# Patient Record
Sex: Male | Born: 1945 | Race: White | Hispanic: No | Smoking: Former smoker
Health system: Southern US, Community
[De-identification: ages and names within clinical notes are randomized; demographics above are authoritative.]

## PROBLEM LIST (undated history)

## (undated) DIAGNOSIS — K5790 Diverticulosis of intestine, part unspecified, without perforation or abscess without bleeding: Secondary | ICD-10-CM

## (undated) DIAGNOSIS — N200 Calculus of kidney: Secondary | ICD-10-CM

## (undated) DIAGNOSIS — K449 Diaphragmatic hernia without obstruction or gangrene: Secondary | ICD-10-CM

## (undated) DIAGNOSIS — I10 Essential (primary) hypertension: Secondary | ICD-10-CM

## (undated) DIAGNOSIS — N529 Male erectile dysfunction, unspecified: Secondary | ICD-10-CM

## (undated) DIAGNOSIS — K222 Esophageal obstruction: Secondary | ICD-10-CM

## (undated) DIAGNOSIS — D131 Benign neoplasm of stomach: Secondary | ICD-10-CM

## (undated) DIAGNOSIS — Z9862 Peripheral vascular angioplasty status: Secondary | ICD-10-CM

## (undated) DIAGNOSIS — K219 Gastro-esophageal reflux disease without esophagitis: Secondary | ICD-10-CM

## (undated) DIAGNOSIS — E559 Vitamin D deficiency, unspecified: Secondary | ICD-10-CM

## (undated) DIAGNOSIS — C439 Malignant melanoma of skin, unspecified: Secondary | ICD-10-CM

## (undated) DIAGNOSIS — I2510 Atherosclerotic heart disease of native coronary artery without angina pectoris: Secondary | ICD-10-CM

## (undated) DIAGNOSIS — H9190 Unspecified hearing loss, unspecified ear: Secondary | ICD-10-CM

## (undated) DIAGNOSIS — E785 Hyperlipidemia, unspecified: Secondary | ICD-10-CM

## (undated) DIAGNOSIS — K5732 Diverticulitis of large intestine without perforation or abscess without bleeding: Secondary | ICD-10-CM

## (undated) DIAGNOSIS — I4891 Unspecified atrial fibrillation: Secondary | ICD-10-CM

## (undated) DIAGNOSIS — Z22322 Carrier or suspected carrier of Methicillin resistant Staphylococcus aureus: Secondary | ICD-10-CM

## (undated) DIAGNOSIS — H7190 Unspecified cholesteatoma, unspecified ear: Secondary | ICD-10-CM

## (undated) HISTORY — DX: Peripheral vascular angioplasty status: Z98.62

## (undated) HISTORY — DX: Unspecified hearing loss, unspecified ear: H91.90

## (undated) HISTORY — PX: COLONOSCOPY: SHX174

## (undated) HISTORY — DX: Diverticulosis of intestine, part unspecified, without perforation or abscess without bleeding: K57.90

## (undated) HISTORY — DX: Diverticulitis of large intestine without perforation or abscess without bleeding: K57.32

## (undated) HISTORY — DX: Hyperlipidemia, unspecified: E78.5

## (undated) HISTORY — PX: ESOPHAGOGASTRODUODENOSCOPY: SHX1529

## (undated) HISTORY — DX: Unspecified cholesteatoma, unspecified ear: H71.90

## (undated) HISTORY — DX: Benign neoplasm of stomach: D13.1

## (undated) HISTORY — DX: Diaphragmatic hernia without obstruction or gangrene: K44.9

## (undated) HISTORY — DX: Esophageal obstruction: K22.2

## (undated) HISTORY — DX: Vitamin D deficiency, unspecified: E55.9

## (undated) HISTORY — DX: Malignant melanoma of skin, unspecified: C43.9

## (undated) HISTORY — DX: Carrier or suspected carrier of methicillin resistant Staphylococcus aureus: Z22.322

## (undated) HISTORY — DX: Male erectile dysfunction, unspecified: N52.9

## (undated) HISTORY — DX: Gastro-esophageal reflux disease without esophagitis: K21.9

## (undated) HISTORY — DX: Essential (primary) hypertension: I10

## (undated) HISTORY — DX: Unspecified atrial fibrillation: I48.91

## (undated) HISTORY — PX: TONSILLECTOMY AND ADENOIDECTOMY: SUR1326

## (undated) HISTORY — DX: Atherosclerotic heart disease of native coronary artery without angina pectoris: I25.10

## (undated) HISTORY — DX: Calculus of kidney: N20.0

---

## 1998-02-28 HISTORY — PX: CORONARY ANGIOPLASTY WITH STENT PLACEMENT: SHX49

## 2003-07-15 ENCOUNTER — Encounter: Admission: RE | Admit: 2003-07-15 | Discharge: 2003-07-15 | Payer: Self-pay | Admitting: Family Medicine

## 2003-08-08 ENCOUNTER — Encounter: Admission: RE | Admit: 2003-08-08 | Discharge: 2003-08-08 | Payer: Self-pay | Admitting: Internal Medicine

## 2005-12-30 ENCOUNTER — Encounter: Admission: RE | Admit: 2005-12-30 | Discharge: 2005-12-30 | Payer: Self-pay | Admitting: Otolaryngology

## 2007-02-15 ENCOUNTER — Encounter: Admission: RE | Admit: 2007-02-15 | Discharge: 2007-02-15 | Payer: Self-pay | Admitting: Otolaryngology

## 2008-09-17 ENCOUNTER — Encounter (INDEPENDENT_AMBULATORY_CARE_PROVIDER_SITE_OTHER): Payer: Self-pay | Admitting: *Deleted

## 2008-12-24 ENCOUNTER — Ambulatory Visit: Payer: Self-pay | Admitting: Cardiology

## 2008-12-24 DIAGNOSIS — E785 Hyperlipidemia, unspecified: Secondary | ICD-10-CM

## 2008-12-24 DIAGNOSIS — I10 Essential (primary) hypertension: Secondary | ICD-10-CM

## 2008-12-24 DIAGNOSIS — I2510 Atherosclerotic heart disease of native coronary artery without angina pectoris: Secondary | ICD-10-CM

## 2008-12-31 ENCOUNTER — Telehealth (INDEPENDENT_AMBULATORY_CARE_PROVIDER_SITE_OTHER): Payer: Self-pay

## 2009-01-01 ENCOUNTER — Encounter (HOSPITAL_COMMUNITY): Admission: RE | Admit: 2009-01-01 | Discharge: 2009-02-25 | Payer: Self-pay | Admitting: Cardiology

## 2009-01-01 ENCOUNTER — Ambulatory Visit: Payer: Self-pay

## 2009-01-01 ENCOUNTER — Ambulatory Visit: Payer: Self-pay | Admitting: Internal Medicine

## 2009-02-28 HISTORY — PX: COLON SURGERY: SHX602

## 2009-05-29 ENCOUNTER — Encounter: Admission: RE | Admit: 2009-05-29 | Discharge: 2009-05-29 | Payer: Self-pay | Admitting: Family Medicine

## 2009-05-29 ENCOUNTER — Inpatient Hospital Stay (HOSPITAL_COMMUNITY): Admission: EM | Admit: 2009-05-29 | Discharge: 2009-05-31 | Payer: Self-pay | Admitting: Emergency Medicine

## 2009-06-05 ENCOUNTER — Encounter: Admission: RE | Admit: 2009-06-05 | Discharge: 2009-06-05 | Payer: Self-pay | Admitting: General Surgery

## 2009-06-23 ENCOUNTER — Encounter: Admission: RE | Admit: 2009-06-23 | Discharge: 2009-06-23 | Payer: Self-pay | Admitting: General Surgery

## 2009-07-29 DIAGNOSIS — Z22322 Carrier or suspected carrier of Methicillin resistant Staphylococcus aureus: Secondary | ICD-10-CM

## 2009-07-29 HISTORY — DX: Carrier or suspected carrier of methicillin resistant Staphylococcus aureus: Z22.322

## 2009-08-07 ENCOUNTER — Inpatient Hospital Stay (HOSPITAL_COMMUNITY): Admission: RE | Admit: 2009-08-07 | Discharge: 2009-08-11 | Payer: Self-pay | Admitting: General Surgery

## 2009-08-07 ENCOUNTER — Encounter (INDEPENDENT_AMBULATORY_CARE_PROVIDER_SITE_OTHER): Payer: Self-pay | Admitting: General Surgery

## 2009-11-24 ENCOUNTER — Encounter: Admission: RE | Admit: 2009-11-24 | Discharge: 2009-11-24 | Payer: Self-pay | Admitting: Otolaryngology

## 2009-12-29 LAB — HM MAMMOGRAPHY

## 2010-01-20 ENCOUNTER — Telehealth (INDEPENDENT_AMBULATORY_CARE_PROVIDER_SITE_OTHER): Payer: Self-pay | Admitting: *Deleted

## 2010-02-28 HISTORY — PX: MELANOMA EXCISION: SHX5266

## 2010-04-01 NOTE — Progress Notes (Signed)
Summary: Records Request  Faxed Stress to [facility] at [facility] ([phone]). Debby Freiberg  January 20, 2010 9:30 AM

## 2010-05-17 LAB — CBC
HCT: 45.5 % (ref 39.0–52.0)
MCHC: 33.7 g/dL (ref 30.0–36.0)
MCV: 93.1 fL (ref 78.0–100.0)
Platelets: 181 10*3/uL (ref 150–400)
RBC: 4.01 MIL/uL — ABNORMAL LOW (ref 4.22–5.81)
RDW: 14.1 % (ref 11.5–15.5)
RDW: 14.8 % (ref 11.5–15.5)
WBC: 9.3 10*3/uL (ref 4.0–10.5)

## 2010-05-17 LAB — DIFFERENTIAL
Basophils Relative: 0 % (ref 0–1)
Eosinophils Relative: 0 % (ref 0–5)
Eosinophils Relative: 1 % (ref 0–5)
Lymphocytes Relative: 29 % (ref 12–46)
Lymphocytes Relative: 7 % — ABNORMAL LOW (ref 12–46)
Lymphs Abs: 0.9 10*3/uL (ref 0.7–4.0)
Lymphs Abs: 2.7 10*3/uL (ref 0.7–4.0)
Monocytes Absolute: 0.7 10*3/uL (ref 0.1–1.0)
Monocytes Relative: 7 % (ref 3–12)
Neutro Abs: 5.9 10*3/uL (ref 1.7–7.7)
Neutrophils Relative %: 63 % (ref 43–77)
Neutrophils Relative %: 86 % — ABNORMAL HIGH (ref 43–77)

## 2010-05-17 LAB — COMPREHENSIVE METABOLIC PANEL
ALT: 40 U/L (ref 0–53)
Alkaline Phosphatase: 54 U/L (ref 39–117)
Calcium: 10 mg/dL (ref 8.4–10.5)
Chloride: 100 mEq/L (ref 96–112)
GFR calc Af Amer: >60 mL/min (ref >60)
Potassium: 3.9 mEq/L (ref 3.5–5.1)
Sodium: 138 mEq/L (ref 135–145)
Total Bilirubin: 1 mg/dL (ref 0.3–1.2)

## 2010-05-17 LAB — BASIC METABOLIC PANEL
BUN: 7 mg/dL (ref 6–23)
CO2: 30 mEq/L (ref 19–32)
Calcium: 9.1 mg/dL (ref 8.4–10.5)
Creatinine, Ser: 0.79 mg/dL (ref 0.4–1.5)
Potassium: 4.1 mEq/L (ref 3.5–5.1)
Sodium: 141 mEq/L (ref 135–145)

## 2010-05-17 LAB — SURGICAL PCR SCREEN: Staphylococcus aureus: POSITIVE — AB

## 2010-05-19 LAB — URINALYSIS, ROUTINE W REFLEX MICROSCOPIC
Glucose, UA: NEGATIVE mg/dL
Protein, ur: NEGATIVE mg/dL
Specific Gravity, Urine: >1.046 — ABNORMAL HIGH (ref 1.005–1.030)
Urobilinogen, UA: 0.2 mg/dL (ref 0.0–1.0)
pH: 5 (ref 5.0–8.0)

## 2010-05-19 LAB — CBC
HCT: 38.5 % — ABNORMAL LOW (ref 39.0–52.0)
HCT: 42.8 % (ref 39.0–52.0)
Hemoglobin: 14.6 g/dL (ref 13.0–17.0)
MCHC: 34.2 g/dL (ref 30.0–36.0)
MCV: 92.6 fL (ref 78.0–100.0)
Platelets: 263 10*3/uL (ref 150–400)
Platelets: 292 10*3/uL (ref 150–400)
RBC: 4.67 MIL/uL (ref 4.22–5.81)
RDW: 13 % (ref 11.5–15.5)

## 2010-05-19 LAB — BASIC METABOLIC PANEL
BUN: 8 mg/dL (ref 6–23)
CO2: 27 mEq/L (ref 19–32)
Chloride: 101 mEq/L (ref 96–112)
GFR calc Af Amer: >60 mL/min (ref >60)
Glucose, Bld: 146 mg/dL — ABNORMAL HIGH (ref 70–99)
Potassium: 4.2 mEq/L (ref 3.5–5.1)
Sodium: 134 mEq/L — ABNORMAL LOW (ref 135–145)

## 2010-05-19 LAB — COMPREHENSIVE METABOLIC PANEL
BUN: 9 mg/dL (ref 6–23)
CO2: 23 mEq/L (ref 19–32)
Calcium: 9.8 mg/dL (ref 8.4–10.5)
Chloride: 102 mEq/L (ref 96–112)
Creatinine, Ser: 0.86 mg/dL (ref 0.4–1.5)

## 2010-05-19 LAB — DIFFERENTIAL
Basophils Absolute: 0 10*3/uL (ref 0.0–0.1)
Basophils Relative: 0 % (ref 0–1)
Eosinophils Absolute: 0 10*3/uL (ref 0.0–0.7)
Eosinophils Absolute: 0.1 10*3/uL (ref 0.0–0.7)
Eosinophils Relative: 0 % (ref 0–5)
Eosinophils Relative: 1 % (ref 0–5)
Lymphocytes Relative: 15 % (ref 12–46)
Lymphs Abs: 1.3 10*3/uL (ref 0.7–4.0)
Lymphs Abs: 2 10*3/uL (ref 0.7–4.0)
Monocytes Absolute: 1 10*3/uL (ref 0.1–1.0)
Monocytes Relative: 7 % (ref 3–12)
Neutro Abs: 10.6 10*3/uL — ABNORMAL HIGH (ref 1.7–7.7)

## 2010-05-19 LAB — CULTURE, BLOOD (ROUTINE X 2): Culture: NO GROWTH

## 2010-05-20 ENCOUNTER — Ambulatory Visit: Payer: Self-pay | Admitting: Physical Therapy

## 2010-06-04 ENCOUNTER — Other Ambulatory Visit: Payer: Self-pay

## 2010-07-05 ENCOUNTER — Encounter: Payer: Self-pay | Admitting: Family Medicine

## 2011-03-01 DIAGNOSIS — C439 Malignant melanoma of skin, unspecified: Secondary | ICD-10-CM

## 2011-03-01 HISTORY — DX: Malignant melanoma of skin, unspecified: C43.9

## 2011-10-07 ENCOUNTER — Encounter: Payer: Self-pay | Admitting: Internal Medicine

## 2011-11-09 ENCOUNTER — Ambulatory Visit (INDEPENDENT_AMBULATORY_CARE_PROVIDER_SITE_OTHER): Payer: BC Managed Care – PPO | Admitting: Internal Medicine

## 2011-11-09 ENCOUNTER — Encounter: Payer: Self-pay | Admitting: Internal Medicine

## 2011-11-09 VITALS — BP 132/78 | HR 76 | Ht 71.0 in | Wt 200.8 lb

## 2011-11-09 DIAGNOSIS — R195 Other fecal abnormalities: Secondary | ICD-10-CM

## 2011-11-09 DIAGNOSIS — D509 Iron deficiency anemia, unspecified: Secondary | ICD-10-CM

## 2011-11-09 MED ORDER — NA SULFATE-K SULFATE-MG SULF 17.5-3.13-1.6 GM/177ML PO SOLN: ORAL | Status: DC

## 2011-11-09 MED ORDER — FERROUS SULFATE 325 (65 FE) MG PO TABS: 325.0000 mg | ORAL_TABLET | Freq: Every day | ORAL | Status: DC

## 2011-11-09 NOTE — Progress Notes (Signed)
Subjective:    Patient ID: Maurice Weaver, male    DOB: Sep 05, 1945, 66 y.o.   MRN: [id number]  HPI This pleasant elderly white man is here today because of anemia and heme positive stool. She was found to have a hemoglobin of 11.9 this summer, I ferritin was 5, MCV 78. He had an immune fecal occult blood test that was positive. He denies any rectal bleeding or melena. He has been doing well other than his chronic hearing loss. He continues to work. He has not yet started on iron. He does not eat much red meat. He is not a blood donor. He is on a daily aspirin as well as omeprazole. He does not report nonsteroidal.   He does have urgent postprandial defecation and loose stools chronically ever since a left sided colon resection for diverticulitis and abscess. He says this does not impair his quality of life.  GI ROS otherwise negative. Allergies  Allergen Reactions  . Ampicillin Nausea Only   Outpatient Prescriptions Prior to Visit  Medication Sig Dispense Refill  . aspirin (LONGS ADULT LOW STRENGTH ASA) 81 MG EC tablet Take 81 mg by mouth daily.        . cholecalciferol (VITAMIN D) 1000 UNITS tablet Take 1,000 Units by mouth daily.        Marland Kitchen ezetimibe (ZETIA) 10 MG tablet Take 10 mg by mouth at bedtime.        . hydrochlorothiazide 25 MG tablet Take 25 mg by mouth daily.        . metoprolol (TOPROL XL) 100 MG 24 hr tablet Take 100 mg by mouth daily.        Marland Kitchen omeprazole (PRILOSEC) 10 MG capsule Take 10 mg by mouth at bedtime.        . potassium chloride (KLOR-CON) 10 MEQ CR tablet Take 10 mEq by mouth daily.        . ramipril (ALTACE) 10 MG capsule Take 10 mg by mouth daily.        . rosuvastatin (CRESTOR) 20 MG tablet Take 20 mg by mouth at bedtime.        Marland Kitchen zolpidem (AMBIEN) 10 MG tablet Take 10 mg by mouth at bedtime as needed.         Past Medical History  Diagnosis Date  . Hiatal hernia   . Diverticulosis   . Hyperlipidemia   . AF (atrial fibrillation)   . Asthma     stable    . Decreased hearing   . Nephrolithiasis   . CAD (coronary artery disease)   . ED (erectile dysfunction)   . Elevated BP   . History of ETT 12/1993  . Decreased radial pulse   . Cholesteatoma   . S/P angioplasty   . GERD (gastroesophageal reflux disease)   . Fundic gland polyps of stomach, benign   . Schatzki's ring   . MRSA (methicillin resistant staph aureus) culture positive 07/2009  . Diverticulitis of colon     recurrent w/abscess - resected 2011  . Melanoma    Past Surgical History  Procedure Date  . Colonoscopy 2005  . Esophagogastroduodenoscopy 2005  . Tonsillectomy and adenoidectomy   . Coronary angioplasty with stent placement 2000  . Inner ear surgery     x5 cholesteatomas  . Colon surgery 2011    diverticulitis left resection-Toth  . Melanoma excision 2012   History   Social History  . Marital Status: Divorced    Spouse Name: N/A  Number of Children: N/A  . Years of Education: N/A   Occupational History  . Electronics    Social History Main Topics  . Smoking status: Former Games developer  . Smokeless tobacco: Never Used  . Alcohol Use: No  . Drug Use: No  . Sexually Active: None   Other Topics Concern  . None   Social History Narrative   Divorced and lives aloneDoes have a daughter in the areaEmployed at T/E electronicsNo caffeine as of 11/09/2011   Family History  Problem Relation Age of Onset  . Colon cancer Neg Hx   . Pancreatic cancer Mother   . Heart disease Father      Review of Systems This is positive for chronically reduced hearing, he says he is about 50% hearing ability in the last year and nontender right. All other review of systems are reviewed and are negative except as mentioned in the history of present illness.    Objective:   Physical Exam General:  Well-developed, well-nourished and in no acute distress Eyes:  anicteric. ENT:   Mouth and posterior pharynx free of lesions.  Neck:   supple w/o thyromegaly or mass.   Lungs: Clear to auscultation bilaterally. Heart:  S1S2, no rubs, murmurs, gallops. Abdomen:  soft, non-tender, no hepatosplenomegaly, hernia, or mass and BS+.  Rectal: Deferred until colonoscopy Lymph:  no cervical or supraclavicular adenopathy. Extremities:   no edema Skin   no rash. Neuro:  A&O x 3. He does have reduced hearing, death on the right Psych:  appropriate mood and  Affect.   Data Reviewed: Studies as reported above these are from July 2013. A sick metabolic panel was normal. 2005 EGD showed hiatal hernia and fundic gland polyps Colonoscopy then showed diverticulosis and internal hemorrhoids    Assessment & Plan:   1. Heme positive stool-iFOBT   2. Iron deficiency anemia    1. Etiology not clear. With the heme positive stool certainly possible that he has a chronic occult blood loss anemia. 2. Plan for colonoscopy, would do that exam first and if does not explain his problem upper endoscopy will be done. Will schedule for both.The risks and benefits as well as alternatives of endoscopic procedure(s) have been discussed and reviewed. All questions answered. The patient agrees to proceed. 3. Start ferrous sulfate 325 mg daily 4. He is very busy at work and needs to wait a few weeks before scheduling, we'll schedule this for October 5. Anticipate followup hemoglobin then 6. If EGD and colonoscopy are unrevealing, only to decide versus supplementation and followup or proceeding with a capsule endoscopy. We briefly discussed this today but no decisions are made.  I appreciate the opportunity to care for this patient.   CC: Rudi Heap, MD

## 2011-11-09 NOTE — Patient Instructions (Addendum)
You have been scheduled for an endoscopy and colonoscopy with propofol. Please follow the written instructions given to you at your visit today. Please pick up your prep at the pharmacy within the next 1-3 days. If you use inhalers (even only as needed), please bring them with you on the day of your procedure.  Please start Ferrous Sulfate 325 mg , take one every day.  This is over the counter.  Thank you for choosing me and [facility].  Iva Boop, M.D., [facility]

## 2011-12-29 ENCOUNTER — Encounter: Payer: Self-pay | Admitting: Internal Medicine

## 2011-12-29 ENCOUNTER — Ambulatory Visit (AMBULATORY_SURGERY_CENTER): Payer: BC Managed Care – PPO | Admitting: Internal Medicine

## 2011-12-29 VITALS — BP 135/76 | HR 67 | Temp 98.3°F | Resp 27 | Ht 71.0 in | Wt 200.0 lb

## 2011-12-29 DIAGNOSIS — D509 Iron deficiency anemia, unspecified: Secondary | ICD-10-CM

## 2011-12-29 DIAGNOSIS — D126 Benign neoplasm of colon, unspecified: Secondary | ICD-10-CM

## 2011-12-29 DIAGNOSIS — K2990 Gastroduodenitis, unspecified, without bleeding: Secondary | ICD-10-CM

## 2011-12-29 DIAGNOSIS — K2970 Gastritis, unspecified, without bleeding: Secondary | ICD-10-CM

## 2011-12-29 DIAGNOSIS — R195 Other fecal abnormalities: Secondary | ICD-10-CM

## 2011-12-29 MED ORDER — SODIUM CHLORIDE 0.9 % IV SOLN: 500.0000 mL | INTRAVENOUS | Status: DC

## 2011-12-29 MED ORDER — OMEPRAZOLE 20 MG PO CPDR: 20.0000 mg | DELAYED_RELEASE_CAPSULE | Freq: Every day | ORAL | Status: DC

## 2011-12-29 NOTE — Progress Notes (Signed)
1102 a/ox3, pleased, report to [employer]

## 2011-12-29 NOTE — Op Note (Signed)
[facility] [address] [place], [address]   ENDOSCOPY PROCEDURE REPORT  PATIENT: Teal, Bontrager  MR#: [id number] BIRTHDATE: 15-Feb-1946 , 65  yrs. old GENDER: Male ENDOSCOPIST: Iva Boop, MD, [facility] REFERRED BY:  Rudi Heap, M.D. PROCEDURE DATE:  12/29/2011 PROCEDURE:  EGD w/ biopsy ASA CLASS:     Class II INDICATIONS:  iron deficiency anemia. MEDICATIONS: There was residual sedation effect present from prior procedure, propofol (Diprivan) 50mg  IV, MAC sedation, administered by CRNA, and These medications were titrated to patient response per physician's verbal order TOPICAL ANESTHETIC: Cetacaine Spray  DESCRIPTION OF PROCEDURE: After the risks benefits and alternatives of the procedure were thoroughly explained, informed consent was obtained.  The [facility] [id number] endoscope was introduced through the mouth and advanced to the second portion of the duodenum. Without limitations.  The instrument was slowly withdrawn as the mucosa was fully examined.      STOMACH: Mild erosive gastritis (inflammation) was found in the gastric antrum. Linear erythema with subtle mucosal disruption. Multiple biopsies were performed using cold forceps.  Sample sent for histology.  The remainder of the upper endoscopy exam was otherwise normal. Retroflexed views revealed no abnormalities.     The scope was then withdrawn from the patient and the procedure completed.  COMPLICATIONS: There were no complications. ENDOSCOPIC IMPRESSION: 1.   Erosive gastritis (inflammation) was found in the gastric antrum; multiple biopsies 2.   The remainder of the upper endoscopy exam was otherwise normal  RECOMMENDATIONS: 1.  continue PPI but increase to 20 mg omeprazole daily 2.  Await pathology results 3.  Follow-up with Dr. Christell Constant re: anemia    eSigned:  Iva Boop, MD, [facility] 12/29/2011 11:07 AM PP:IRJJOA Christell Constant, MD and The Patient

## 2011-12-29 NOTE — Patient Instructions (Addendum)
There was a very tiny polyp removed from the colon and some small internal hemorrhoids. Otherwise ok.  The upper endoscopy showed an inflamed stomach - called gastritis. Biopsies were taken to understand it better. i am going to increase your dose of omeprazole to 20 mg daily.   I will let you know the results and plans via phone call or letter.  Please follow-up with Dr. Kathi Der office regarding your anemia.  Thank you for choosing me and [facility].  Iva Boop, MD, FACG YOU HAD AN ENDOSCOPIC PROCEDURE TODAY AT THE [facility]: Refer to the procedure report that was given to you for any specific questions about what was found during the examination.  If the procedure report does not answer your questions, please call your gastroenterologist to clarify.  If you requested that your care partner not be given the details of your procedure findings, then the procedure report has been included in a sealed envelope for you to review at your convenience later.  YOU SHOULD EXPECT: Some feelings of bloating in the abdomen. Passage of more gas than usual.  Walking can help get rid of the air that was put into your GI tract during the procedure and reduce the bloating. If you had a lower endoscopy (such as a colonoscopy or flexible sigmoidoscopy) you may notice spotting of blood in your stool or on the toilet paper. If you underwent a bowel prep for your procedure, then you may not have a normal bowel movement for a few days.  DIET: Your first meal following the procedure should be a light meal and then it is ok to progress to your normal diet.  A half-sandwich or bowl of soup is an example of a good first meal.  Heavy or fried foods are harder to digest and may make you feel nauseous or bloated.  Likewise meals heavy in dairy and vegetables can cause extra gas to form and this can also increase the bloating.  Drink plenty of fluids but you should avoid alcoholic beverages for 24  hours.  ACTIVITY: Your care partner should take you home directly after the procedure.  You should plan to take it easy, moving slowly for the rest of the day.  You can resume normal activity the day after the procedure however you should NOT DRIVE or use heavy machinery for 24 hours (because of the sedation medicines used during the test).    SYMPTOMS TO REPORT IMMEDIATELY: A gastroenterologist can be reached at any hour.  During normal business hours, 8:30 AM to 5:00 PM Monday through Friday, call [phone].  After hours and on weekends, please call the GI answering service at [phone] who will take a message and have the physician on call contact you.   Following lower endoscopy (colonoscopy or flexible sigmoidoscopy):  Excessive amounts of blood in the stool  Significant tenderness or worsening of abdominal pains  Swelling of the abdomen that is new, acute  Fever of 100F or higher  Following upper endoscopy (EGD)  Vomiting of blood or coffee ground material  New chest pain or pain under the shoulder blades  Painful or persistently difficult swallowing  New shortness of breath  Fever of 100F or higher  Black, tarry-looking stools  FOLLOW UP: If any biopsies were taken you will be contacted by phone or by letter within the next 1-3 weeks.  Call your gastroenterologist if you have not heard about the biopsies in 3 weeks.  Our staff will call  the home number listed on your records the next business day following your procedure to check on you and address any questions or concerns that you may have at that time regarding the information given to you following your procedure. This is a courtesy call and so if there is no answer at the home number and we have not heard from you through the emergency physician on call, we will assume that you have returned to your regular daily activities without incident.  SIGNATURES/CONFIDENTIALITY: You and/or your care partner have signed  paperwork which will be entered into your electronic medical record.  These signatures attest to the fact that that the information above on your After Visit Summary has been reviewed and is understood.  Full responsibility of the confidentiality of this discharge information lies with you and/or your care-partner.

## 2011-12-29 NOTE — Progress Notes (Addendum)
Patient did not have preoperative order for IV antibiotic SSI prophylaxis. (G8918)  Patient did not experience any of the following events: a burn prior to discharge; a fall within the facility; wrong site/side/patient/procedure/implant event; or a hospital transfer or hospital admission upon discharge from the facility. (G8907)  

## 2011-12-29 NOTE — Op Note (Addendum)
[facility] [address] [place], [address]   COLONOSCOPY PROCEDURE REPORT  PATIENT: Maurice, Weaver  MR#: [id number] BIRTHDATE: Feb 21, 1946 , 65  yrs. old GENDER: Male ENDOSCOPIST: Iva Boop, MD, [facility] REFERRED XB:JYNWGN Christell Constant, M.D. PROCEDURE DATE:  12/29/2011 PROCEDURE:   Colonoscopy with biopsy ASA CLASS:   Class II INDICATIONS:iron deficiency anemia and heme-positive stool. MEDICATIONS: propofol (Diprivan) 150mg  IV, MAC sedation, administered by CRNA, and These medications were titrated to patient response per physician's verbal order  DESCRIPTION OF PROCEDURE:   After the risks benefits and alternatives of the procedure were thoroughly explained, informed consent was obtained.  A digital rectal exam revealed no abnormalities of the rectum and A digital rectal exam revealed the prostate was not enlarged.   The LB [id number]  endoscope was introduced through the anus and advanced to the terminal ileum which was intubated for a short distance. No adverse events experienced.   The quality of the prep was Suprep excellent  The instrument was then slowly withdrawn as the colon was fully examined.      COLON FINDINGS: A diminutive polypoid shaped sessile polyp with a friable surface was found in the sigmoid colon.  A polypectomy was performed with cold forceps.  The resection was complete and the polyp tissue was completely retrieved.   There was evidence of a prior end-to-end colo-colonic surgical anastomosis in the sigmoid colon.   Small internal hemorrhoids were found.   The colon mucosa was otherwise normal and so was terminal ileum.  Retroflexed views revealed internal hemorrhoids. The time to cecum=1 minutes 05 seconds.  Withdrawal time=7 minutes 11 seconds.  The scope was withdrawn and the procedure completed. COMPLICATIONS: There were no complications.  ENDOSCOPIC IMPRESSION: 1.   Diminutive sessile polyp was found in the sigmoid  colon; polypectomy was performed with cold forceps 2.   There was evidence of a prior colo-colonic surgical anastomosis in the sigmoid colon 3.   Small internal hemorrhoids 4.   The colon mucosa and terminal ileum were otherwise normal - excellent prep  RECOMMENDATIONS: 1.  Upper endoscopy will be scheduled 2.  Timing of repeat colonoscopy will be determined by pathology findings.   eSigned:  Iva Boop, MD, [facility] 12/29/2011 11:39 AMRevised: 12/29/2011 11:39 AM cc: Rudi Heap, MD and The Patient

## 2011-12-30 ENCOUNTER — Telehealth: Payer: Self-pay | Admitting: *Deleted

## 2011-12-30 ENCOUNTER — Telehealth: Payer: Self-pay

## 2011-12-30 MED ORDER — OMEPRAZOLE 40 MG PO CPDR: 40.0000 mg | DELAYED_RELEASE_CAPSULE | Freq: Every day | ORAL | Status: DC

## 2011-12-30 NOTE — Telephone Encounter (Signed)
We need to change his omeprazole to 40 mg daily - I thought he was on 10 and went to 20 so please cancel the 20 Rx and do 40 mg daily x 1 year

## 2011-12-30 NOTE — Telephone Encounter (Signed)
  Follow up Call-  Call back number 12/29/2011  Post procedure Call Back phone  # [phone]  Permission to leave phone message Yes     Patient questions:  Do you have a fever, pain , or abdominal swelling? no Pain Score  0 *  Have you tolerated food without any problems? yes  Have you been able to return to your normal activities? yes  Do you have any questions about your discharge instructions: Diet   no Medications  no Follow up visit  no  Do you have questions or concerns about your Care? no  Actions: * If pain score is 4 or above: No action needed, pain <4.  Dr.Gessner, Patient states his current dose of omeprazole that he is taking is 20 mg. Patient wants you to know. Thanks .

## 2011-12-30 NOTE — Telephone Encounter (Signed)
Informed patient of omeprazole change in dosage.  He wants it sent to [facility] so will cancel the [facility] order and resend.  Cancelled the [facility] rx with Kerri at 12:20pm.

## 2012-01-03 NOTE — Progress Notes (Signed)
Quick Note:  Mild gastritis Granuloma polyp  ______

## 2012-06-13 ENCOUNTER — Encounter: Payer: Self-pay | Admitting: Family Medicine

## 2012-06-13 ENCOUNTER — Ambulatory Visit (INDEPENDENT_AMBULATORY_CARE_PROVIDER_SITE_OTHER): Payer: Medicare Other | Admitting: Family Medicine

## 2012-06-13 VITALS — BP 146/91 | HR 71 | Temp 97.3°F | Ht 71.0 in | Wt 196.0 lb

## 2012-06-13 DIAGNOSIS — E291 Testicular hypofunction: Secondary | ICD-10-CM

## 2012-06-13 DIAGNOSIS — E559 Vitamin D deficiency, unspecified: Secondary | ICD-10-CM

## 2012-06-13 DIAGNOSIS — I10 Essential (primary) hypertension: Secondary | ICD-10-CM

## 2012-06-13 DIAGNOSIS — E349 Endocrine disorder, unspecified: Secondary | ICD-10-CM

## 2012-06-13 DIAGNOSIS — E785 Hyperlipidemia, unspecified: Secondary | ICD-10-CM

## 2012-06-13 DIAGNOSIS — H9190 Unspecified hearing loss, unspecified ear: Secondary | ICD-10-CM | POA: Insufficient documentation

## 2012-06-13 DIAGNOSIS — D0330 Melanoma in situ of unspecified part of face: Secondary | ICD-10-CM | POA: Insufficient documentation

## 2012-06-13 DIAGNOSIS — I2510 Atherosclerotic heart disease of native coronary artery without angina pectoris: Secondary | ICD-10-CM

## 2012-06-13 DIAGNOSIS — D509 Iron deficiency anemia, unspecified: Secondary | ICD-10-CM

## 2012-06-13 DIAGNOSIS — C4330 Malignant melanoma of unspecified part of face: Secondary | ICD-10-CM

## 2012-06-13 DIAGNOSIS — H9193 Unspecified hearing loss, bilateral: Secondary | ICD-10-CM

## 2012-06-13 DIAGNOSIS — D0339 Melanoma in situ of other parts of face: Secondary | ICD-10-CM

## 2012-06-13 LAB — HEPATIC FUNCTION PANEL
ALT: 18 U/L (ref 0–53)
AST: 19 U/L (ref 0–37)
Albumin: 4.6 g/dL (ref 3.5–5.2)
Alkaline Phosphatase: 60 U/L (ref 39–117)
Total Protein: 7 g/dL (ref 6.0–8.3)

## 2012-06-13 LAB — BASIC METABOLIC PANEL WITH GFR
BUN: 16 mg/dL (ref 6–23)
CO2: 26 mEq/L (ref 19–32)
Chloride: 100 mEq/L (ref 96–112)
Creat: 0.94 mg/dL (ref 0.50–1.35)
GFR, Est Non African American: 84 mL/min
Glucose, Bld: 90 mg/dL (ref 70–99)

## 2012-06-13 LAB — POCT CBC
HCT, POC: 44.4 % (ref 43.5–53.7)
Lymph, poc: 2.4 (ref 0.6–3.4)
MCH, POC: 31.8 pg — AB (ref 27–31.2)
MCHC: 34.4 g/dL (ref 31.8–35.4)
MPV: 7.3 fL (ref 0–99.8)
POC Granulocyte: 4.4 (ref 2–6.9)
POC LYMPH PERCENT: 32.8 %L (ref 10–50)
RDW, POC: 12.6 %
WBC: 7.3 10*3/uL (ref 4.6–10.2)

## 2012-06-13 NOTE — Progress Notes (Signed)
  Subjective:    Patient ID: Maurice Weaver, male    DOB: 1945-07-13, 67 y.o.   MRN: [id number]  HPI Patient had an endoscopy in October and was found to have erosive gastritis. He also had a colonoscopy. Based on the path report and the lack of family history of colon cancer he probably will need his next colonoscopy in 10 years.   Review of Systems  Constitutional: Negative.   HENT: Positive for hearing loss (Chronic-treated by Dr. Jenne Pane). Negative for ear pain, congestion and rhinorrhea.   Eyes: Negative.   Respiratory: Negative.   Cardiovascular: Negative.   Gastrointestinal: Negative.  Negative for diarrhea (Resolved from previous visit).  Endocrine: Negative.   Genitourinary: Negative.   Musculoskeletal: Negative.   Skin: Negative.   Allergic/Immunologic: Negative.   Neurological: Negative.   Hematological: Negative.   Psychiatric/Behavioral: Negative.        Objective:   Physical Exam BP 146/91  Pulse 71  Temp(Src) 97.3 F (36.3 C) (Oral)  Ht 5\' 11"  (1.803 m)  Wt 196 lb (88.905 kg)  BMI 27.35 kg/m2  The patient appeared well nourished and normally developed, alert and oriented to time and place. Speech, behavior and judgement appear normal. Diminished hearing bilaterally Vital signs as documented. Patient sees Dr. Jenne Pane regularly for his hearing. Head exam is unremarkable. No scleral icterus or pallor noted. There is some head congestion bilateral.  Neck is without jugular venous distension, thyromegally, or carotid bruits. Carotid upstrokes are brisk bilaterally. No cervical adenopathy. Lungs are clear anteriorly and posteriorly to auscultation. Normal respiratory effort. Cardiac exam reveals regular rate and rhythm @ 72/min. First and second heart sounds normal. No murmurs, rubs or gallops.  Abdominal exam reveals normal bowl sounds, no masses, no organomegaly and no aortic enlargement. No inguinal adenopathy. Extremities are nonedematous and both femoral and  pedal pulses are normal. There are some varicose veins bilaterally in the lower extremities Skin without pallor or jaundice.  Warm and dry, without rash. Scar noted on right cheek secondary to melanoma removal. Neurologic exam reveals normal deep tendon reflexes and normal sensation.   Repeat blood pressure was 130/84          Assessment & Plan:  1. Melanoma in situ of cheek Dr. Terri Piedra follows up on the [facility]   2. Hearing deficit, bilateral Dr. Jenne Pane follows him for his hearing issue  3. Coronary atherosclerosis of native coronary artery He is having no chest pain or chest tightness or shortness of breath with exertion  4. Hyperlipidemia Labs will be drawn today  5. Vitamin D deficient  6.hypertension     Labs are being drawn to monitor these above condition;  CBC, ferritin, BMP, NMR, liver function test, and vitamin D. Also testosterone level

## 2012-06-13 NOTE — Patient Instructions (Addendum)
Exercise regularly Fall prevention Continue meds as doing We will see back in the office in about 4 month and we will call you with lab results from this visit Check blood pressures regularly at home and bring these to the next visit

## 2012-06-13 NOTE — Progress Notes (Signed)
Spoke with Delice Bison and gave normal results to her and also left a message for [employer]

## 2012-06-14 LAB — NMR LIPOPROFILE WITH LIPIDS
Cholesterol, Total: 120 mg/dL (ref <200)
HDL Particle Number: 27.7 umol/L — ABNORMAL LOW (ref >=30.5)
LDL Size: 19.9 nm — ABNORMAL LOW (ref >20.5)
Large HDL-P: 2.9 umol/L — ABNORMAL LOW (ref >=4.8)
Large VLDL-P: 2.8 nmol/L — ABNORMAL HIGH (ref <=2.7)
Small LDL Particle Number: 892 nmol/L — ABNORMAL HIGH (ref <=527)

## 2012-06-14 LAB — VITAMIN D 25 HYDROXY (VIT D DEFICIENCY, FRACTURES): Vit D, 25-Hydroxy: 29 ng/mL — ABNORMAL LOW (ref 30–89)

## 2012-06-21 ENCOUNTER — Telehealth: Payer: Self-pay | Admitting: *Deleted

## 2012-06-21 NOTE — Telephone Encounter (Signed)
Message copied by Bearl Mulberry on Thu Jun 21, 2012  6:45 PM ------      Message from: Ernestina Penna      Created: Thu Jun 21, 2012 12:15 PM       Do AndroGel 1.62% one pump each arm daily ------

## 2012-06-21 NOTE — Telephone Encounter (Signed)
LMOM

## 2012-06-22 ENCOUNTER — Telehealth: Payer: Self-pay | Admitting: *Deleted

## 2012-06-22 MED ORDER — TESTOSTERONE 20.25 MG/1.25GM (1.62%) TD GEL: 1.0000 "application " | Freq: Every day | TRANSDERMAL | Status: DC

## 2012-06-22 NOTE — Telephone Encounter (Signed)
Message copied by Bearl Mulberry on Fri Jun 22, 2012 10:27 AM ------      Message from: Ernestina Penna      Created: Thu Jun 21, 2012 12:15 PM       Do AndroGel 1.62% one pump each arm daily ------

## 2012-06-25 ENCOUNTER — Telehealth: Payer: Self-pay | Admitting: Family Medicine

## 2012-06-25 NOTE — Telephone Encounter (Signed)
LMOM for pt and called Androgel to [facility] vm.

## 2012-10-15 ENCOUNTER — Ambulatory Visit (INDEPENDENT_AMBULATORY_CARE_PROVIDER_SITE_OTHER): Payer: Medicare Other | Admitting: Family Medicine

## 2012-10-15 ENCOUNTER — Encounter: Payer: Self-pay | Admitting: Family Medicine

## 2012-10-15 VITALS — BP 134/85 | HR 77 | Temp 97.6°F | Ht 71.0 in | Wt 191.2 lb

## 2012-10-15 DIAGNOSIS — E785 Hyperlipidemia, unspecified: Secondary | ICD-10-CM

## 2012-10-15 DIAGNOSIS — K219 Gastro-esophageal reflux disease without esophagitis: Secondary | ICD-10-CM

## 2012-10-15 DIAGNOSIS — E291 Testicular hypofunction: Secondary | ICD-10-CM

## 2012-10-15 DIAGNOSIS — H9193 Unspecified hearing loss, bilateral: Secondary | ICD-10-CM

## 2012-10-15 DIAGNOSIS — D509 Iron deficiency anemia, unspecified: Secondary | ICD-10-CM

## 2012-10-15 DIAGNOSIS — E349 Endocrine disorder, unspecified: Secondary | ICD-10-CM

## 2012-10-15 DIAGNOSIS — H9190 Unspecified hearing loss, unspecified ear: Secondary | ICD-10-CM

## 2012-10-15 DIAGNOSIS — E559 Vitamin D deficiency, unspecified: Secondary | ICD-10-CM

## 2012-10-15 DIAGNOSIS — I10 Essential (primary) hypertension: Secondary | ICD-10-CM

## 2012-10-15 LAB — POCT CBC
Granulocyte percent: 70.9 %G (ref 37–80)
HCT, POC: 51.9 % (ref 43.5–53.7)
Hemoglobin: 17.3 g/dL (ref 14.1–18.1)
MCV: 91.3 fL (ref 80–97)
POC Granulocyte: 5.5 (ref 2–6.9)
RDW, POC: 12.8 %
WBC: 7.7 10*3/uL (ref 4.6–10.2)

## 2012-10-15 NOTE — Addendum Note (Signed)
Addended by: Tommas Olp on: 10/15/2012 09:57 AM   Modules accepted: Orders

## 2012-10-15 NOTE — Progress Notes (Signed)
Subjective:    Patient ID: Maurice Weaver, male    DOB: 1945/09/22, 67 y.o.   MRN: [id number]  HPI Patient returns to clinic today for followup of chronic medical problems and their management. These include hypertension, hyperlipidemia, GERD, anemia, vitamin D deficiency, and testosterone deficiency. Patient indicates that he is not taking any testosterone for a couple of months to to the expense of taking this medication. See also the review of systems. Labs are being drawn today for monitoring these medical conditions. Patient retired the end of May. He has been walking about 3 miles a day 5-6 days a week over the past one month.   Review of Systems  Constitutional: Positive for fatigue (slight). Negative for activity change and appetite change.  HENT: Positive for hearing loss. Negative for ear pain, congestion, sore throat, rhinorrhea, postnasal drip and sinus pressure.   Eyes: Negative.  Negative for photophobia, pain, discharge, redness, itching and visual disturbance.  Respiratory: Negative.  Negative for cough, choking, chest tightness, shortness of breath and wheezing.   Cardiovascular: Negative.  Negative for chest pain, palpitations and leg swelling.  Gastrointestinal: Negative.  Negative for nausea, vomiting, abdominal pain, diarrhea, constipation and anal bleeding.  Endocrine: Negative.  Negative for cold intolerance, heat intolerance, polydipsia, polyphagia and polyuria.  Genitourinary: Negative.  Negative for dysuria, urgency, frequency and hematuria.  Musculoskeletal: Positive for back pain (LBP).  Skin: Negative.  Negative for color change, pallor, rash and wound.  Allergic/Immunologic: Negative for environmental allergies.  Neurological: Negative.  Negative for dizziness, syncope, weakness, light-headedness, numbness and headaches.  Hematological: Negative.  Does not bruise/bleed easily.  Psychiatric/Behavioral: Positive for decreased concentration (slight memory  deficit). Negative for confusion, sleep disturbance and agitation. The patient is not nervous/anxious.        Objective:   Physical Exam BP 134/85  Pulse 77  Temp(Src) 97.6 F (36.4 C) (Oral)  Ht 5\' 11"  (1.803 m)  Wt 191 lb 3.2 oz (86.728 kg)  BMI 26.68 kg/m2  The patient appeared well nourished and normally developed, alert and oriented to time and place. Speech, behavior and judgement appear normal. Vital signs as documented.  Head exam is unremarkable. No scleral icterus or pallor noted. Ears nose and throat were all within normal limits. Patient is waiting to get hearing aids for both ears. The hearing aid for the right ear we'll transfer sounds to the left ear to amplify his hearing. 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  Neck is without jugular venous distension, thyromegally, or carotid bruits. Carotid upstrokes are brisk bilaterally. No cervical adenopathy. Lungs are clear anteriorly and posteriorly to auscultation. Normal respiratory effort. Cardiac exam reveals regular rate and rhythm at 72 per minute. First and second heart sounds normal.  No murmurs, rubs or gallops.  Abdominal exam reveals normal bowl sounds, no masses, no organomegaly and no aortic enlargement. No inguinal adenopathy. Rectal exam revealed no masses. The prostate was slightly enlarged but smooth and without masses. The external genitalia were normal Extremities are nonedematous and both femoral and pedal pulses are normal. Skin without pallor or jaundice.  Warm and dry, without rash. Neurologic exam reveals normal deep tendon reflexes and normal sensation.  Labs will be drawn today.        Assessment & Plan:  1. Hypertension - BMP8+EGFR  2. Hyperlipemia - Hepatic function panel; Standing - NMR, lipoprofile; Standing  3. Hearing deficit, bilateral  4. Testosterone deficiency - Testosterone,Free and Total - PSA, total and free -Before  restarting any testosterone were will recheck his  levels again in 3 more months  5. Anemia, iron deficiency - POCT CBC  6. Vitamin D deficiency disease - Vitamin D 25 hydroxy; Standing  7. GERD (gastroesophageal reflux disease)  Patient Instructions  Fall precautions discussed Continue current meds and therapeutic lifestyle changes Return to clinic in September or October for a flu shot Return FOBT    Continue to use warm wet compresses and take Aleve after breakfast and supper. It back pain does not get better return to clinic in 2-3 weeks and we will do an LS spine   Nyra Capes MD

## 2012-10-15 NOTE — Patient Instructions (Signed)
Fall precautions discussed Continue current meds and therapeutic lifestyle changes Return to clinic in September or October for a flu shot Return FOBT 

## 2012-10-17 LAB — HEPATIC FUNCTION PANEL
ALT: 34 IU/L (ref 0–44)
AST: 25 IU/L (ref 0–40)
Alkaline Phosphatase: 61 IU/L (ref 39–117)
Bilirubin, Direct: 0.26 mg/dL (ref 0.00–0.40)
Total Protein: 6.9 g/dL (ref 6.0–8.5)

## 2012-10-17 LAB — NMR, LIPOPROFILE
Cholesterol: 131 mg/dL (ref <200)
LDL Particle Number: 1324 nmol/L — ABNORMAL HIGH (ref <1000)
LDL Size: 20 nm — ABNORMAL LOW (ref >20.5)
LP-IR Score: 71 — ABNORMAL HIGH (ref <=45)

## 2012-10-17 LAB — BMP8+EGFR
BUN/Creatinine Ratio: 16 (ref 10–22)
BUN: 15 mg/dL (ref 8–27)
CO2: 25 mmol/L (ref 18–29)
Calcium: 10.6 mg/dL — ABNORMAL HIGH (ref 8.6–10.2)
Chloride: 99 mmol/L (ref 97–108)

## 2012-10-17 LAB — TESTOSTERONE,FREE AND TOTAL

## 2012-10-17 LAB — VITAMIN D 25 HYDROXY (VIT D DEFICIENCY, FRACTURES): Vit D, 25-Hydroxy: 23 ng/mL — ABNORMAL LOW (ref 30.0–100.0)

## 2012-10-17 LAB — PSA, TOTAL AND FREE: PSA, Free Pct: 31 %

## 2012-10-18 ENCOUNTER — Other Ambulatory Visit (INDEPENDENT_AMBULATORY_CARE_PROVIDER_SITE_OTHER): Payer: Medicare Other

## 2012-10-18 DIAGNOSIS — Z1212 Encounter for screening for malignant neoplasm of rectum: Secondary | ICD-10-CM

## 2012-10-23 ENCOUNTER — Encounter: Payer: Self-pay | Admitting: *Deleted

## 2012-11-12 ENCOUNTER — Telehealth: Payer: Self-pay | Admitting: *Deleted

## 2012-11-12 NOTE — Telephone Encounter (Signed)
Message copied by Bearl Mulberry on Mon Nov 12, 2012  5:43 PM ------      Message from: Ernestina Penna      Created: Wed Oct 17, 2012  1:16 PM       The liver function tests they were all within normal limits except the albumin was slightly elevated, this is okay      With advanced lipid testing, a total LDL particle number is 1324. 4 months ago it was 1217. This number should be less than 1000. The LDL C. was good at 54. Triglycerides were elevated at 193. The HDL particle number or the good cholesterol was low.------- Confirm that he is taking his Crestor regularly, watching his diet closely, and getting plenty of exercise. Recheck this again in 3-4 mo      Vitamin D is very low at 23 previously it was 29, the goal for the vitamin D would be between 50 and 60. Confirm current treatment. Call and 50,000 vitamin D take 1 weekly for 12 weeks with one refill      Blood sugar kidney function tests are good. The serum calcium is elevated. The BMP should be repeated in one week nonfasting      The total testosterone and free direct testosterone were both below------- because the patient has no complaints regarding the testosterone, we will monitor this in the future      The PSA was within normal limit----please confirm the previous PSA reading ------

## 2012-11-12 NOTE — Telephone Encounter (Signed)
Pt notified of results Wants to try OTC Vit D instead of RX He is currently taking Crestor and will do better with diet and exercise

## 2013-01-01 ENCOUNTER — Other Ambulatory Visit: Payer: Self-pay

## 2013-01-01 MED ORDER — HYDROCHLOROTHIAZIDE 25 MG PO TABS: 25.0000 mg | ORAL_TABLET | Freq: Every day | ORAL | Status: DC

## 2013-01-01 MED ORDER — METOPROLOL SUCCINATE ER 100 MG PO TB24: 100.0000 mg | ORAL_TABLET | Freq: Every day | ORAL | Status: DC

## 2013-01-01 MED ORDER — EZETIMIBE 10 MG PO TABS: 10.0000 mg | ORAL_TABLET | Freq: Every day | ORAL | Status: DC

## 2013-01-01 MED ORDER — ROSUVASTATIN CALCIUM 20 MG PO TABS: 20.0000 mg | ORAL_TABLET | Freq: Every day | ORAL | Status: DC

## 2013-01-01 MED ORDER — POTASSIUM CHLORIDE CRYS ER 10 MEQ PO TBCR: 10.0000 meq | EXTENDED_RELEASE_TABLET | Freq: Two times a day (BID) | ORAL | Status: DC

## 2013-01-01 MED ORDER — OMEPRAZOLE 40 MG PO CPDR: 40.0000 mg | DELAYED_RELEASE_CAPSULE | Freq: Every day | ORAL | Status: DC

## 2013-01-01 NOTE — Telephone Encounter (Signed)
Last seen and last lipids 10/15/12  DW[facility]  If approved for mail order route to nurse to print

## 2013-01-01 NOTE — Telephone Encounter (Signed)
All of these prescriptions are okay for her mail order for 6 months

## 2013-01-01 NOTE — Telephone Encounter (Signed)
Pt aware.

## 2013-01-23 ENCOUNTER — Encounter: Payer: Self-pay | Admitting: Family Medicine

## 2013-01-23 ENCOUNTER — Encounter: Payer: Self-pay | Admitting: *Deleted

## 2013-01-23 ENCOUNTER — Ambulatory Visit (INDEPENDENT_AMBULATORY_CARE_PROVIDER_SITE_OTHER): Payer: Medicare Other | Admitting: Family Medicine

## 2013-01-23 ENCOUNTER — Ambulatory Visit (INDEPENDENT_AMBULATORY_CARE_PROVIDER_SITE_OTHER): Payer: Medicare Other

## 2013-01-23 VITALS — BP 135/88 | HR 82 | Temp 98.8°F | Ht 71.0 in | Wt 189.0 lb

## 2013-01-23 DIAGNOSIS — I2510 Atherosclerotic heart disease of native coronary artery without angina pectoris: Secondary | ICD-10-CM

## 2013-01-23 DIAGNOSIS — K219 Gastro-esophageal reflux disease without esophagitis: Secondary | ICD-10-CM | POA: Insufficient documentation

## 2013-01-23 DIAGNOSIS — E785 Hyperlipidemia, unspecified: Secondary | ICD-10-CM

## 2013-01-23 DIAGNOSIS — R0789 Other chest pain: Secondary | ICD-10-CM

## 2013-01-23 DIAGNOSIS — E559 Vitamin D deficiency, unspecified: Secondary | ICD-10-CM

## 2013-01-23 DIAGNOSIS — Z23 Encounter for immunization: Secondary | ICD-10-CM

## 2013-01-23 DIAGNOSIS — I10 Essential (primary) hypertension: Secondary | ICD-10-CM

## 2013-01-23 DIAGNOSIS — D509 Iron deficiency anemia, unspecified: Secondary | ICD-10-CM | POA: Insufficient documentation

## 2013-01-23 DIAGNOSIS — G4700 Insomnia, unspecified: Secondary | ICD-10-CM | POA: Insufficient documentation

## 2013-01-23 LAB — POCT CBC
Granulocyte percent: 66.6 %G (ref 37–80)
HCT, POC: 50.6 % (ref 43.5–53.7)
Lymph, poc: 2.2 (ref 0.6–3.4)
MPV: 8.6 fL (ref 0–99.8)
POC Granulocyte: 4.7 (ref 2–6.9)
POC LYMPH PERCENT: 30.4 %L (ref 10–50)
Platelet Count, POC: 206 10*3/uL (ref 142–424)
RDW, POC: 12.5 %
WBC: 7.1 10*3/uL (ref 4.6–10.2)

## 2013-01-23 NOTE — Progress Notes (Signed)
Quick Note:  Copy of labs sent to patient ______ 

## 2013-01-23 NOTE — Addendum Note (Signed)
Addended by: Magdalene River on: 01/23/2013 02:59 PM   Modules accepted: Orders

## 2013-01-23 NOTE — Patient Instructions (Addendum)
Continue current medication Continue aggressive therapeutic lifestyle changes which include diet and exercise Stay current with flu shot and Prevnar Return FOBT if not already done You will be referred for an exercise stress Please call the dermatologist and schedule a visit for followup of melanoma removal  Pneumococcal Vaccine, Polyvalent suspension for injection What is this medicine? PNEUMOCOCCAL VACCINE, POLYVALENT (NEU mo KOK al vak SEEN, pol ee VEY luhnt) is a vaccine to prevent pneumococcus bacteria infection. These bacteria are a major cause of ear infections, 'Strep throat' infections, and serious pneumonia, meningitis, or blood infections worldwide. These vaccines help the body to produce antibodies (protective substances) that help your body defend against these bacteria. This vaccine is recommended for infants and young children. This vaccine will not treat an infection. This medicine may be used for other purposes; ask your health care provider or pharmacist if you have questions. COMMON BRAND NAME(S): Prevnar 13 , Prevnar What should I tell my health care provider before I take this medicine? They need to know if you have any of these conditions: -bleeding problems -fever -immune system problems -low platelet count in the blood -seizures -an unusual or allergic reaction to pneumococcal vaccine, diphtheria toxoid, other vaccines, latex, other medicines, foods, dyes, or preservatives -pregnant or trying to get pregnant -breast-feeding How should I use this medicine? This vaccine is for injection into a muscle. It is given by a health care professional. A copy of Vaccine Information Statements will be given before each vaccination. Read this sheet carefully each time. The sheet may change frequently. Talk to your pediatrician regarding the use of this medicine in children. While this drug may be prescribed for children as young as 50 weeks old for selected conditions, precautions  do apply. Overdosage: If you think you have taken too much of this medicine contact a poison control center or emergency room at once. NOTE: This medicine is only for you. Do not share this medicine with others. What if I miss a dose? It is important not to miss your dose. Call your doctor or health care professional if you are unable to keep an appointment. What may interact with this medicine? -medicines for cancer chemotherapy -medicines that suppress your immune function -medicines that treat or prevent blood clots like warfarin, enoxaparin, and dalteparin -steroid medicines like prednisone or cortisone This list may not describe all possible interactions. Give your health care provider a list of all the medicines, herbs, non-prescription drugs, or dietary supplements you use. Also tell them if you smoke, drink alcohol, or use illegal drugs. Some items may interact with your medicine. What should I watch for while using this medicine? Mild fever and pain should go away in 3 days or less. Report any unusual symptoms to your doctor or health care professional. What side effects may I notice from receiving this medicine? Side effects that you should report to your doctor or health care professional as soon as possible: -allergic reactions like skin rash, itching or hives, swelling of the face, lips, or tongue -breathing problems -confused -fever over 102 degrees F -pain, tingling, numbness in the hands or feet -seizures -unusual bleeding or bruising -unusual muscle weakness Side effects that usually do not require medical attention (report to your doctor or health care professional if they continue or are bothersome): -aches and pains -diarrhea -fever of 102 degrees F or less -headache -irritable -loss of appetite -pain, tender at site where injected -trouble sleeping This list may not describe all possible side effects. Call  your doctor for medical advice about side effects. You may  report side effects to FDA at 1-800-FDA-1088. Where should I keep my medicine? This does not apply. This vaccine is given in a clinic, pharmacy, doctor's office, or other health care setting and will not be stored at home. NOTE: This sheet is a summary. It may not cover all possible information. If you have questions about this medicine, talk to your doctor, pharmacist, or health care provider.  2014, Elsevier/Gold Standard. (2008-04-29 10:17:22)   Influenza Vaccine (Flu Vaccine, Inactivated) 2013 2014 What You Need to Know WHY GET VACCINATED?  Influenza ("flu") is a contagious disease that spreads around the [place] every winter, usually between October and May.  Flu is caused by the influenza virus, and can be spread by coughing, sneezing, and close contact.  Anyone can get flu, but the risk of getting flu is highest among children. Symptoms come on suddenly and may last several days. They can include:  Fever or chills.  Sore throat.  Muscle aches.  Fatigue.  Cough.  Headache.  Runny or stuffy nose. Flu can make some people much sicker than others. These people include young children, people 76 and older, pregnant women, and people with certain health conditions such as heart, lung or kidney disease, or a weakened immune system. Flu vaccine is especially important for these people, and anyone in close contact with them. Flu can also lead to pneumonia, and make existing medical conditions worse. It can cause diarrhea and seizures in children. Each year thousands of people in the [place] die from flu, and many more are hospitalized. Flu vaccine is the best protection we have from flu and its complications. Flu vaccine also helps prevent spreading flu from person to person. INACTIVATED FLU VACCINE There are 2 types of influenza vaccine:  You are getting an inactivated flu vaccine, which does not contain any live influenza virus. It is given by injection with a needle,  and often called the "flu shot."  A different live, attenuated (weakened) influenza vaccine is sprayed into the nostrils. This vaccine is described in a separate Vaccine Information Statement. Flu vaccine is recommended every year. Children 6 months through 55 years of age should get 2 doses the first year they get vaccinated. Flu viruses are always changing. Each year's flu vaccine is made to protect from viruses that are most likely to cause disease that year. While flu vaccine cannot prevent all cases of flu, it is our best defense against the disease. Inactivated flu vaccine protects against 3 or 4 different influenza viruses. It takes about 2 weeks for protection to develop after the vaccination, and protection lasts several months to a year. Some illnesses that are not caused by influenza virus are often mistaken for flu. Flu vaccine will not prevent these illnesses. It can only prevent influenza. A "high-dose" flu vaccine is available for people 66 years of age and older. The person giving you the vaccine can tell you more about it. Some inactivated flu vaccine contains a very small amount of a mercury-based preservative called thimerosal. Studies have shown that thimerosal in vaccines is not harmful, but flu vaccines that do not contain a preservative are available. SOME PEOPLE SHOULD NOT GET THIS VACCINE Tell the person who gives you the vaccine:  If you have any severe (life-threatening) allergies. If you ever had a life-threatening allergic reaction after a dose of flu vaccine, or have a severe allergy to any part of this vaccine, you may  be advised not to get a dose. Most, but not all, types of flu vaccine contain a small amount of egg.  If you ever had Guillain Barr Syndrome (a severe paralyzing illness, also called GBS). Some people with a history of GBS should not get this vaccine. This should be discussed with your doctor.  If you are not feeling well. They might suggest waiting until  you feel better. But you should come back. RISKS OF A VACCINE REACTION With a vaccine, like any medicine, there is a chance of side effects. These are usually mild and go away on their own. Serious side effects are also possible, but are very rare. Inactivated flu vaccine does not contain live flu virus, sogetting flu from this vaccine is not possible. Brief fainting spells and related symptoms (such as jerking movements) can happen after any medical procedure, including vaccination. Sitting or lying down for about 15 minutes after a vaccination can help prevent fainting and injuries caused by falls. Tell your doctor if you feel dizzy or lightheaded, or have vision changes or ringing in the ears. Mild problems following inactivated flu vaccine:  Soreness, redness, or swelling where the shot was given.  Hoarseness; sore, red or itchy eyes; or cough.  Fever.  Aches.  Headache.  Itching.  Fatigue. If these problems occur, they usually begin soon after the shot and last 1 or 2 days. Moderate problems following inactivated flu vaccine:  Young children who get inactivated flu vaccine and pneumococcal vaccine (PCV13) at the same time may be at increased risk for seizures caused by fever. Ask your doctor for more information. Tell your doctor if a child who is getting flu vaccine has ever had a seizure. Severe problems following inactivated flu vaccine:  A severe allergic reaction could occur after any vaccine (estimated less than 1 in a million doses).  There is a small possibility that inactivated flu vaccine could be associated with Guillan Barr Syndrome (GBS), no more than 1 or 2 cases per million people vaccinated. This is much lower than the risk of severe complications from flu, which can be prevented by flu vaccine. The safety of vaccines is always being monitored. For more information, visit: http://floyd.org/ WHAT IF THERE IS A SERIOUS REACTION? What should I look  for?  Look for anything that concerns you, such as signs of a severe allergic reaction, very high fever, or behavior changes. Signs of a severe allergic reaction can include hives, swelling of the face and throat, difficulty breathing, a fast heartbeat, dizziness, and weakness. These would start a few minutes to a few hours after the vaccination. What should I do?  If you think it is a severe allergic reaction or other emergency that cannot wait, call 9 1 1  or get the person to the nearest hospital. Otherwise, call your doctor.  Afterward, the reaction should be reported to the Vaccine Adverse Event Reporting System (VAERS). Your doctor might file this report, or you can do it yourself through the VAERS website at www.vaers.LAgents.no, or by calling 1-(270)431-0154. VAERS is only for reporting reactions. They do not give medical advice. THE NATIONAL VACCINE INJURY COMPENSATION PROGRAM The National Vaccine Injury Compensation Program (VICP) is a federal program that was created to compensate people who may have been injured by certain vaccines. Persons who believe they may have been injured by a vaccine can learn about the program and about filing a claim by calling 1-251-687-9029 or visiting the VICP website at SpiritualWord.at HOW CAN I LEARN MORE?  Ask your doctor.  Call your local or state health department.  Contact the Centers for Disease Control and Prevention (CDC):  Call (709)605-9020 (1-800-CDC-INFO) or  Visit CDC's website at BiotechRoom.com.cy CDC Inactivated Influenza Vaccine Interim VIS (09/23/11) Document Released: 12/09/2005 Document Revised: 11/09/2011 Document Reviewed: 10/18/2011 [facility] Patient Information 2014 [place], [place].

## 2013-01-23 NOTE — Progress Notes (Signed)
Subjective:    Patient ID: Maurice Weaver, male    DOB: [birth year], 67 y.o.   MRN: [id number]  HPI Patient returns to clinic for followup of chronic medical problems. He is retired. He is trying to exercise and walk more. As of late he notes some tightness in his chest after walking on occasions. His health maintenance appears up-to-date except for a flu shot and a Prevnar. He may also be due to an FOBT. As of note the patient has new hearing aids which has greatly improved his hearing capabilities.   Review of Systems  Constitutional: Positive for activity change (Patient is walking 3-4 miles about 4 days a week) and unexpected weight change (weight loss due increased activity level). Negative for fever, chills, diaphoresis, appetite change and fatigue.  HENT: Negative.   Eyes: Negative.   Respiratory: Positive for chest tightness (mild chest tightness after exercising. No chest pain.). Negative for apnea, cough, choking, shortness of breath, wheezing and stridor.   Cardiovascular: Negative.   Gastrointestinal: Negative.   Endocrine: Negative.   Genitourinary: Negative.   Musculoskeletal: Positive for back pain (chronic lower back pain). Negative for arthralgias, gait problem, joint swelling, myalgias, neck pain and neck stiffness.  Skin: Negative.   Allergic/Immunologic: Negative.   Neurological: Negative.   Hematological: Negative.   Psychiatric/Behavioral: Negative.        Objective:   Physical Exam  Nursing note and vitals reviewed. Constitutional: He is oriented to person, place, and time. He appears well-developed and well-nourished. No distress.  HENT:  Head: Normocephalic and atraumatic.  Right Ear: External ear normal.  Left Ear: External ear normal.  Nose: Nose normal.  Mouth/Throat: Oropharynx is clear and moist. No oropharyngeal exudate.  Patient has a scar right TM and a normal left TM  Eyes: Conjunctivae and EOM are normal. Pupils are equal, round, and  reactive to light. Right eye exhibits no discharge. Left eye exhibits no discharge. No scleral icterus.  Neck: Normal range of motion. Neck supple. No thyromegaly present.  Cardiovascular: Normal rate, regular rhythm, normal heart sounds and intact distal pulses.  Exam reveals no gallop and no friction rub.   No murmur heard. At 84 per minute  Pulmonary/Chest: Effort normal and breath sounds normal. No respiratory distress. He has no wheezes. He has no rales. He exhibits no tenderness.  Abdominal: Soft. Bowel sounds are normal. He exhibits no mass. There is no tenderness. There is no rebound and no guarding.  Musculoskeletal: Normal range of motion. He exhibits no edema and no tenderness.  Lymphadenopathy:    He has no cervical adenopathy.  Neurological: He is alert and oriented to person, place, and time. He has normal reflexes. No cranial nerve deficit.  Skin: Skin is warm and dry. No rash noted. No erythema. No pallor.  Psychiatric: He has a normal mood and affect. His behavior is normal. Judgment and thought content normal.    WUJ:WJXBJY EKG, normal sinus rhythm  [facility] reading (PRIMARY) by  Dr.Manaal Mandala--chest x-ray- cardiopulmonary within normal limit                                      Assessment & Plan:   1. Chest tightness   2. GERD (gastroesophageal reflux disease)   3. Insomnia   4. Anemia, iron deficiency   5. Coronary atherosclerosis of native coronary artery   6. Hyperlipidemia   7. HYPERTENSION, BENIGN  Orders Placed This Encounter  Procedures  . DG Chest 2 View    Standing Status: Future     Number of Occurrences:      Standing Expiration Date: 03/25/2014    Order Specific Question:  Reason for Exam (SYMPTOM  OR DIAGNOSIS REQUIRED)    Answer:  Chest tightness    Order Specific Question:  Preferred imaging location?    Answer:  Internal  . Ambulatory referral to Cardiology    Referral Priority:  Routine    Referral Type:  Consultation    Referral Reason:   Specialty Services Required    Referred to Provider:  Rollene Rotunda, MD    Requested Specialty:  Cardiology    Number of Visits Requested:  1  . EKG 12-Lead   Lab work will also be drawn during this visit  No orders of the defined types were placed in this encounter.   Patient Instructions  Continue current medication Continue aggressive therapeutic lifestyle changes which include diet and exercise Stay current with flu shot and Prevnar Return FOBT if not already done You will be referred for an exercise stress Please call the dermatologist and schedule a visit for followup of melanoma removal  Pneumococcal Vaccine, Polyvalent suspension for injection What is this medicine? PNEUMOCOCCAL VACCINE, POLYVALENT (NEU mo KOK al vak SEEN, pol ee VEY luhnt) is a vaccine to prevent pneumococcus bacteria infection. These bacteria are a major cause of ear infections, 'Strep throat' infections, and serious pneumonia, meningitis, or blood infections worldwide. These vaccines help the body to produce antibodies (protective substances) that help your body defend against these bacteria. This vaccine is recommended for infants and young children. This vaccine will not treat an infection. This medicine may be used for other purposes; ask your health care provider or pharmacist if you have questions. COMMON BRAND NAME(S): Prevnar 13 , Prevnar What should I tell my health care provider before I take this medicine? They need to know if you have any of these conditions: -bleeding problems -fever -immune system problems -low platelet count in the blood -seizures -an unusual or allergic reaction to pneumococcal vaccine, diphtheria toxoid, other vaccines, latex, other medicines, foods, dyes, or preservatives -pregnant or trying to get pregnant -breast-feeding How should I use this medicine? This vaccine is for injection into a muscle. It is given by a health care professional. A copy of Vaccine Information  Statements will be given before each vaccination. Read this sheet carefully each time. The sheet may change frequently. Talk to your pediatrician regarding the use of this medicine in children. While this drug may be prescribed for children as young as 41 weeks old for selected conditions, precautions do apply. Overdosage: If you think you have taken too much of this medicine contact a poison control center or emergency room at once. NOTE: This medicine is only for you. Do not share this medicine with others. What if I miss a dose? It is important not to miss your dose. Call your doctor or health care professional if you are unable to keep an appointment. What may interact with this medicine? -medicines for cancer chemotherapy -medicines that suppress your immune function -medicines that treat or prevent blood clots like warfarin, enoxaparin, and dalteparin -steroid medicines like prednisone or cortisone This list may not describe all possible interactions. Give your health care provider a list of all the medicines, herbs, non-prescription drugs, or dietary supplements you use. Also tell them if you smoke, drink alcohol, or use illegal drugs. Some items  may interact with your medicine. What should I watch for while using this medicine? Mild fever and pain should go away in 3 days or less. Report any unusual symptoms to your doctor or health care professional. What side effects may I notice from receiving this medicine? Side effects that you should report to your doctor or health care professional as soon as possible: -allergic reactions like skin rash, itching or hives, swelling of the face, lips, or tongue -breathing problems -confused -fever over 102 degrees F -pain, tingling, numbness in the hands or feet -seizures -unusual bleeding or bruising -unusual muscle weakness Side effects that usually do not require medical attention (report to your doctor or health care professional if they  continue or are bothersome): -aches and pains -diarrhea -fever of 102 degrees F or less -headache -irritable -loss of appetite -pain, tender at site where injected -trouble sleeping This list may not describe all possible side effects. Call your doctor for medical advice about side effects. You may report side effects to FDA at 1-800-FDA-1088. Where should I keep my medicine? This does not apply. This vaccine is given in a clinic, pharmacy, doctor's office, or other health care setting and will not be stored at home. NOTE: This sheet is a summary. It may not cover all possible information. If you have questions about this medicine, talk to your doctor, pharmacist, or health care provider.  2014, Elsevier/Gold Standard. (2008-04-29 10:17:22)   Influenza Vaccine (Flu Vaccine, Inactivated) 2013 2014 What You Need to Know WHY GET VACCINATED?  Influenza ("flu") is a contagious disease that spreads around the [place] every winter, usually between October and May.  Flu is caused by the influenza virus, and can be spread by coughing, sneezing, and close contact.  Anyone can get flu, but the risk of getting flu is highest among children. Symptoms come on suddenly and may last several days. They can include:  Fever or chills.  Sore throat.  Muscle aches.  Fatigue.  Cough.  Headache.  Runny or stuffy nose. Flu can make some people much sicker than others. These people include young children, people 55 and older, pregnant women, and people with certain health conditions such as heart, lung or kidney disease, or a weakened immune system. Flu vaccine is especially important for these people, and anyone in close contact with them. Flu can also lead to pneumonia, and make existing medical conditions worse. It can cause diarrhea and seizures in children. Each year thousands of people in the [place] die from flu, and many more are hospitalized. Flu vaccine is the best protection  we have from flu and its complications. Flu vaccine also helps prevent spreading flu from person to person. INACTIVATED FLU VACCINE There are 2 types of influenza vaccine:  You are getting an inactivated flu vaccine, which does not contain any live influenza virus. It is given by injection with a needle, and often called the "flu shot."  A different live, attenuated (weakened) influenza vaccine is sprayed into the nostrils. This vaccine is described in a separate Vaccine Information Statement. Flu vaccine is recommended every year. Children 6 months through 82 years of age should get 2 doses the first year they get vaccinated. Flu viruses are always changing. Each year's flu vaccine is made to protect from viruses that are most likely to cause disease that year. While flu vaccine cannot prevent all cases of flu, it is our best defense against the disease. Inactivated flu vaccine protects against 3 or 4 different  influenza viruses. It takes about 2 weeks for protection to develop after the vaccination, and protection lasts several months to a year. Some illnesses that are not caused by influenza virus are often mistaken for flu. Flu vaccine will not prevent these illnesses. It can only prevent influenza. A "high-dose" flu vaccine is available for people 35 years of age and older. The person giving you the vaccine can tell you more about it. Some inactivated flu vaccine contains a very small amount of a mercury-based preservative called thimerosal. Studies have shown that thimerosal in vaccines is not harmful, but flu vaccines that do not contain a preservative are available. SOME PEOPLE SHOULD NOT GET THIS VACCINE Tell the person who gives you the vaccine:  If you have any severe (life-threatening) allergies. If you ever had a life-threatening allergic reaction after a dose of flu vaccine, or have a severe allergy to any part of this vaccine, you may be advised not to get a dose. Most, but not all,  types of flu vaccine contain a small amount of egg.  If you ever had Guillain Barr Syndrome (a severe paralyzing illness, also called GBS). Some people with a history of GBS should not get this vaccine. This should be discussed with your doctor.  If you are not feeling well. They might suggest waiting until you feel better. But you should come back. RISKS OF A VACCINE REACTION With a vaccine, like any medicine, there is a chance of side effects. These are usually mild and go away on their own. Serious side effects are also possible, but are very rare. Inactivated flu vaccine does not contain live flu virus, sogetting flu from this vaccine is not possible. Brief fainting spells and related symptoms (such as jerking movements) can happen after any medical procedure, including vaccination. Sitting or lying down for about 15 minutes after a vaccination can help prevent fainting and injuries caused by falls. Tell your doctor if you feel dizzy or lightheaded, or have vision changes or ringing in the ears. Mild problems following inactivated flu vaccine:  Soreness, redness, or swelling where the shot was given.  Hoarseness; sore, red or itchy eyes; or cough.  Fever.  Aches.  Headache.  Itching.  Fatigue. If these problems occur, they usually begin soon after the shot and last 1 or 2 days. Moderate problems following inactivated flu vaccine:  Young children who get inactivated flu vaccine and pneumococcal vaccine (PCV13) at the same time may be at increased risk for seizures caused by fever. Ask your doctor for more information. Tell your doctor if a child who is getting flu vaccine has ever had a seizure. Severe problems following inactivated flu vaccine:  A severe allergic reaction could occur after any vaccine (estimated less than 1 in a million doses).  There is a small possibility that inactivated flu vaccine could be associated with Guillan Barr Syndrome (GBS), no more than 1 or 2  cases per million people vaccinated. This is much lower than the risk of severe complications from flu, which can be prevented by flu vaccine. The safety of vaccines is always being monitored. For more information, visit: http://floyd.org/ WHAT IF THERE IS A SERIOUS REACTION? What should I look for?  Look for anything that concerns you, such as signs of a severe allergic reaction, very high fever, or behavior changes. Signs of a severe allergic reaction can include hives, swelling of the face and throat, difficulty breathing, a fast heartbeat, dizziness, and weakness. These would start a few  minutes to a few hours after the vaccination. What should I do?  If you think it is a severe allergic reaction or other emergency that cannot wait, call [phone] 1 1  or get the person to the nearest hospital. Otherwise, call your doctor.  Afterward, the reaction should be reported to the Vaccine Adverse Event Reporting System (VAERS). Your doctor might file this report, or you can do it yourself through the VAERS website at www.vaers.LAgents.no, or by calling 1-762-543-3599. VAERS is only for reporting reactions. They do not give medical advice. THE NATIONAL VACCINE INJURY COMPENSATION PROGRAM The National Vaccine Injury Compensation Program (VICP) is a federal program that was created to compensate people who may have been injured by certain vaccines. Persons who believe they may have been injured by a vaccine can learn about the program and about filing a claim by calling 1-(856) 353-6328 or visiting the VICP website at SpiritualWord.at HOW CAN I LEARN MORE?  Ask your doctor.  Call your local or state health department.  Contact the Centers for Disease Control and Prevention (CDC):  Call 647-272-6268 (1-800-CDC-INFO) or  Visit CDC's website at BiotechRoom.com.cy CDC Inactivated Influenza Vaccine Interim VIS (09/23/11) Document Released: 12/09/2005 Document Revised: 11/09/2011 Document  Reviewed: 10/18/2011 [facility] Patient Information 2014 [place], [place].     Nyra Capes MD

## 2013-01-26 LAB — BMP8+EGFR
BUN: 14 mg/dL (ref 8–27)
CO2: 23 mmol/L (ref 18–29)
Calcium: 10.3 mg/dL — ABNORMAL HIGH (ref 8.6–10.2)
Creatinine, Ser: 1.06 mg/dL (ref 0.76–1.27)
Glucose: 102 mg/dL — ABNORMAL HIGH (ref 65–99)
Potassium: 4.4 mmol/L (ref 3.5–5.2)
Sodium: 140 mmol/L (ref 134–144)

## 2013-01-26 LAB — NMR, LIPOPROFILE
HDL Cholesterol by NMR: 37 mg/dL — ABNORMAL LOW (ref >=40)
Small LDL Particle Number: 562 nmol/L — ABNORMAL HIGH (ref <=527)
Triglycerides by NMR: 153 mg/dL — ABNORMAL HIGH (ref <150)

## 2013-01-26 LAB — VITAMIN D 25 HYDROXY (VIT D DEFICIENCY, FRACTURES): Vit D, 25-Hydroxy: 31.2 ng/mL (ref 30.0–100.0)

## 2013-02-27 ENCOUNTER — Encounter: Payer: Self-pay | Admitting: *Deleted

## 2013-02-28 HISTORY — PX: INNER EAR SURGERY: SHX679

## 2013-03-06 ENCOUNTER — Ambulatory Visit (INDEPENDENT_AMBULATORY_CARE_PROVIDER_SITE_OTHER): Payer: Medicare PPO | Admitting: Cardiology

## 2013-03-06 ENCOUNTER — Encounter: Payer: Self-pay | Admitting: Cardiology

## 2013-03-06 VITALS — BP 128/86 | HR 73 | Ht 71.0 in | Wt 190.0 lb

## 2013-03-06 DIAGNOSIS — I2510 Atherosclerotic heart disease of native coronary artery without angina pectoris: Secondary | ICD-10-CM

## 2013-03-06 DIAGNOSIS — I10 Essential (primary) hypertension: Secondary | ICD-10-CM

## 2013-03-06 NOTE — Progress Notes (Signed)
HPI The patient has a distant history of CAD with a PCI in the late 90s by Dr. Olevia Perches.  He has not had further problems.  I was able to see that the last nuclear stress test was in 2010.  This was normal.  He is very active walking 3 1/2 miles daily.  With this he does not get the chest pain that he had at the time of his diagnosis.  The patient denies any new symptoms such as chest discomfort, neck or arm discomfort. There has been no new shortness of breath, PND or orthopnea. There have been no reported palpitations, presyncope or syncope.  He does at times feel a brief episode of "flutter" after he exercises.  However, this is not associated with other symptoms.    Allergies  Allergen Reactions  . Ampicillin Nausea Only  . Erythromycin     Current Outpatient Prescriptions  Medication Sig Dispense Refill  . aspirin (LONGS ADULT LOW STRENGTH ASA) 81 MG EC tablet Take 81 mg by mouth daily.        . cholecalciferol (VITAMIN D) 1000 UNITS tablet Take 1,000 Units by mouth daily.        Marland Kitchen ezetimibe (ZETIA) 10 MG tablet Take 1 tablet (10 mg total) by mouth at bedtime.  90 tablet  0  . ferrous sulfate 325 (65 FE) MG tablet Take 1 tablet (325 mg total) by mouth daily with breakfast.  30 tablet  11  . hydrochlorothiazide (HYDRODIURIL) 25 MG tablet Take 1 tablet (25 mg total) by mouth daily.  90 tablet  0  . metoprolol succinate (TOPROL XL) 100 MG 24 hr tablet Take 1 tablet (100 mg total) by mouth daily.  90 tablet  0  . omeprazole (PRILOSEC) 40 MG capsule Take 1 capsule (40 mg total) by mouth daily.  90 capsule  0  . potassium chloride (K-DUR,KLOR-CON) 10 MEQ tablet Take 1 tablet (10 mEq total) by mouth 2 (two) times daily.  90 tablet  0  . ramipril (ALTACE) 10 MG capsule Take 10 mg by mouth daily.        . rosuvastatin (CRESTOR) 20 MG tablet Take 1 tablet (20 mg total) by mouth at bedtime.  90 tablet  0  . zolpidem (AMBIEN) 10 MG tablet Take 10 mg by mouth at bedtime as needed.         No  current facility-administered medications for this visit.    Past Medical History  Diagnosis Date  . Hiatal hernia   . Diverticulosis   . Hyperlipidemia   . AF (atrial fibrillation)   . Asthma     stable  . Decreased hearing   . Nephrolithiasis   . CAD (coronary artery disease)   . ED (erectile dysfunction)   . Elevated BP   . History of ETT 12/1993  . Decreased radial pulse   . Cholesteatoma   . S/P angioplasty   . GERD (gastroesophageal reflux disease)   . Fundic gland polyps of stomach, benign   . Schatzki's ring   . MRSA (methicillin resistant staph aureus) culture positive 07/2009  . Diverticulitis of colon     recurrent w/abscess - resected 2011  . Melanoma   . Hypertension     Past Surgical History  Procedure Laterality Date  . Colonoscopy  2005  . Esophagogastroduodenoscopy  2005  . Tonsillectomy and adenoidectomy    . Coronary angioplasty with stent placement  2000  . Inner ear surgery  x5 cholesteatomas  . Colon surgery  2011    diverticulitis left resection-Toth  . Melanoma excision  2012    ROS:  Positive for decreased hearing, reflux.  Otherwise as stated in the HPI and negative for all other systems.  PHYSICAL EXAM BP 128/86  Pulse 73  Ht 5\' 11"  (1.803 m)  Wt 190 lb (86.183 kg)  BMI 26.51 kg/m2 GENERAL:  Well appearing HEENT:  Pupils equal round and reactive, fundi not visualized, oral mucosa unremarkable NECK:  No jugular venous distention, waveform within normal limits, carotid upstroke brisk and symmetric, no bruits, no thyromegaly LYMPHATICS:  No cervical, inguinal adenopathy LUNGS:  Clear to auscultation bilaterally BACK:  No CVA tenderness CHEST:  Unremarkable HEART:  PMI not displaced or sustained,S1 and S2 within normal limits, no S3, no S4, no clicks, no rubs, no murmurs ABD:  Flat, positive bowel sounds normal in frequency in pitch, no bruits, no rebound, no guarding, no midline pulsatile mass, no hepatomegaly, no  splenomegaly EXT:  2 plus pulses throughout, no edema, no cyanosis no clubbing SKIN:  No rashes no nodules NEURO:  Cranial nerves II through XII grossly intact, motor grossly intact throughout PSYCH:  Cognitively intact, oriented to person place and time   EKG:  Sinus rhythm, rate 75, axis within normal limits, intervals within normal limits, no acute ST-T wave changes.  RSR' V1.  03/06/2013  ASSESSMENT AND PLAN  CAD:  I will bring the patient back for a POET (Plain Old Exercise Test). This will allow me to screen for obstructive coronary disease, risk stratify and very importantly provide a prescription for exercise.  FLUTTER:  He might have some arrhythmia.  However, this is mild and I don't think at this point that further monitoring is indicated.  He will let me know if this worsens.  HTN:  The blood pressure is at target. No change in medications is indicated. We will continue with therapeutic lifestyle changes (TLC).

## 2013-03-06 NOTE — Patient Instructions (Signed)
The current medical regimen is effective;  continue present plan and medications.  Your physician has requested that you have an exercise tolerance test. For further information please visit [email or web address]. Please also follow instruction sheet, as given.

## 2013-03-11 ENCOUNTER — Telehealth: Payer: Self-pay | Admitting: Family Medicine

## 2013-03-18 ENCOUNTER — Telehealth: Payer: Self-pay | Admitting: Family Medicine

## 2013-03-18 ENCOUNTER — Other Ambulatory Visit: Payer: Self-pay | Admitting: *Deleted

## 2013-03-18 MED ORDER — HYDROCHLOROTHIAZIDE 25 MG PO TABS: 25.0000 mg | ORAL_TABLET | Freq: Every day | ORAL | Status: DC

## 2013-03-18 MED ORDER — EZETIMIBE 10 MG PO TABS: 10.0000 mg | ORAL_TABLET | Freq: Every day | ORAL | Status: DC

## 2013-03-18 MED ORDER — RAMIPRIL 10 MG PO CAPS
10.0000 mg | ORAL_CAPSULE | Freq: Every day | ORAL | Status: DC
Start: 2013-03-18 — End: 2013-05-24

## 2013-03-18 MED ORDER — POTASSIUM CHLORIDE CRYS ER 10 MEQ PO TBCR: 10.0000 meq | EXTENDED_RELEASE_TABLET | Freq: Two times a day (BID) | ORAL | Status: DC

## 2013-03-18 MED ORDER — ZOLPIDEM TARTRATE 10 MG PO TABS: 10.0000 mg | ORAL_TABLET | Freq: Every evening | ORAL | Status: DC | PRN

## 2013-03-18 MED ORDER — ROSUVASTATIN CALCIUM 20 MG PO TABS: 20.0000 mg | ORAL_TABLET | Freq: Every day | ORAL | Status: DC

## 2013-03-18 MED ORDER — METOPROLOL SUCCINATE ER 100 MG PO TB24: 100.0000 mg | ORAL_TABLET | Freq: Every day | ORAL | Status: DC

## 2013-03-18 MED ORDER — OMEPRAZOLE 40 MG PO CPDR: 40.0000 mg | DELAYED_RELEASE_CAPSULE | Freq: Every day | ORAL | Status: DC

## 2013-03-18 NOTE — Telephone Encounter (Signed)
Patient wants refills will send back to [facility] in approve

## 2013-03-18 NOTE — Telephone Encounter (Signed)
Would like all these meds printed so he can send in to mail order. Has changed companies and needs the rxs. Please advise and if approved please print and route to [facility] so nurse can call patient to pick up

## 2013-03-28 ENCOUNTER — Telehealth: Payer: Self-pay | Admitting: Family Medicine

## 2013-04-01 NOTE — Telephone Encounter (Signed)
Clarified 04/01/13

## 2013-04-08 ENCOUNTER — Encounter (INDEPENDENT_AMBULATORY_CARE_PROVIDER_SITE_OTHER): Payer: Self-pay

## 2013-04-08 ENCOUNTER — Ambulatory Visit (INDEPENDENT_AMBULATORY_CARE_PROVIDER_SITE_OTHER): Payer: Medicare PPO | Admitting: Physician Assistant

## 2013-04-08 DIAGNOSIS — I10 Essential (primary) hypertension: Secondary | ICD-10-CM

## 2013-04-08 DIAGNOSIS — I2510 Atherosclerotic heart disease of native coronary artery without angina pectoris: Secondary | ICD-10-CM

## 2013-04-08 NOTE — Progress Notes (Signed)
Exercise Treadmill Test  Pre-Exercise Testing Evaluation Rhythm: normal sinus  Rate: 78     Test  Exercise Tolerance Test Ordering MD: Marijo File, MD  Interpreting MD: Richardson Dopp, PA-C  Unique Test No: 1  Treadmill:  1  Indication for ETT: known ASHD  Contraindication to ETT: No   Stress Modality: exercise - treadmill  Cardiac Imaging Performed: non   Protocol: standard Bruce - maximal  Max BP:  203/101  Max MPHR (bpm):  152 85% MPR (bpm):  129  MPHR obtained (bpm):  150 % MPHR obtained:  99  Reached 85% MPHR (min:sec):  4:03 Total Exercise Time (min-sec):  7:00  Workload in METS:  8.5 Borg Scale: 15  Reason ETT Terminated:  desired heart rate attained    ST Segment Analysis At Rest: normal ST segments - no evidence of significant ST depression With Exercise: no evidence of significant ST depression  Other Information Arrhythmia:  No Angina during ETT:  absent (0) Quality of ETT:  diagnostic  ETT Interpretation:  normal - no evidence of ischemia by ST analysis  Comments: Good exercise capacity. No chest pain. Normal BP response to exercise. No ST changes to suggest ischemia.  Good HR recovery in 1st minute post exercise at 2 mph on 2% grade.    Recommendations: F/u with Dr. Minus Breeding as directed. Signed,  Richardson Dopp, PA-C   04/08/2013 9:57 AM

## 2013-05-06 ENCOUNTER — Ambulatory Visit (INDEPENDENT_AMBULATORY_CARE_PROVIDER_SITE_OTHER): Payer: Commercial Managed Care - HMO

## 2013-05-06 ENCOUNTER — Encounter: Payer: Self-pay | Admitting: Family Medicine

## 2013-05-06 ENCOUNTER — Ambulatory Visit (INDEPENDENT_AMBULATORY_CARE_PROVIDER_SITE_OTHER): Payer: Commercial Managed Care - HMO | Admitting: Family Medicine

## 2013-05-06 VITALS — BP 130/84 | HR 75 | Temp 97.6°F | Ht 71.0 in | Wt 189.0 lb

## 2013-05-06 DIAGNOSIS — K219 Gastro-esophageal reflux disease without esophagitis: Secondary | ICD-10-CM

## 2013-05-06 DIAGNOSIS — M79672 Pain in left foot: Secondary | ICD-10-CM

## 2013-05-06 DIAGNOSIS — M79609 Pain in unspecified limb: Secondary | ICD-10-CM

## 2013-05-06 DIAGNOSIS — D509 Iron deficiency anemia, unspecified: Secondary | ICD-10-CM

## 2013-05-06 DIAGNOSIS — E785 Hyperlipidemia, unspecified: Secondary | ICD-10-CM

## 2013-05-06 DIAGNOSIS — E559 Vitamin D deficiency, unspecified: Secondary | ICD-10-CM

## 2013-05-06 DIAGNOSIS — I10 Essential (primary) hypertension: Secondary | ICD-10-CM

## 2013-05-06 DIAGNOSIS — R7309 Other abnormal glucose: Secondary | ICD-10-CM

## 2013-05-06 LAB — POCT CBC
GRANULOCYTE PERCENT: 61.3 % (ref 37–80)
HCT, POC: 55.4 % — AB (ref 43.5–53.7)
HEMOGLOBIN: 17.3 g/dL (ref 14.1–18.1)
Lymph, poc: 2.6 (ref 0.6–3.4)
MCH: 29.1 pg (ref 27–31.2)
MCHC: 31.3 g/dL — AB (ref 31.8–35.4)
MCV: 92.9 fL (ref 80–97)
MPV: 8.3 fL (ref 0–99.8)
PLATELET COUNT, POC: 232 10*3/uL (ref 142–424)
POC Granulocyte: 4.6 (ref 2–6.9)
POC LYMPH %: 35.3 % (ref 10–50)
RBC: 6 M/uL (ref 4.69–6.13)
RDW, POC: 12.6 %
WBC: 7.5 10*3/uL (ref 4.6–10.2)

## 2013-05-06 NOTE — Patient Instructions (Addendum)
Medicare Annual Wellness Visit  [facility] and the medical providers at [facility] strive to bring you the best medical care.  In doing so we not only want to address your current medical conditions and concerns but also to detect new conditions early and prevent illness, disease and health-related problems.    Medicare offers a yearly Wellness Visit which allows our clinical staff to assess your need for preventative services including immunizations, lifestyle education, counseling to decrease risk of preventable diseases and screening for fall risk and other medical concerns.    This visit is provided free of charge (no copay) for all Medicare recipients. The clinical pharmacists at [facility] have begun to conduct these Wellness Visits which will also include a thorough review of all your medications.    As you primary medical provider recommend that you make an appointment for your Annual Wellness Visit if you have not done so already this year.  You may set up this appointment before you leave today or you may call back ([phone]) and schedule an appointment.  Please make sure when you call that you mention that you are scheduling your Annual Wellness Visit with the clinical pharmacist so that the appointment may be made for the proper length of time.     Continue current medications. Continue good therapeutic lifestyle changes which include good diet and exercise. Fall precautions discussed with patient. If an FOBT was given today- please return it to our front desk. If you are over 50 years old - you may need Prevnar 47 or the adult Pneumonia vaccine.  We will call you with the results of the lab work and the x-ray of your foot once those results are available Warm Soaks to the left foot may be helpful and just some simple stretching exercises while you are in seated Also you may want to check at the shoe market for any  special shoes they may have that may help plantar foot pain

## 2013-05-06 NOTE — Progress Notes (Signed)
Subjective:    Patient ID: Maurice Weaver, male    DOB: 1945-05-05, 68 y.o.   MRN: [id number]  HPI Pt here for follow up and management of chronic medical problems. Patient complains of pain in his left foot. This pain has kept him from walking as much as he would like to walk.        Patient Active Problem List   Diagnosis Date Noted  . GERD (gastroesophageal reflux disease) 01/23/2013  . Insomnia 01/23/2013  . Anemia, iron deficiency 01/23/2013  . Melanoma in situ of cheek, history of 06/13/2012  . Hearing deficit 06/13/2012  . Hyperlipidemia 12/24/2008  . HYPERTENSION, BENIGN 12/24/2008  . Coronary atherosclerosis of native coronary artery 12/24/2008   Outpatient Encounter Prescriptions as of 05/06/2013  Medication Sig  . aspirin (LONGS ADULT LOW STRENGTH ASA) 81 MG EC tablet Take 81 mg by mouth daily.    . cholecalciferol (VITAMIN D) 1000 UNITS tablet Take 1,000 Units by mouth daily.    Marland Kitchen ezetimibe (ZETIA) 10 MG tablet Take 1 tablet (10 mg total) by mouth at bedtime.  . ferrous sulfate 325 (65 FE) MG tablet Take 1 tablet (325 mg total) by mouth daily with breakfast.  . hydrochlorothiazide (HYDRODIURIL) 25 MG tablet Take 1 tablet (25 mg total) by mouth daily.  . metoprolol succinate (TOPROL XL) 100 MG 24 hr tablet Take 1 tablet (100 mg total) by mouth daily.  Marland Kitchen omeprazole (PRILOSEC) 40 MG capsule Take 1 capsule (40 mg total) by mouth daily.  . potassium chloride (K-DUR,KLOR-CON) 10 MEQ tablet Take 1 tablet (10 mEq total) by mouth 2 (two) times daily.  . ramipril (ALTACE) 10 MG capsule Take 1 capsule (10 mg total) by mouth daily.  . rosuvastatin (CRESTOR) 20 MG tablet Take 1 tablet (20 mg total) by mouth at bedtime.  Marland Kitchen zolpidem (AMBIEN) 10 MG tablet Take 1 tablet (10 mg total) by mouth at bedtime as needed.    Review of Systems  Constitutional: Negative.   HENT: Negative.   Eyes: Negative.   Respiratory: Negative.   Cardiovascular: Negative.   Gastrointestinal:  Negative.   Endocrine: Negative.   Genitourinary: Negative.   Musculoskeletal: Negative.        Painful feet  Skin: Negative.   Allergic/Immunologic: Negative.   Neurological: Negative.   Hematological: Negative.   Psychiatric/Behavioral: Negative.        Objective:   Physical Exam  Nursing note and vitals reviewed. Constitutional: He is oriented to person, place, and time. He appears well-developed and well-nourished. No distress.  Pleasant and cooperative, hearing is much improved  HENT:  Head: Normocephalic and atraumatic.  Right Ear: External ear normal.  Left Ear: External ear normal.  Mouth/Throat: Oropharynx is clear and moist. No oropharyngeal exudate.  Bilateral hearing aids with left being the dominant hearing aid which connects to the right hearing aid, some nasal congestion bilaterally  Eyes: Conjunctivae and EOM are normal. Pupils are equal, round, and reactive to light. Right eye exhibits no discharge. Left eye exhibits no discharge. No scleral icterus.  Neck: Normal range of motion. Neck supple. No thyromegaly present.  No carotid bruits  Cardiovascular: Normal rate, regular rhythm, normal heart sounds and intact distal pulses.  Exam reveals no gallop and no friction rub.   No murmur heard. At 72 per minute  Pulmonary/Chest: Effort normal and breath sounds normal. No respiratory distress. He has no wheezes. He has no rales.  No axillary adenopathy  Abdominal: Soft. Bowel sounds are normal. He  exhibits no mass. There is no tenderness. There is no rebound and no guarding.  Musculoskeletal: Normal range of motion. He exhibits no edema and no tenderness.  Lymphadenopathy:    He has no cervical adenopathy.  Neurological: He is alert and oriented to person, place, and time. He has normal reflexes. No cranial nerve deficit.  Skin: Skin is warm and dry. No rash noted.  Psychiatric: He has a normal mood and affect. His behavior is normal. Judgment and thought content  normal.   BP 130/84  Pulse 75  Temp(Src) 97.6 F (36.4 C) (Oral)  Ht '5\' 11"'  (1.803 m)  Wt 189 lb (85.73 kg)  BMI 26.37 kg/m2  [facility] reading (PRIMARY) by  Dr. Louretta Parma foot and heel  --left heel spur                                      Assessment & Plan:  1. GERD (gastroesophageal reflux disease) - POCT CBC  2. Hyperlipidemia - NMR, lipoprofile  3. HYPERTENSION, BENIGN - BMP8+EGFR - Hepatic function panel  4. Anemia, iron deficiency - POCT CBC  5. Vitamin D deficiency - Vit D  25 hydroxy (rtn osteoporosis monitoring)  6. Pain of left heel - DG Foot Complete Left; Future -Referral to orthopedist  Patient Instructions                       Medicare Annual Wellness Visit  [facility] and the medical providers at [facility] strive to bring you the best medical care.  In doing so we not only want to address your current medical conditions and concerns but also to detect new conditions early and prevent illness, disease and health-related problems.    Medicare offers a yearly Wellness Visit which allows our clinical staff to assess your need for preventative services including immunizations, lifestyle education, counseling to decrease risk of preventable diseases and screening for fall risk and other medical concerns.    This visit is provided free of charge (no copay) for all Medicare recipients. The clinical pharmacists at [facility] have begun to conduct these Wellness Visits which will also include a thorough review of all your medications.    As you primary medical provider recommend that you make an appointment for your Annual Wellness Visit if you have not done so already this year.  You may set up this appointment before you leave today or you may call back ([phone]) and schedule an appointment.  Please make sure when you call that you mention that you are scheduling your Annual Wellness Visit with the clinical  pharmacist so that the appointment may be made for the proper length of time.     Continue current medications. Continue good therapeutic lifestyle changes which include good diet and exercise. Fall precautions discussed with patient. If an FOBT was given today- please return it to our front desk. If you are over 69 years old - you may need Prevnar 43 or the adult Pneumonia vaccine.  We will call you with the results of the lab work and the x-ray of your foot once those results are available Warm Soaks to the left foot may be helpful and just some simple stretching exercises while you are in seated Also you may want to check at the shoe market for any special shoes they may have that may help plantar foot pain  Arrie Senate MD

## 2013-05-08 LAB — BMP8+EGFR
BUN/Creatinine Ratio: 10 (ref 10–22)
BUN: 12 mg/dL (ref 8–27)
CALCIUM: 10.5 mg/dL — AB (ref 8.6–10.2)
CO2: 26 mmol/L (ref 18–29)
CREATININE: 1.19 mg/dL (ref 0.76–1.27)
Chloride: 97 mmol/L (ref 97–108)
GFR calc Af Amer: 72 mL/min/{1.73_m2} (ref >59)
GFR, EST NON AFRICAN AMERICAN: 62 mL/min/{1.73_m2} (ref >59)
Glucose: 110 mg/dL — ABNORMAL HIGH (ref 65–99)
Potassium: 4.2 mmol/L (ref 3.5–5.2)
SODIUM: 141 mmol/L (ref 134–144)

## 2013-05-08 LAB — HEPATIC FUNCTION PANEL
ALBUMIN: 4.8 g/dL (ref 3.6–4.8)
ALT: 35 IU/L (ref 0–44)
AST: 26 IU/L (ref 0–40)
Alkaline Phosphatase: 55 IU/L (ref 39–117)
BILIRUBIN DIRECT: 0.28 mg/dL (ref 0.00–0.40)
BILIRUBIN TOTAL: 1.4 mg/dL — AB (ref 0.0–1.2)
Total Protein: 6.9 g/dL (ref 6.0–8.5)

## 2013-05-08 LAB — NMR, LIPOPROFILE
Cholesterol: 129 mg/dL (ref <200)
HDL Cholesterol by NMR: 44 mg/dL (ref >=40)
HDL Particle Number: 31 umol/L (ref >=30.5)
LDL Particle Number: 1100 nmol/L — ABNORMAL HIGH (ref <1000)
LDL Size: 20.5 nm — ABNORMAL LOW (ref >20.5)
LDLC SERPL CALC-MCNC: 58 mg/dL (ref <100)
LP-IR Score: 65 — ABNORMAL HIGH (ref <=45)
SMALL LDL PARTICLE NUMBER: 585 nmol/L — AB (ref <=527)
TRIGLYCERIDES BY NMR: 136 mg/dL (ref <150)

## 2013-05-08 LAB — POCT GLYCOSYLATED HEMOGLOBIN (HGB A1C)

## 2013-05-08 LAB — VITAMIN D 25 HYDROXY (VIT D DEFICIENCY, FRACTURES): Vit D, 25-Hydroxy: 25.1 ng/mL — ABNORMAL LOW (ref 30.0–100.0)

## 2013-05-08 NOTE — Addendum Note (Signed)
Addended by: Pollyann Kennedy F on: 05/08/2013 02:15 PM   Modules accepted: Orders

## 2013-05-24 ENCOUNTER — Other Ambulatory Visit: Payer: Self-pay | Admitting: Family Medicine

## 2013-08-08 ENCOUNTER — Other Ambulatory Visit: Payer: Self-pay

## 2013-08-08 MED ORDER — ZOLPIDEM TARTRATE 10 MG PO TABS
10.0000 mg | ORAL_TABLET | Freq: Every evening | ORAL | Status: DC | PRN
Start: 2013-08-08 — End: 2014-10-23

## 2013-08-08 NOTE — Telephone Encounter (Signed)
Patient aware to come

## 2013-08-08 NOTE — Telephone Encounter (Signed)
Last seen 05/06/13 DWM  If approved print for mail order and route to nurse

## 2013-08-08 NOTE — Telephone Encounter (Signed)
This is okay to refill 

## 2013-08-14 ENCOUNTER — Other Ambulatory Visit: Payer: Self-pay | Admitting: Family Medicine

## 2013-08-18 ENCOUNTER — Other Ambulatory Visit: Payer: Self-pay | Admitting: *Deleted

## 2013-08-18 MED ORDER — METOPROLOL SUCCINATE ER 100 MG PO TB24: 100.0000 mg | ORAL_TABLET | Freq: Every day | ORAL | Status: DC

## 2013-08-18 MED ORDER — POTASSIUM CHLORIDE CRYS ER 10 MEQ PO TBCR: 10.0000 meq | EXTENDED_RELEASE_TABLET | Freq: Two times a day (BID) | ORAL | Status: DC

## 2013-09-03 ENCOUNTER — Other Ambulatory Visit: Payer: Self-pay | Admitting: Otolaryngology

## 2013-09-03 DIAGNOSIS — H908 Mixed conductive and sensorineural hearing loss, unspecified: Secondary | ICD-10-CM

## 2013-09-03 DIAGNOSIS — H7120 Cholesteatoma of mastoid, unspecified ear: Secondary | ICD-10-CM

## 2013-09-03 DIAGNOSIS — H7200 Central perforation of tympanic membrane, unspecified ear: Secondary | ICD-10-CM

## 2013-09-03 LAB — BUN: BUN: 15 mg/dL (ref 6–23)

## 2013-09-03 LAB — CREATININE, SERUM: CREATININE: 0.93 mg/dL (ref 0.50–1.35)

## 2013-09-06 ENCOUNTER — Ambulatory Visit
Admission: RE | Admit: 2013-09-06 | Discharge: 2013-09-06 | Disposition: A | Discharge status: Home / Self Care | Payer: Medicare PPO | Source: Ambulatory Visit | Attending: Otolaryngology | Admitting: Otolaryngology

## 2013-09-06 DIAGNOSIS — H908 Mixed conductive and sensorineural hearing loss, unspecified: Secondary | ICD-10-CM

## 2013-09-06 DIAGNOSIS — H7200 Central perforation of tympanic membrane, unspecified ear: Secondary | ICD-10-CM

## 2013-09-06 DIAGNOSIS — H7120 Cholesteatoma of mastoid, unspecified ear: Secondary | ICD-10-CM

## 2013-09-06 MED ORDER — IOHEXOL 300 MG/ML  SOLN
75.0000 mL | Freq: Once | INTRAMUSCULAR | Status: AC | PRN
  Administered 2013-09-06: 75 mL via INTRAVENOUS

## 2013-09-10 ENCOUNTER — Ambulatory Visit (INDEPENDENT_AMBULATORY_CARE_PROVIDER_SITE_OTHER): Payer: Commercial Managed Care - HMO | Admitting: Family Medicine

## 2013-09-10 ENCOUNTER — Encounter: Payer: Self-pay | Admitting: Family Medicine

## 2013-09-10 VITALS — BP 133/83 | HR 79 | Temp 97.2°F | Ht 71.0 in | Wt 196.0 lb

## 2013-09-10 DIAGNOSIS — E785 Hyperlipidemia, unspecified: Secondary | ICD-10-CM

## 2013-09-10 DIAGNOSIS — E559 Vitamin D deficiency, unspecified: Secondary | ICD-10-CM

## 2013-09-10 DIAGNOSIS — H7190 Unspecified cholesteatoma, unspecified ear: Secondary | ICD-10-CM

## 2013-09-10 DIAGNOSIS — I10 Essential (primary) hypertension: Secondary | ICD-10-CM

## 2013-09-10 DIAGNOSIS — D509 Iron deficiency anemia, unspecified: Secondary | ICD-10-CM

## 2013-09-10 DIAGNOSIS — K219 Gastro-esophageal reflux disease without esophagitis: Secondary | ICD-10-CM

## 2013-09-10 DIAGNOSIS — H7192 Unspecified cholesteatoma, left ear: Secondary | ICD-10-CM

## 2013-09-10 LAB — POCT CBC
Granulocyte percent: 68.9 %G (ref 37–80)
HCT, POC: 49.3 % (ref 43.5–53.7)
Hemoglobin: 16.5 g/dL (ref 14.1–18.1)
LYMPH, POC: 1.7 (ref 0.6–3.4)
MCH: 31.2 pg (ref 27–31.2)
MCHC: 33.5 g/dL (ref 31.8–35.4)
MCV: 93.3 fL (ref 80–97)
MPV: 8 fL (ref 0–99.8)
PLATELET COUNT, POC: 208 10*3/uL (ref 142–424)
POC GRANULOCYTE: 4.6 (ref 2–6.9)
POC LYMPH %: 25.9 % (ref 10–50)
RBC: 5.3 M/uL (ref 4.69–6.13)
RDW, POC: 13 %
WBC: 6.7 10*3/uL (ref 4.6–10.2)

## 2013-09-10 NOTE — Progress Notes (Signed)
Subjective:    Patient ID: Maurice Weaver, male    DOB: 01-19-1946, 68 y.o.   MRN: [id number]  HPI Pt here for follow up and management of chronic medical problems. The patient is doing well. He has a history of hearing loss and is scheduled for a sixth surgery on the left ear by the ear nose and throat specialist . He will be given an FOBT to return in August. He will also get his lab work done today.        Patient Active Problem List   Diagnosis Date Noted  . GERD (gastroesophageal reflux disease) 01/23/2013  . Insomnia 01/23/2013  . Anemia, iron deficiency 01/23/2013  . Melanoma in situ of cheek, history of 06/13/2012  . Hearing deficit 06/13/2012  . Hyperlipidemia 12/24/2008  . HYPERTENSION, BENIGN 12/24/2008  . Coronary atherosclerosis of native coronary artery 12/24/2008   Outpatient Encounter Prescriptions as of 09/10/2013  Medication Sig  . aspirin (LONGS ADULT LOW STRENGTH ASA) 81 MG EC tablet Take 81 mg by mouth daily.    . cholecalciferol (VITAMIN D) 1000 UNITS tablet Take 1,000 Units by mouth daily.    . CRESTOR 20 MG tablet TAKE 1 TABLET AT BEDTIME  . ferrous sulfate 325 (65 FE) MG tablet Take 1 tablet (325 mg total) by mouth daily with breakfast.  . hydrochlorothiazide (HYDRODIURIL) 25 MG tablet TAKE 1 TABLET  DAILY  . metoprolol succinate (TOPROL XL) 100 MG 24 hr tablet Take 1 tablet (100 mg total) by mouth daily.  Marland Kitchen omeprazole (PRILOSEC) 40 MG capsule TAKE 1 CAPSULE  DAILY  . potassium chloride (K-DUR,KLOR-CON) 10 MEQ tablet Take 1 tablet (10 mEq total) by mouth 2 (two) times daily.  . ramipril (ALTACE) 10 MG capsule TAKE 1 CAPSULE  DAILY  . ZETIA 10 MG tablet TAKE 1 TABLET AT BEDTIME  . zolpidem (AMBIEN) 10 MG tablet Take 1 tablet (10 mg total) by mouth at bedtime as needed.    Review of Systems  Constitutional: Negative.   HENT: Negative.        Dr Redmond Baseman- scheduled for 6th left ear surgery.   Eyes: Negative.   Respiratory: Negative.     Cardiovascular: Negative.   Gastrointestinal: Negative.   Endocrine: Negative.   Genitourinary: Negative.   Musculoskeletal: Negative.   Skin: Negative.   Allergic/Immunologic: Negative.   Neurological: Negative.   Hematological: Negative.   Psychiatric/Behavioral: Negative.        Objective:   Physical Exam  Nursing note and vitals reviewed. Constitutional: He is oriented to person, place, and time. He appears well-developed and well-nourished.   Pleasant and cooperative.  HENT:  Head: Normocephalic and atraumatic.  Nose: Nose normal.  Mouth/Throat: Oropharynx is clear and moist. No oropharyngeal exudate.  Scar tissue bilateral ear  TMs  Eyes: Conjunctivae and EOM are normal. Pupils are equal, round, and reactive to light. Right eye exhibits no discharge. Left eye exhibits no discharge. No scleral icterus.  Neck: Normal range of motion. Neck supple. No thyromegaly present.  No carotid bruits no neck masses or adenopathy  Cardiovascular: Normal rate, regular rhythm, normal heart sounds and intact distal pulses.  Exam reveals no gallop and no friction rub.   No murmur heard. At 72 per minute  Pulmonary/Chest: Effort normal and breath sounds normal. No respiratory distress. He has no wheezes. He has no rales. He exhibits no tenderness.  Abdominal: Soft. Bowel sounds are normal. He exhibits no mass. There is no tenderness. There is no rebound and  no guarding.  Musculoskeletal: Normal range of motion. He exhibits no edema and no tenderness.  Lymphadenopathy:    He has no cervical adenopathy.  Neurological: He is alert and oriented to person, place, and time. He has normal reflexes. No cranial nerve deficit.  Skin: Skin is warm and dry. No rash noted. No erythema. No pallor.  Psychiatric: He has a normal mood and affect. His behavior is normal. Judgment and thought content normal.   BP 133/83  Pulse 79  Temp(Src) 97.2 F (36.2 C) (Oral)  Ht _0  (1.803 m)  Wt 196 lb (88.905  kg)  BMI 27.35 kg/m2        Assessment & Plan:  1. Anemia, iron deficiency - POCT CBC  2. Gastroesophageal reflux disease, esophagitis presence not specified - POCT CBC  3. Hyperlipidemia - POCT CBC - NMR, lipoprofile  4. HYPERTENSION, BENIGN - POCT CBC - BMP8+EGFR - Hepatic function panel  5. Vitamin D deficiency - Vit D  25 hydroxy (rtn osteoporosis monitoring)  6. Cholesteatoma, left -Surgery is planned soon  No orders of the defined types were placed in this encounter.   Patient Instructions                       Medicare Annual Wellness Visit  [facility] and the medical providers at [facility] strive to bring you the best medical care.  In doing so we not only want to address your current medical conditions and concerns but also to detect new conditions early and prevent illness, disease and health-related problems.    Medicare offers a yearly Wellness Visit which allows our clinical staff to assess your need for preventative services including immunizations, lifestyle education, counseling to decrease risk of preventable diseases and screening for fall risk and other medical concerns.    This visit is provided free of charge (no copay) for all Medicare recipients. The clinical pharmacists at [facility] have begun to conduct these Wellness Visits which will also include a thorough review of all your medications.    As you primary medical provider recommend that you make an appointment for your Annual Wellness Visit if you have not done so already this year.  You may set up this appointment before you leave today or you may call back ([phone]) and schedule an appointment.  Please make sure when you call that you mention that you are scheduling your Annual Wellness Visit with the clinical pharmacist so that the appointment may be made for the proper length of time.     Continue current medications. Continue good  therapeutic lifestyle changes which include good diet and exercise. Fall precautions discussed with patient. If an FOBT was given today- please return it to our front desk. If you are over 54 years old - you may need Prevnar 37 or the adult Pneumonia vaccine.  Continue to be careful and did not put yourself at a risk for falling Drink plenty of fluids especially this summer Return to the FOBT in August Continue your exercise program We will call you with the lab work results since those results are unavailable   Arrie Senate MD

## 2013-09-10 NOTE — Patient Instructions (Addendum)
Medicare Annual Wellness Visit  [facility] and the medical providers at [facility] strive to bring you the best medical care.  In doing so we not only want to address your current medical conditions and concerns but also to detect new conditions early and prevent illness, disease and health-related problems.    Medicare offers a yearly Wellness Visit which allows our clinical staff to assess your need for preventative services including immunizations, lifestyle education, counseling to decrease risk of preventable diseases and screening for fall risk and other medical concerns.    This visit is provided free of charge (no copay) for all Medicare recipients. The clinical pharmacists at [facility] have begun to conduct these Wellness Visits which will also include a thorough review of all your medications.    As you primary medical provider recommend that you make an appointment for your Annual Wellness Visit if you have not done so already this year.  You may set up this appointment before you leave today or you may call back ([phone]) and schedule an appointment.  Please make sure when you call that you mention that you are scheduling your Annual Wellness Visit with the clinical pharmacist so that the appointment may be made for the proper length of time.     Continue current medications. Continue good therapeutic lifestyle changes which include good diet and exercise. Fall precautions discussed with patient. If an FOBT was given today- please return it to our front desk. If you are over 56 years old - you may need Prevnar 74 or the adult Pneumonia vaccine.  Continue to be careful and did not put yourself at a risk for falling Drink plenty of fluids especially this summer Return to the FOBT in August Continue your exercise program We will call you with the lab work results since those results are unavailable

## 2013-09-11 LAB — HEPATIC FUNCTION PANEL
ALT: 31 IU/L (ref 0–44)
AST: 25 IU/L (ref 0–40)
Albumin: 4.7 g/dL (ref 3.6–4.8)
Alkaline Phosphatase: 58 IU/L (ref 39–117)
BILIRUBIN DIRECT: 0.24 mg/dL (ref 0.00–0.40)
TOTAL PROTEIN: 6.5 g/dL (ref 6.0–8.5)
Total Bilirubin: 1.1 mg/dL (ref 0.0–1.2)

## 2013-09-11 LAB — NMR, LIPOPROFILE
CHOLESTEROL: 144 mg/dL (ref 100–199)
HDL Cholesterol by NMR: 35 mg/dL — ABNORMAL LOW (ref >39)
HDL Particle Number: 27.5 umol/L — ABNORMAL LOW (ref >=30.5)
LDL Particle Number: 1045 nmol/L — ABNORMAL HIGH (ref <1000)
LDL SIZE: 20.3 nm (ref >20.5)
LDLC SERPL CALC-MCNC: 71 mg/dL (ref 0–99)
LP-IR SCORE: 85 — AB (ref <=45)
Small LDL Particle Number: 557 nmol/L — ABNORMAL HIGH (ref <=527)
TRIGLYCERIDES BY NMR: 190 mg/dL — AB (ref 0–149)

## 2013-09-11 LAB — BMP8+EGFR
BUN/Creatinine Ratio: 14 (ref 10–22)
BUN: 14 mg/dL (ref 8–27)
CALCIUM: 10.1 mg/dL (ref 8.6–10.2)
CHLORIDE: 101 mmol/L (ref 97–108)
CO2: 24 mmol/L (ref 18–29)
Creatinine, Ser: 0.97 mg/dL (ref 0.76–1.27)
GFR calc Af Amer: 92 mL/min/{1.73_m2} (ref >59)
GFR calc non Af Amer: 80 mL/min/{1.73_m2} (ref >59)
GLUCOSE: 99 mg/dL (ref 65–99)
Potassium: 4.3 mmol/L (ref 3.5–5.2)
SODIUM: 139 mmol/L (ref 134–144)

## 2013-09-11 LAB — VITAMIN D 25 HYDROXY (VIT D DEFICIENCY, FRACTURES): Vit D, 25-Hydroxy: 22.3 ng/mL — ABNORMAL LOW (ref 30.0–100.0)

## 2013-09-12 ENCOUNTER — Other Ambulatory Visit: Payer: Self-pay | Admitting: *Deleted

## 2013-09-12 MED ORDER — VITAMIN D (ERGOCALCIFEROL) 1.25 MG (50000 UNIT) PO CAPS: 50000.0000 [IU] | ORAL_CAPSULE | ORAL | Status: DC

## 2013-09-30 ENCOUNTER — Other Ambulatory Visit: Payer: Self-pay | Admitting: Otolaryngology

## 2013-10-18 ENCOUNTER — Other Ambulatory Visit: Payer: Self-pay | Admitting: Family Medicine

## 2014-01-02 ENCOUNTER — Ambulatory Visit (INDEPENDENT_AMBULATORY_CARE_PROVIDER_SITE_OTHER): Payer: Commercial Managed Care - HMO | Admitting: Family Medicine

## 2014-01-02 ENCOUNTER — Encounter: Payer: Self-pay | Admitting: Family Medicine

## 2014-01-02 ENCOUNTER — Ambulatory Visit (INDEPENDENT_AMBULATORY_CARE_PROVIDER_SITE_OTHER): Payer: Commercial Managed Care - HMO

## 2014-01-02 ENCOUNTER — Encounter: Payer: Self-pay | Admitting: Internal Medicine

## 2014-01-02 ENCOUNTER — Telehealth: Payer: Self-pay

## 2014-01-02 VITALS — BP 118/83 | HR 77 | Temp 98.1°F | Ht 71.0 in | Wt 188.0 lb

## 2014-01-02 DIAGNOSIS — E291 Testicular hypofunction: Secondary | ICD-10-CM | POA: Diagnosis not present

## 2014-01-02 DIAGNOSIS — D509 Iron deficiency anemia, unspecified: Secondary | ICD-10-CM

## 2014-01-02 DIAGNOSIS — E559 Vitamin D deficiency, unspecified: Secondary | ICD-10-CM | POA: Diagnosis not present

## 2014-01-02 DIAGNOSIS — E349 Endocrine disorder, unspecified: Secondary | ICD-10-CM

## 2014-01-02 DIAGNOSIS — Z23 Encounter for immunization: Secondary | ICD-10-CM

## 2014-01-02 DIAGNOSIS — R0602 Shortness of breath: Secondary | ICD-10-CM | POA: Diagnosis not present

## 2014-01-02 DIAGNOSIS — E785 Hyperlipidemia, unspecified: Secondary | ICD-10-CM | POA: Diagnosis not present

## 2014-01-02 DIAGNOSIS — I10 Essential (primary) hypertension: Secondary | ICD-10-CM | POA: Diagnosis not present

## 2014-01-02 DIAGNOSIS — I700 Atherosclerosis of aorta: Secondary | ICD-10-CM

## 2014-01-02 DIAGNOSIS — K219 Gastro-esophageal reflux disease without esophagitis: Secondary | ICD-10-CM

## 2014-01-02 DIAGNOSIS — N400 Enlarged prostate without lower urinary tract symptoms: Secondary | ICD-10-CM

## 2014-01-02 DIAGNOSIS — R5383 Other fatigue: Secondary | ICD-10-CM

## 2014-01-02 DIAGNOSIS — R5381 Other malaise: Secondary | ICD-10-CM

## 2014-01-02 LAB — POCT URINALYSIS DIPSTICK
Bilirubin, UA: NEGATIVE
Blood, UA: NEGATIVE
Glucose, UA: NEGATIVE
Ketones, UA: NEGATIVE
LEUKOCYTES UA: NEGATIVE
NITRITE UA: NEGATIVE
PH UA: 6
PROTEIN UA: NEGATIVE
Spec Grav, UA: 1.02
Urobilinogen, UA: NEGATIVE

## 2014-01-02 LAB — POCT CBC
GRANULOCYTE PERCENT: 70.6 % (ref 37–80)
HCT, POC: 50.8 % (ref 43.5–53.7)
HEMOGLOBIN: 16.8 g/dL (ref 14.1–18.1)
Lymph, poc: 1.9 (ref 0.6–3.4)
MCH, POC: 30.9 pg (ref 27–31.2)
MCHC: 33.2 g/dL (ref 31.8–35.4)
MCV: 93 fL (ref 80–97)
MPV: 7.9 fL (ref 0–99.8)
POC GRANULOCYTE: 5.3 (ref 2–6.9)
POC LYMPH PERCENT: 25.1 %L (ref 10–50)
Platelet Count, POC: 214 10*3/uL (ref 142–424)
RBC: 5.5 M/uL (ref 4.69–6.13)
RDW, POC: 12.7 %
WBC: 7.5 10*3/uL (ref 4.6–10.2)

## 2014-01-02 LAB — POCT UA - MICROSCOPIC ONLY
Bacteria, U Microscopic: NEGATIVE
CRYSTALS, UR, HPF, POC: NEGATIVE
Casts, Ur, LPF, POC: NEGATIVE
Mucus, UA: NEGATIVE
RBC, URINE, MICROSCOPIC: NEGATIVE
WBC, UR, HPF, POC: NEGATIVE
YEAST UA: NEGATIVE

## 2014-01-02 MED ORDER — METOPROLOL SUCCINATE ER 100 MG PO TB24: 100.0000 mg | ORAL_TABLET | Freq: Every day | ORAL | Status: DC

## 2014-01-02 MED ORDER — OMEPRAZOLE 40 MG PO CPDR: DELAYED_RELEASE_CAPSULE | ORAL | Status: DC

## 2014-01-02 MED ORDER — RAMIPRIL 10 MG PO CAPS: ORAL_CAPSULE | ORAL | Status: DC

## 2014-01-02 NOTE — Telephone Encounter (Signed)
Pt aware of CXR results.

## 2014-01-02 NOTE — Progress Notes (Signed)
Subjective:    Patient ID: Maurice Weaver, male    DOB: [birth year], 68 y.o.   MRN: [id number]  HPI Pt here for follow up and management of chronic medical problems. The patient complains of shortness of breath and weakness. He notes that he has had more ear surgery for cholesteatoma he is due today to get lab work, be given an FOBT to return. He is also due for a prostate exam and a flu shot. He requests refills on some of his medication.          Patient Active Problem List   Diagnosis Date Noted  . GERD (gastroesophageal reflux disease) 01/23/2013  . Insomnia 01/23/2013  . Anemia, iron deficiency 01/23/2013  . Melanoma in situ of cheek, history of 06/13/2012  . Hearing deficit 06/13/2012  . Hyperlipidemia 12/24/2008  . HYPERTENSION, BENIGN 12/24/2008  . Coronary atherosclerosis of native coronary artery 12/24/2008   Outpatient Encounter Prescriptions as of 01/02/2014  Medication Sig  . aspirin (LONGS ADULT LOW STRENGTH ASA) 81 MG EC tablet Take 81 mg by mouth daily.    . cholecalciferol (VITAMIN D) 1000 UNITS tablet Take 1,000 Units by mouth daily.    . CRESTOR 20 MG tablet TAKE 1 TABLET AT BEDTIME  . ferrous sulfate 325 (65 FE) MG tablet Take 1 tablet (325 mg total) by mouth daily with breakfast.  . hydrochlorothiazide (HYDRODIURIL) 25 MG tablet TAKE 1 TABLET  DAILY  . metoprolol succinate (TOPROL XL) 100 MG 24 hr tablet Take 1 tablet (100 mg total) by mouth daily.  Marland Kitchen omeprazole (PRILOSEC) 40 MG capsule TAKE 1 CAPSULE  DAILY  . potassium chloride (K-DUR,KLOR-CON) 10 MEQ tablet Take 1 tablet (10 mEq total) by mouth 2 (two) times daily.  . ramipril (ALTACE) 10 MG capsule TAKE 1 CAPSULE  DAILY  . ZETIA 10 MG tablet TAKE 1 TABLET AT BEDTIME  . zolpidem (AMBIEN) 10 MG tablet Take 1 tablet (10 mg total) by mouth at bedtime as needed.  . [DISCONTINUED] Vitamin D, Ergocalciferol, (DRISDOL) 50000 UNITS CAPS capsule Take 1 capsule (50,000 Units total) by mouth every 7 (seven)  days.    Review of Systems  HENT: Negative.  Negative for congestion.   Eyes: Negative.   Respiratory: Positive for shortness of breath. Negative for cough.   Cardiovascular: Negative.  Negative for chest pain and leg swelling.  Gastrointestinal: Negative.   Endocrine: Negative.   Genitourinary: Negative.   Musculoskeletal: Negative.   Skin: Negative.   Allergic/Immunologic: Negative.   Neurological: Positive for weakness.  Hematological: Negative.   Psychiatric/Behavioral: Negative.        Objective:   Physical Exam  Constitutional: He is oriented to person, place, and time. He appears well-developed and well-nourished.  The patient is pleasant, cooperative, and alert as usual. He has bilateral hearing aids with the left being the most beneficial for helping his hearing.  HENT:  Head: Normocephalic and atraumatic.  Right Ear: External ear normal.  Left Ear: External ear normal.  Nose: Nose normal.  Mouth/Throat: Oropharynx is clear and moist. No oropharyngeal exudate.  The patient has cholesteatoma in both ears  Eyes: Conjunctivae and EOM are normal. Pupils are equal, round, and reactive to light. Right eye exhibits no discharge. Left eye exhibits no discharge. No scleral icterus.  Neck: Normal range of motion. Neck supple. No thyromegaly present.  There is no anterior cervical adenopathy or carotid bruits  Cardiovascular: Normal rate, regular rhythm, normal heart sounds and intact distal pulses.  Exam reveals  no gallop and no friction rub.   No murmur heard. At 72/m  Pulmonary/Chest: Effort normal and breath sounds normal. No respiratory distress. He has no wheezes. He has no rales. He exhibits no tenderness.  No axillary adenopathy  Abdominal: Soft. Bowel sounds are normal. He exhibits no mass. There is no tenderness. There is no rebound and no guarding.  Genitourinary: Rectum normal and penis normal. No penile tenderness.  The prostate is slightly enlarged without lumps  or masses. There are no rectal masses. There was no inguinal hernia or inguinal nodes bilaterally. The external genitalia were normal.  Musculoskeletal: Normal range of motion. He exhibits no edema or tenderness.  Lymphadenopathy:    He has no cervical adenopathy.  Neurological: He is alert and oriented to person, place, and time. He has normal reflexes. No cranial nerve deficit.  Skin: Skin is warm and dry. No rash noted. No erythema. No pallor.  Psychiatric: He has a normal mood and affect. His behavior is normal. Judgment and thought content normal.  Nursing note and vitals reviewed.  BP 118/83 mmHg  Pulse 77  Temp(Src) 98.1 F (36.7 C) (Oral)  Ht '5\' 11"'  (1.803 m)  Wt 188 lb (85.276 kg)  BMI 26.23 kg/m2  [facility] reading (PRIMARY) by  Dr.Moore-chest x-ray--no active disease, aortic arch atherosclerosis.                                     Assessment & Plan:  1. HYPERTENSION, BENIGN - POCT CBC - BMP8+EGFR - Hepatic function panel - DG Chest 2 View; Future  2. Vitamin D deficiency - POCT CBC - Vit D  25 hydroxy (rtn osteoporosis monitoring)  3. Gastroesophageal reflux disease, esophagitis presence not specified - POCT CBC  4. Hyperlipemia - POCT CBC - NMR, lipoprofile  5. Testosterone deficiency - POCT CBC - PSA, total and free - POCT UA - Microscopic Only - POCT urinalysis dipstick - Testosterone,Free and Total  6. Anemia, iron deficiency - POCT CBC  7. BPH (benign prostatic hyperplasia) - PSA, total and free - POCT UA - Microscopic Only - POCT urinalysis dipstick - Testosterone,Free and Total  8. Malaise and fatigue - DG Chest 2 View; Future  9. SOB (shortness of breath) - DG Chest 2 View; Future  10.bilateral cholesteatoma and hearing loss  Meds ordered this encounter  Medications  . ramipril (ALTACE) 10 MG capsule    Sig: TAKE 1 CAPSULE  DAILY    Dispense:  90 capsule    Refill:  1  . metoprolol succinate (TOPROL XL) 100 MG 24 hr tablet    Sig:  Take 1 tablet (100 mg total) by mouth daily.    Dispense:  90 tablet    Refill:  1  . DISCONTD: omeprazole (PRILOSEC) 40 MG capsule    Sig: TAKE 1 CAPSULE  DAILY    Dispense:  90 capsule    Refill:  1  . omeprazole (PRILOSEC) 40 MG capsule    Sig: TAKE 1 CAPSULE  DAILY    Dispense:  30 capsule    Refill:  0   Patient Instructions                       Medicare Annual Wellness Visit  [place] and the medical providers at [facility] strive to bring you the best medical care.  In doing so we not only want  to address your current medical conditions and concerns but also to detect new conditions early and prevent illness, disease and health-related problems.    Medicare offers a yearly Wellness Visit which allows our clinical staff to assess your need for preventative services including immunizations, lifestyle education, counseling to decrease risk of preventable diseases and screening for fall risk and other medical concerns.    This visit is provided free of charge (no copay) for all Medicare recipients. The clinical pharmacists at [facility] have begun to conduct these Wellness Visits which will also include a thorough review of all your medications.    As you primary medical provider recommend that you make an appointment for your Annual Wellness Visit if you have not done so already this year.  You may set up this appointment before you leave today or you may call back [phone]) and schedule an appointment.  Please make sure when you call that you mention that you are scheduling your Annual Wellness Visit with the clinical pharmacist so that the appointment may be made for the proper length of time.     Continue current medications. Continue good therapeutic lifestyle changes which include good diet and exercise. Fall precautions discussed with patient. If an FOBT was given today- please return it to our front desk. If you are over 32  years old - you may need Prevnar 23 or the adult Pneumonia vaccine.  Flu Shots will be available at our office starting mid- September. Please call and schedule a FLU CLINIC APPOINTMENT.   Please return the FOBT We will call you with the results of the chest x-ray when those results are available We will also call you with the results of the lab work Drink plenty of fluids Resume walking activity when the weather permits   Arrie Senate MD

## 2014-01-02 NOTE — Telephone Encounter (Signed)
-----   Message from Chipper Herb, MD sent at 01/02/2014  3:26 PM EST ----- As per radiology report

## 2014-01-02 NOTE — Patient Instructions (Addendum)
Medicare Annual Wellness Visit  [facility] and the medical providers at [facility] strive to bring you the best medical care.  In doing so we not only want to address your current medical conditions and concerns but also to detect new conditions early and prevent illness, disease and health-related problems.    Medicare offers a yearly Wellness Visit which allows our clinical staff to assess your need for preventative services including immunizations, lifestyle education, counseling to decrease risk of preventable diseases and screening for fall risk and other medical concerns.    This visit is provided free of charge (no copay) for all Medicare recipients. The clinical pharmacists at [facility] have begun to conduct these Wellness Visits which will also include a thorough review of all your medications.    As you primary medical provider recommend that you make an appointment for your Annual Wellness Visit if you have not done so already this year.  You may set up this appointment before you leave today or you may call back ([phone]) and schedule an appointment.  Please make sure when you call that you mention that you are scheduling your Annual Wellness Visit with the clinical pharmacist so that the appointment may be made for the proper length of time.     Continue current medications. Continue good therapeutic lifestyle changes which include good diet and exercise. Fall precautions discussed with patient. If an FOBT was given today- please return it to our front desk. If you are over 73 years old - you may need Prevnar 58 or the adult Pneumonia vaccine.  Flu Shots will be available at our office starting mid- September. Please call and schedule a FLU CLINIC APPOINTMENT.   Please return the FOBT We will call you with the results of the chest x-ray when those results are available We will also call you with the results of  the lab work Drink plenty of fluids Resume walking activity when the weather permits

## 2014-01-03 LAB — HEPATIC FUNCTION PANEL
ALBUMIN: 4.5 g/dL (ref 3.6–4.8)
ALK PHOS: 63 IU/L (ref 39–117)
ALT: 24 IU/L (ref 0–44)
AST: 22 IU/L (ref 0–40)
BILIRUBIN DIRECT: 0.28 mg/dL (ref 0.00–0.40)
TOTAL PROTEIN: 6.9 g/dL (ref 6.0–8.5)
Total Bilirubin: 1.4 mg/dL — ABNORMAL HIGH (ref 0.0–1.2)

## 2014-01-03 LAB — BMP8+EGFR
BUN / CREAT RATIO: 12 (ref 10–22)
BUN: 13 mg/dL (ref 8–27)
CHLORIDE: 98 mmol/L (ref 97–108)
CO2: 24 mmol/L (ref 18–29)
Calcium: 10.3 mg/dL — ABNORMAL HIGH (ref 8.6–10.2)
Creatinine, Ser: 1.08 mg/dL (ref 0.76–1.27)
GFR, EST AFRICAN AMERICAN: 81 mL/min/{1.73_m2} (ref >59)
GFR, EST NON AFRICAN AMERICAN: 70 mL/min/{1.73_m2} (ref >59)
GLUCOSE: 103 mg/dL — AB (ref 65–99)
Potassium: 4.2 mmol/L (ref 3.5–5.2)
Sodium: 139 mmol/L (ref 134–144)

## 2014-01-03 LAB — NMR, LIPOPROFILE
Cholesterol: 124 mg/dL (ref 100–199)
HDL CHOLESTEROL BY NMR: 38 mg/dL — AB (ref >39)
HDL PARTICLE NUMBER: 26.2 umol/L — AB (ref >=30.5)
LDL Particle Number: 942 nmol/L (ref <1000)
LDL Size: 20.5 nm (ref >20.5)
LDL-C: 53 mg/dL (ref 0–99)
LP-IR Score: 67 — ABNORMAL HIGH (ref <=45)
Small LDL Particle Number: 459 nmol/L (ref <=527)
TRIGLYCERIDES BY NMR: 163 mg/dL — AB (ref 0–149)

## 2014-01-03 LAB — PSA, TOTAL AND FREE
PSA, Free Pct: 26 %
PSA, Free: 0.52 ng/mL
PSA: 2 ng/mL (ref 0.0–4.0)

## 2014-01-03 LAB — TESTOSTERONE,FREE AND TOTAL
Testosterone, Free: 4.7 pg/mL — ABNORMAL LOW (ref 6.6–18.1)
Testosterone: 321 ng/dL — ABNORMAL LOW (ref 348–1197)

## 2014-01-03 LAB — VITAMIN D 25 HYDROXY (VIT D DEFICIENCY, FRACTURES): VIT D 25 HYDROXY: 35.2 ng/mL (ref 30.0–100.0)

## 2014-01-06 ENCOUNTER — Other Ambulatory Visit: Payer: Commercial Managed Care - HMO

## 2014-01-06 DIAGNOSIS — Z1212 Encounter for screening for malignant neoplasm of rectum: Secondary | ICD-10-CM

## 2014-01-06 NOTE — Progress Notes (Signed)
LAB ONLY 

## 2014-01-07 LAB — FECAL OCCULT BLOOD, IMMUNOCHEMICAL: Fecal Occult Bld: NEGATIVE

## 2014-04-30 ENCOUNTER — Telehealth: Payer: Self-pay | Admitting: Family Medicine

## 2014-05-14 ENCOUNTER — Ambulatory Visit: Payer: Commercial Managed Care - HMO | Admitting: *Deleted

## 2014-05-14 ENCOUNTER — Encounter: Payer: Self-pay | Admitting: *Deleted

## 2014-05-20 ENCOUNTER — Ambulatory Visit: Payer: Commercial Managed Care - HMO | Admitting: Family Medicine

## 2014-05-20 ENCOUNTER — Other Ambulatory Visit: Payer: Self-pay | Admitting: Family Medicine

## 2014-05-27 ENCOUNTER — Encounter: Payer: Self-pay | Admitting: *Deleted

## 2014-05-27 ENCOUNTER — Ambulatory Visit (INDEPENDENT_AMBULATORY_CARE_PROVIDER_SITE_OTHER): Payer: Commercial Managed Care - HMO | Admitting: *Deleted

## 2014-05-27 VITALS — BP 124/86 | HR 69 | Resp 20 | Ht 70.5 in | Wt 187.6 lb

## 2014-05-27 DIAGNOSIS — Z0000 Encounter for general adult medical examination without abnormal findings: Secondary | ICD-10-CM | POA: Diagnosis not present

## 2014-05-27 NOTE — Patient Instructions (Signed)
Preventive Care for Adults A healthy lifestyle and preventive care can promote health and wellness. Preventive health guidelines for men include the following key practices:  A routine yearly physical is a good way to check with your health care provider about your health and preventative screening. It is a chance to share any concerns and updates on your health and to receive a thorough exam.  Visit your dentist for a routine exam and preventative care every 6 months. Brush your teeth twice a day and floss once a day. Good oral hygiene prevents tooth decay and gum disease.  The frequency of eye exams is based on your age, health, family medical history, use of contact lenses, and other factors. Follow your health care provider's recommendations for frequency of eye exams.  Eat a healthy diet. Foods such as vegetables, fruits, whole grains, low-fat dairy products, and lean protein foods contain the nutrients you need without too many calories. Decrease your intake of foods high in solid fats, added sugars, and salt. Eat the right amount of calories for you.Get information about a proper diet from your health care provider, if necessary.  Regular physical exercise is one of the most important things you can do for your health. Most adults should get at least 150 minutes of moderate-intensity exercise (any activity that increases your heart rate and causes you to sweat) each week. In addition, most adults need muscle-strengthening exercises on 2 or more days a week.  Maintain a healthy weight. The body mass index (BMI) is a screening tool to identify possible weight problems. It provides an estimate of body fat based on height and weight. Your health care provider can find your BMI and can help you achieve or maintain a healthy weight.For adults 20 years and older:  A BMI below 18.5 is considered underweight.  A BMI of 18.5 to 24.9 is normal.  A BMI of 25 to 29.9 is considered overweight.  A BMI  of 30 and above is considered obese.  Maintain normal blood lipids and cholesterol levels by exercising and minimizing your intake of saturated fat. Eat a balanced diet with plenty of fruit and vegetables. Blood tests for lipids and cholesterol should begin at age 50 and be repeated every 5 years. If your lipid or cholesterol levels are high, you are over 50, or you are at high risk for heart disease, you may need your cholesterol levels checked more frequently.Ongoing high lipid and cholesterol levels should be treated with medicines if diet and exercise are not working.  If you smoke, find out from your health care provider how to quit. If you do not use tobacco, do not start.  Lung cancer screening is recommended for adults aged 73-80 years who are at high risk for developing lung cancer because of a history of smoking. A yearly low-dose CT scan of the lungs is recommended for people who have at least a 30-pack-year history of smoking and are a current smoker or have quit within the past 15 years. A pack year of smoking is smoking an average of 1 pack of cigarettes a day for 1 year (for example: 1 pack a day for 30 years or 2 packs a day for 15 years). Yearly screening should continue until the smoker has stopped smoking for at least 15 years. Yearly screening should be stopped for people who develop a health problem that would prevent them from having lung cancer treatment.  If you choose to drink alcohol, do not have more than  2 drinks per day. One drink is considered to be 12 ounces (355 mL) of beer, 5 ounces (148 mL) of wine, or 1.5 ounces (44 mL) of liquor.  Avoid use of street drugs. Do not share needles with anyone. Ask for help if you need support or instructions about stopping the use of drugs.  High blood pressure causes heart disease and increases the risk of stroke. Your blood pressure should be checked at least every 1-2 years. Ongoing high blood pressure should be treated with  medicines, if weight loss and exercise are not effective.  If you are 45-79 years old, ask your health care provider if you should take aspirin to prevent heart disease.  Diabetes screening involves taking a blood sample to check your fasting blood sugar level. This should be done once every 3 years, after age 45, if you are within normal weight and without risk factors for diabetes. Testing should be considered at a younger age or be carried out more frequently if you are overweight and have at least 1 risk factor for diabetes.  Colorectal cancer can be detected and often prevented. Most routine colorectal cancer screening begins at the age of 50 and continues through age 75. However, your health care provider may recommend screening at an earlier age if you have risk factors for colon cancer. On a yearly basis, your health care provider may provide home test kits to check for hidden blood in the stool. Use of a small camera at the end of a tube to directly examine the colon (sigmoidoscopy or colonoscopy) can detect the earliest forms of colorectal cancer. Talk to your health care provider about this at age 50, when routine screening begins. Direct exam of the colon should be repeated every 5-10 years through age 75, unless early forms of precancerous polyps or small growths are found.  People who are at an increased risk for hepatitis B should be screened for this virus. You are considered at high risk for hepatitis B if:  You were born in a country where hepatitis B occurs often. Talk with your health care provider about which countries are considered high risk.  Your parents were born in a high-risk country and you have not received a shot to protect against hepatitis B (hepatitis B vaccine).  You have HIV or AIDS.  You use needles to inject street drugs.  You live with, or have sex with, someone who has hepatitis B.  You are a man who has sex with other men (MSM).  You get hemodialysis  treatment.  You take certain medicines for conditions such as cancer, organ transplantation, and autoimmune conditions.  Hepatitis C blood testing is recommended for all people born from 1945 through 1965 and any individual with known risks for hepatitis C.  Practice safe sex. Use condoms and avoid high-risk sexual practices to reduce the spread of sexually transmitted infections (STIs). STIs include gonorrhea, chlamydia, syphilis, trichomonas, herpes, HPV, and human immunodeficiency virus (HIV). Herpes, HIV, and HPV are viral illnesses that have no cure. They can result in disability, cancer, and death.  If you are at risk of being infected with HIV, it is recommended that you take a prescription medicine daily to prevent HIV infection. This is called preexposure prophylaxis (PrEP). You are considered at risk if:  You are a man who has sex with other men (MSM) and have other risk factors.  You are a heterosexual man, are sexually active, and are at increased risk for HIV infection.    You take drugs by injection.  You are sexually active with a partner who has HIV.  Talk with your health care provider about whether you are at high risk of being infected with HIV. If you choose to begin PrEP, you should first be tested for HIV. You should then be tested every 3 months for as long as you are taking PrEP.  A one-time screening for abdominal aortic aneurysm (AAA) and surgical repair of large AAAs by ultrasound are recommended for men ages 32 to 67 years who are current or former smokers.  Healthy men should no longer receive prostate-specific antigen (PSA) blood tests as part of routine cancer screening. Talk with your health care provider about prostate cancer screening.  Testicular cancer screening is not recommended for adult males who have no symptoms. Screening includes self-exam, a health care provider exam, and other screening tests. Consult with your health care provider about any symptoms  you have or any concerns you have about testicular cancer.  Use sunscreen. Apply sunscreen liberally and repeatedly throughout the day. You should seek shade when your shadow is shorter than you. Protect yourself by wearing long sleeves, pants, a wide-brimmed hat, and sunglasses year round, whenever you are outdoors.  Once a month, do a whole-body skin exam, using a mirror to look at the skin on your back. Tell your health care provider about new moles, moles that have irregular borders, moles that are larger than a pencil eraser, or moles that have changed in shape or color.  Stay current with required vaccines (immunizations).  Influenza vaccine. All adults should be immunized every year.  Tetanus, diphtheria, and acellular pertussis (Td, Tdap) vaccine. An adult who has not previously received Tdap or who does not know his vaccine status should receive 1 dose of Tdap. This initial dose should be followed by tetanus and diphtheria toxoids (Td) booster doses every 10 years. Adults with an unknown or incomplete history of completing a 3-dose immunization series with Td-containing vaccines should begin or complete a primary immunization series including a Tdap dose. Adults should receive a Td booster every 10 years.  Varicella vaccine. An adult without evidence of immunity to varicella should receive 2 doses or a second dose if he has previously received 1 dose.  Human papillomavirus (HPV) vaccine. Males aged 68-21 years who have not received the vaccine previously should receive the 3-dose series. Males aged 22-26 years may be immunized. Immunization is recommended through the age of 6 years for any male who has sex with males and did not get any or all doses earlier. Immunization is recommended for any person with an immunocompromised condition through the age of 49 years if he did not get any or all doses earlier. During the 3-dose series, the second dose should be obtained 4-8 weeks after the first  dose. The third dose should be obtained 24 weeks after the first dose and 16 weeks after the second dose.  Zoster vaccine. One dose is recommended for adults aged 50 years or older unless certain conditions are present.  Measles, mumps, and rubella (MMR) vaccine. Adults born before [birth year] generally are considered immune to measles and mumps. Adults born in [birth year] or later should have 1 or more doses of MMR vaccine unless there is a contraindication to the vaccine or there is laboratory evidence of immunity to each of the three diseases. A routine second dose of MMR vaccine should be obtained at least 28 days after the first dose for students attending postsecondary  schools, health care workers, or international travelers. People who received inactivated measles vaccine or an unknown type of measles vaccine during 1963-1967 should receive 2 doses of MMR vaccine. People who received inactivated mumps vaccine or an unknown type of mumps vaccine before 1979 and are at high risk for mumps infection should consider immunization with 2 doses of MMR vaccine. Unvaccinated health care workers born before 1957 who lack laboratory evidence of measles, mumps, or rubella immunity or laboratory confirmation of disease should consider measles and mumps immunization with 2 doses of MMR vaccine or rubella immunization with 1 dose of MMR vaccine.  Pneumococcal 13-valent conjugate (PCV13) vaccine. When indicated, a person who is uncertain of his immunization history and has no record of immunization should receive the PCV13 vaccine. An adult aged 19 years or older who has certain medical conditions and has not been previously immunized should receive 1 dose of PCV13 vaccine. This PCV13 should be followed with a dose of pneumococcal polysaccharide (PPSV23) vaccine. The PPSV23 vaccine dose should be obtained at least 8 weeks after the dose of PCV13 vaccine. An adult aged 19 years or older who has certain medical conditions and  previously received 1 or more doses of PPSV23 vaccine should receive 1 dose of PCV13. The PCV13 vaccine dose should be obtained 1 or more years after the last PPSV23 vaccine dose.  Pneumococcal polysaccharide (PPSV23) vaccine. When PCV13 is also indicated, PCV13 should be obtained first. All adults aged 65 years and older should be immunized. An adult younger than age 65 years who has certain medical conditions should be immunized. Any person who resides in a nursing home or long-term care facility should be immunized. An adult smoker should be immunized. People with an immunocompromised condition and certain other conditions should receive both PCV13 and PPSV23 vaccines. People with human immunodeficiency virus (HIV) infection should be immunized as soon as possible after diagnosis. Immunization during chemotherapy or radiation therapy should be avoided. Routine use of PPSV23 vaccine is not recommended for American Indians, [place] Natives, or people younger than 65 years unless there are medical conditions that require PPSV23 vaccine. When indicated, people who have unknown immunization and have no record of immunization should receive PPSV23 vaccine. One-time revaccination 5 years after the first dose of PPSV23 is recommended for people aged 19-64 years who have chronic kidney failure, nephrotic syndrome, asplenia, or immunocompromised conditions. People who received 1-2 doses of PPSV23 before age 65 years should receive another dose of PPSV23 vaccine at age 65 years or later if at least 5 years have passed since the previous dose. Doses of PPSV23 are not needed for people immunized with PPSV23 at or after age 65 years.  Meningococcal vaccine. Adults with asplenia or persistent complement component deficiencies should receive 2 doses of quadrivalent meningococcal conjugate (MenACWY-D) vaccine. The doses should be obtained at least 2 months apart. Microbiologists working with certain meningococcal bacteria,  military recruits, people at risk during an outbreak, and people who travel to or live in countries with a high rate of meningitis should be immunized. A first-year college student up through age 21 years who is living in a residence hall should receive a dose if he did not receive a dose on or after his 16th birthday. Adults who have certain high-risk conditions should receive one or more doses of vaccine.  Hepatitis A vaccine. Adults who wish to be protected from this disease, have certain high-risk conditions, work with hepatitis A-infected animals, work in hepatitis A research labs, or   travel to or work in countries with a high rate of hepatitis A should be immunized. Adults who were previously unvaccinated and who anticipate close contact with an international adoptee during the first 60 days after arrival in the [place] from a country with a high rate of hepatitis A should be immunized.  Hepatitis B vaccine. Adults should be immunized if they wish to be protected from this disease, have certain high-risk conditions, may be exposed to blood or other infectious body fluids, are household contacts or sex partners of hepatitis B positive people, are clients or workers in certain care facilities, or travel to or work in countries with a high rate of hepatitis B.  Haemophilus influenzae type b (Hib) vaccine. A previously unvaccinated person with asplenia or sickle cell disease or having a scheduled splenectomy should receive 1 dose of Hib vaccine. Regardless of previous immunization, a recipient of a hematopoietic stem cell transplant should receive a 3-dose series 6-12 months after his successful transplant. Hib vaccine is not recommended for adults with HIV infection. Preventive Service / Frequency Ages 52 to 17  Blood pressure check.** / Every 1 to 2 years.  Lipid and cholesterol check.** / Every 5 years beginning at age 69.  Hepatitis C blood test.** / For any individual with known risks for  hepatitis C.  Skin self-exam. / Monthly.  Influenza vaccine. / Every year.  Tetanus, diphtheria, and acellular pertussis (Tdap, Td) vaccine.** / Consult your health care provider. 1 dose of Td every 10 years.  Varicella vaccine.** / Consult your health care provider.  HPV vaccine. / 3 doses over 6 months, if 72 or younger.  Measles, mumps, rubella (MMR) vaccine.** / You need at least 1 dose of MMR if you were born in 1957 or later. You may also need a second dose.  Pneumococcal 13-valent conjugate (PCV13) vaccine.** / Consult your health care provider.  Pneumococcal polysaccharide (PPSV23) vaccine.** / 1 to 2 doses if you smoke cigarettes or if you have certain conditions.  Meningococcal vaccine.** / 1 dose if you are age 35 to 60 years and a Market researcher living in a residence hall, or have one of several medical conditions. You may also need additional booster doses.  Hepatitis A vaccine.** / Consult your health care provider.  Hepatitis B vaccine.** / Consult your health care provider.  Haemophilus influenzae type b (Hib) vaccine.** / Consult your health care provider. Ages 35 to 8  Blood pressure check.** / Every 1 to 2 years.  Lipid and cholesterol check.** / Every 5 years beginning at age 57.  Lung cancer screening. / Every year if you are aged 44-80 years and have a 30-pack-year history of smoking and currently smoke or have quit within the past 15 years. Yearly screening is stopped once you have quit smoking for at least 15 years or develop a health problem that would prevent you from having lung cancer treatment.  Fecal occult blood test (FOBT) of stool. / Every year beginning at age 55 and continuing until age 73. You may not have to do this test if you get a colonoscopy every 10 years.  Flexible sigmoidoscopy** or colonoscopy.** / Every 5 years for a flexible sigmoidoscopy or every 10 years for a colonoscopy beginning at age 28 and continuing until age  1.  Hepatitis C blood test.** / For all people born from [birth year] through 1965 and any individual with known risks for hepatitis C.  Skin self-exam. / Monthly.  Influenza vaccine. / Every  year.  Tetanus, diphtheria, and acellular pertussis (Tdap/Td) vaccine.** / Consult your health care provider. 1 dose of Td every 10 years.  Varicella vaccine.** / Consult your health care provider.  Zoster vaccine.** / 1 dose for adults aged 53 years or older.  Measles, mumps, rubella (MMR) vaccine.** / You need at least 1 dose of MMR if you were born in 1957 or later. You may also need a second dose.  Pneumococcal 13-valent conjugate (PCV13) vaccine.** / Consult your health care provider.  Pneumococcal polysaccharide (PPSV23) vaccine.** / 1 to 2 doses if you smoke cigarettes or if you have certain conditions.  Meningococcal vaccine.** / Consult your health care provider.  Hepatitis A vaccine.** / Consult your health care provider.  Hepatitis B vaccine.** / Consult your health care provider.  Haemophilus influenzae type b (Hib) vaccine.** / Consult your health care provider. Ages 77 and over  Blood pressure check.** / Every 1 to 2 years.  Lipid and cholesterol check.**/ Every 5 years beginning at age 85.  Lung cancer screening. / Every year if you are aged 55-80 years and have a 30-pack-year history of smoking and currently smoke or have quit within the past 15 years. Yearly screening is stopped once you have quit smoking for at least 15 years or develop a health problem that would prevent you from having lung cancer treatment.  Fecal occult blood test (FOBT) of stool. / Every year beginning at age 33 and continuing until age 11. You may not have to do this test if you get a colonoscopy every 10 years.  Flexible sigmoidoscopy** or colonoscopy.** / Every 5 years for a flexible sigmoidoscopy or every 10 years for a colonoscopy beginning at age 28 and continuing until age 73.  Hepatitis C blood  test.** / For all people born from [birth year] through 1965 and any individual with known risks for hepatitis C.  Abdominal aortic aneurysm (AAA) screening.** / A one-time screening for ages 50 to 27 years who are current or former smokers.  Skin self-exam. / Monthly.  Influenza vaccine. / Every year.  Tetanus, diphtheria, and acellular pertussis (Tdap/Td) vaccine.** / 1 dose of Td every 10 years.  Varicella vaccine.** / Consult your health care provider.  Zoster vaccine.** / 1 dose for adults aged 34 years or older.  Pneumococcal 13-valent conjugate (PCV13) vaccine.** / Consult your health care provider.  Pneumococcal polysaccharide (PPSV23) vaccine.** / 1 dose for all adults aged 63 years and older.  Meningococcal vaccine.** / Consult your health care provider.  Hepatitis A vaccine.** / Consult your health care provider.  Hepatitis B vaccine.** / Consult your health care provider.  Haemophilus influenzae type b (Hib) vaccine.** / Consult your health care provider. **Family history and personal history of risk and conditions may change your health care provider's recommendations. Document Released: 04/12/2001 Document Revised: 02/19/2013 Document Reviewed: 07/12/2010 [facility] Patient Information 2015 [place], [place]. This information is not intended to replace advice given to you by your health care provider. Make sure you discuss any questions you have with your health care provider.

## 2014-05-27 NOTE — Progress Notes (Signed)
Subjective:   Maurice Weaver is a 69 y.o. male who presents for an Initial Medicare Annual Wellness Visit.  Review of Systems  Cardiac Risk Factors include: advanced age (>76men, >64 women);dyslipidemia;hypertension;male gender;smoking/ tobacco exposure    Objective:    Today's Vitals   05/27/14 0908  BP: 124/86  Pulse: 69  Resp: 20  Height: 5' 10.5" (1.791 m)  Weight: 187 lb 9.6 oz (85.095 kg)  PainSc: 0-No pain    Current Medications (verified) Outpatient Encounter Prescriptions as of 05/27/2014  Medication Sig  . aspirin (LONGS ADULT LOW STRENGTH ASA) 81 MG EC tablet Take 81 mg by mouth daily.    . cholecalciferol (VITAMIN D) 1000 UNITS tablet Take 1,000 Units by mouth daily.    . CRESTOR 20 MG tablet TAKE 1 TABLET AT BEDTIME  . ferrous sulfate 325 (65 FE) MG tablet Take 1 tablet (325 mg total) by mouth daily with breakfast.  . hydrochlorothiazide (HYDRODIURIL) 25 MG tablet TAKE 1 TABLET  DAILY  . metoprolol succinate (TOPROL XL) 100 MG 24 hr tablet Take 1 tablet (100 mg total) by mouth daily.  Marland Kitchen omeprazole (PRILOSEC) 40 MG capsule TAKE 1 CAPSULE  DAILY  . potassium chloride (K-DUR,KLOR-CON) 10 MEQ tablet Take 1 tablet (10 mEq total) by mouth 2 (two) times daily.  . ramipril (ALTACE) 10 MG capsule TAKE 1 CAPSULE  DAILY  . ZETIA 10 MG tablet TAKE 1 TABLET AT BEDTIME  . zolpidem (AMBIEN) 10 MG tablet Take 1 tablet (10 mg total) by mouth at bedtime as needed.    Allergies (verified) Ampicillin and Erythromycin   History: Past Medical History  Diagnosis Date  . Hiatal hernia   . Diverticulosis   . Hyperlipidemia   . AF (atrial fibrillation)   . Asthma     stable  . Decreased hearing   . Nephrolithiasis   . CAD (coronary artery disease)   . ED (erectile dysfunction)   . History of ETT 12/1993  . Decreased radial pulse   . Cholesteatoma   . S/P angioplasty   . GERD (gastroesophageal reflux disease)   . Fundic gland polyps of stomach, benign   .  Schatzki's ring   . MRSA (methicillin resistant staph aureus) culture positive 07/2009  . Diverticulitis of colon     recurrent w/abscess - resected 2011  . Hypertension   . Vitamin D deficiency   . Melanoma 2013    Right Cheek   Past Surgical History  Procedure Laterality Date  . Colonoscopy  2005  . Esophagogastroduodenoscopy  2005  . Tonsillectomy and adenoidectomy    . Coronary angioplasty with stent placement  2000  . Melanoma excision  2012  . Colon surgery  2011    diverticulitis left resection-Toth  . Inner ear surgery Bilateral 2015    x6  cholesteatomas   Family History  Problem Relation Age of Onset  . Colon cancer Neg Hx   . Pancreatic cancer Mother   . Hypertension Mother   . Heart disease Father     Died of MI age 38  . Urolithiasis Brother   . Arthritis Brother   . Alzheimer's disease Sister    Social History   Occupational History  . Electronics    Social History Main Topics  . Smoking status: Former Smoker -- 0.50 packs/day for 30 years    Types: Cigarettes    Start date: 02/29/1960    Quit date: 04/29/1998  . Smokeless tobacco: Never Used  Comment: Patient quit "cold [place]" in 2000  . Alcohol Use: No  . Drug Use: No  . Sexual Activity: Not Currently   Tobacco Counseling Counseling given: No   Activities of Daily Living In your present state of health, do you have any difficulty performing the following activities: 05/27/2014  Is the patient deaf or have difficulty hearing? Y  Hearing N  Vision N  Difficulty concentrating or making decisions N  Walking or climbing stairs? N  Doing errands, shopping? N  Preparing Food and eating ? N  Using the Toilet? N  In the past six months, have you accidently leaked urine? N  Do you have problems with loss of bowel control? N  Managing your Medications? N  Managing your Finances? N  Housekeeping or managing your Housekeeping? N    Immunizations and Health Maintenance Immunization History    Administered Date(s) Administered  . Influenza,inj,quad, With Preservative 01/02/2014  . Influenza-Unspecified 01/23/2013  . Pneumococcal Conjugate-13 01/23/2013  . Pneumococcal Polysaccharide-23 08/29/2010  . Tdap 06/28/2008  . Zoster 11/28/2005   Health Maintenance Due  Topic Date Due  . COLON CANCER SCREENING ANNUAL FOBT  09/28/2013    Patient Care Team: Chipper Herb, MD as PCP - General (Family Medicine) Melida Quitter, MD as Attending Physician (Otolaryngology) Gatha Mayer, MD as Attending Physician (Gastroenterology)  Indicate any recent Medical Services you may have received from other than [facility] providers in the past year (date may be approximate).    Assessment:   This is a routine wellness examination for Maurice Weaver.  Hearing/Vision screen Has Bilateral Hearting Aides  Has not been for eye exam in over 2 years. I offered to call and make him an appointment but he refuses at this time and said he would call when he is ready. Uses [facility]  In [place].  Dietary issues and exercise activities discussed: Current Exercise Habits:: Home exercise routine, Type of exercise: walking;Other - see comments (plays golf 2 times a week weather permitting), Time (Minutes): > 60, Frequency (Times/Week): 5, Weekly Exercise (Minutes/Week): 0, Intensity: Moderate  Goals    None     Depression Screen PHQ 2/9 Scores 05/27/2014 09/10/2013  PHQ - 2 Score 0 0    Fall Risk Fall Risk  05/27/2014 09/10/2013 10/15/2012  Falls in the past year? No Yes No  Number falls in past yr: - 1 -  Injury with Fall? - No -    Cognitive Function: MMSE - Mini Mental State Exam 05/27/2014  Not completed: Refused  Orientation to time 5  Orientation to Place 5  Registration 1  Attention/ Calculation 4  Recall 3  Language- name 2 objects 2  Language- repeat 1  Language- follow 3 step command 3  Language- read & follow direction 1  Write a sentence 1  Copy design 1  Total score 27     Screening  Tests Health Maintenance  Topic Date Due  . COLON CANCER SCREENING ANNUAL FOBT  09/28/2013  . INFLUENZA VACCINE  09/29/2014  . TETANUS/TDAP  06/29/2018  . COLONOSCOPY  12/28/2021  . ZOSTAVAX  Completed  . PNA vac Low Risk Adult  Completed        Plan:  During the course of the visit Deval was educated and counseled about the following appropriate screening and preventive services:   Vaccines to include Pneumoccal, Influenza, Tdap, Zostavax all vaccine are up to date  Colorectal cancer screening : UTD  FOBT due 12/2014  Cardiovascular disease screening appt with Dr.  Laurance Flatten 06/09/14 has not seen Cardiologist in a couple of years. Encouraged patient to call for a routine visit and to discuss at his appointment with Dr. Laurance Flatten next month  Glaucoma screening : patient needs routine eye exam but refuses at this time and would not let me call and make him an appointment. He reports he will call.  Nutrition counseling  Prostate cancer screening: has appointment with Dr. Laurance Flatten 06/09/2014  Patient Instructions (the written plan) were given to the patient.   Joneen Boers, RN   05/27/2014       I have reviewed and agree with the above AWV documentation.  Claretta Fraise, M.D.

## 2014-06-09 ENCOUNTER — Ambulatory Visit (INDEPENDENT_AMBULATORY_CARE_PROVIDER_SITE_OTHER): Payer: Commercial Managed Care - HMO | Admitting: Family Medicine

## 2014-06-09 ENCOUNTER — Encounter: Payer: Self-pay | Admitting: Family Medicine

## 2014-06-09 VITALS — BP 136/92 | HR 79 | Temp 98.4°F | Ht 70.5 in | Wt 185.0 lb

## 2014-06-09 DIAGNOSIS — R0789 Other chest pain: Secondary | ICD-10-CM | POA: Diagnosis not present

## 2014-06-09 DIAGNOSIS — Z85820 Personal history of malignant melanoma of skin: Secondary | ICD-10-CM

## 2014-06-09 DIAGNOSIS — Z9889 Other specified postprocedural states: Secondary | ICD-10-CM

## 2014-06-09 DIAGNOSIS — E559 Vitamin D deficiency, unspecified: Secondary | ICD-10-CM

## 2014-06-09 DIAGNOSIS — D509 Iron deficiency anemia, unspecified: Secondary | ICD-10-CM | POA: Diagnosis not present

## 2014-06-09 DIAGNOSIS — E785 Hyperlipidemia, unspecified: Secondary | ICD-10-CM

## 2014-06-09 DIAGNOSIS — H7191 Unspecified cholesteatoma, right ear: Secondary | ICD-10-CM | POA: Diagnosis not present

## 2014-06-09 DIAGNOSIS — I10 Essential (primary) hypertension: Secondary | ICD-10-CM | POA: Diagnosis not present

## 2014-06-09 DIAGNOSIS — K219 Gastro-esophageal reflux disease without esophagitis: Secondary | ICD-10-CM

## 2014-06-09 LAB — POCT CBC
GRANULOCYTE PERCENT: 64 % (ref 37–80)
HEMATOCRIT: 54.2 % — AB (ref 43.5–53.7)
Hemoglobin: 17.4 g/dL (ref 14.1–18.1)
Lymph, poc: 2.6 (ref 0.6–3.4)
MCH, POC: 29.7 pg (ref 27–31.2)
MCHC: 32.2 g/dL (ref 31.8–35.4)
MCV: 92.5 fL (ref 80–97)
MPV: 7.9 fL (ref 0–99.8)
POC GRANULOCYTE: 5.4 (ref 2–6.9)
POC LYMPH %: 30.5 % (ref 10–50)
Platelet Count, POC: 236 10*3/uL (ref 142–424)
RBC: 5.86 M/uL (ref 4.69–6.13)
RDW, POC: 12.8 %
WBC: 8.4 10*3/uL (ref 4.6–10.2)

## 2014-06-09 MED ORDER — POTASSIUM CHLORIDE CRYS ER 10 MEQ PO TBCR: 10.0000 meq | EXTENDED_RELEASE_TABLET | Freq: Every day | ORAL | Status: DC

## 2014-06-09 MED ORDER — HYDROCHLOROTHIAZIDE 25 MG PO TABS: 25.0000 mg | ORAL_TABLET | Freq: Every day | ORAL | Status: DC

## 2014-06-09 MED ORDER — METOPROLOL SUCCINATE ER 100 MG PO TB24: 100.0000 mg | ORAL_TABLET | Freq: Every day | ORAL | Status: DC

## 2014-06-09 MED ORDER — OMEPRAZOLE 40 MG PO CPDR: DELAYED_RELEASE_CAPSULE | ORAL | Status: DC

## 2014-06-09 NOTE — Progress Notes (Signed)
Subjective:    Patient ID: Maurice Weaver, male    DOB: [birth year], 69 y.o.   MRN: [id number]  HPI Pt here for follow up and management of chronic medical problems which includes hypertension and hyperlipidemia. He is taking medications regularly. The patient comes in today for routine follow-up. He is retired now. He is due to get lab work. He is up-to-date on all of his health maintenance issues.         Patient Active Problem List   Diagnosis Date Noted  . GERD (gastroesophageal reflux disease) 01/23/2013  . Insomnia 01/23/2013  . Anemia, iron deficiency 01/23/2013  . Melanoma in situ of cheek, history of 06/13/2012  . Hearing deficit 06/13/2012  . Hyperlipidemia 12/24/2008  . HYPERTENSION, BENIGN 12/24/2008  . Coronary atherosclerosis of native coronary artery 12/24/2008   Outpatient Encounter Prescriptions as of 06/09/2014  Medication Sig  . aspirin (LONGS ADULT LOW STRENGTH ASA) 81 MG EC tablet Take 81 mg by mouth daily.    . cholecalciferol (VITAMIN D) 1000 UNITS tablet Take 1,000 Units by mouth daily.    . CRESTOR 20 MG tablet TAKE 1 TABLET AT BEDTIME  . ferrous sulfate 325 (65 FE) MG tablet Take 1 tablet (325 mg total) by mouth daily with breakfast.  . hydrochlorothiazide (HYDRODIURIL) 25 MG tablet TAKE 1 TABLET  DAILY  . metoprolol succinate (TOPROL XL) 100 MG 24 hr tablet Take 1 tablet (100 mg total) by mouth daily.  Marland Kitchen omeprazole (PRILOSEC) 40 MG capsule TAKE 1 CAPSULE  DAILY  . potassium chloride (K-DUR,KLOR-CON) 10 MEQ tablet Take 1 tablet (10 mEq total) by mouth 2 (two) times daily.  . ramipril (ALTACE) 10 MG capsule TAKE 1 CAPSULE  DAILY  . ZETIA 10 MG tablet TAKE 1 TABLET AT BEDTIME  . zolpidem (AMBIEN) 10 MG tablet Take 1 tablet (10 mg total) by mouth at bedtime as needed.    Review of Systems  Constitutional: Negative.   HENT: Negative.   Eyes: Negative.   Respiratory: Negative.   Cardiovascular: Negative.   Gastrointestinal: Negative.     Endocrine: Negative.   Genitourinary: Negative.   Musculoskeletal: Negative.   Skin: Negative.   Allergic/Immunologic: Negative.   Neurological: Negative.   Hematological: Negative.   Psychiatric/Behavioral: Negative.        Objective:   Physical Exam  Constitutional: He is oriented to person, place, and time. He appears well-developed and well-nourished. No distress.  HENT:  Head: Normocephalic and atraumatic.  Nose: Nose normal.  Mouth/Throat: Oropharynx is clear and moist. No oropharyngeal exudate.  The patient is wearing bilateral hearing aids with the right amplified to the left. There is debris in both ear canals.  Eyes: Conjunctivae and EOM are normal. Pupils are equal, round, and reactive to light. Right eye exhibits no discharge. Left eye exhibits no discharge. No scleral icterus.  Neck: Normal range of motion. Neck supple. No thyromegaly present.  No adenopathy or thyromegaly  Cardiovascular: Normal rate, regular rhythm, normal heart sounds and intact distal pulses.   No murmur heard. Heart is 72/m with a regular rate and rhythm  Pulmonary/Chest: Effort normal and breath sounds normal. No respiratory distress. He has no wheezes. He has no rales. He exhibits no tenderness.  No axillary adenopathy. Clear anteriorly and posteriorly  Abdominal: Soft. Bowel sounds are normal. He exhibits no mass. There is no tenderness. There is no rebound and no guarding.  Nontender without masses or organ enlargement or inguinal adenopathy  Musculoskeletal: Normal range of motion.  He exhibits no edema.  Lymphadenopathy:    He has no cervical adenopathy.  Neurological: He is alert and oriented to person, place, and time. He has normal reflexes. No cranial nerve deficit.  Skin: Skin is warm and dry. No rash noted.  Psychiatric: He has a normal mood and affect. His behavior is normal. Judgment and thought content normal.  Nursing note and vitals reviewed.  BP 167/90 mmHg  Pulse 79   Temp(Src) 98.4 F (36.9 C) (Oral)  Ht 5' 10.5" (1.791 m)  Wt 185 lb (83.915 kg)  BMI 26.16 kg/m2  [place] was repeated in the right arm sitting with a large cuff manually and it was 136/92.      Assessment & Plan:  1. HYPERTENSION, BENIGN -The patient will continue to watch his sodium intake and take his current medication. We will ask him to get some home blood pressure readings and bring those to [place] for review in a couple of weeks. - POCT CBC - BMP8+EGFR - Hepatic function panel  2. Vitamin D deficiency -Continue current vitamin D intake depending upon lab work. - POCT CBC - Vit D  25 hydroxy (rtn osteoporosis monitoring)  3. Gastroesophageal reflux disease, esophagitis presence not specified -The patient is having no problems with his reflux disease at this time. - POCT CBC  4. Hyperlipemia -The patient should continue with his current cholesterol treatment and therapeutic lifestyle changes which include diet and exercise - POCT CBC - NMR, lipoprofile  5. Anemia, iron deficiency -He will hold the iron preparation until the results of the CBC come back and also to see if it's playing a role with his loose bowel movements. - POCT CBC  6. Cholesteatoma, right -Continue follow-up with your nose and throat, Dr. Redmond Baseman  7. Chest fullness -We will arrange for him to have an appointment with cardiology for further follow-up  8. History of melanoma excision -Because of some new skin lesions on his hand which most likely appear to be warts we will arrange for follow-up with his dermatologist because of the past history of melanoma   Meds ordered this encounter  Medications  . hydrochlorothiazide (HYDRODIURIL) 25 MG tablet    Sig: Take 1 tablet (25 mg total) by mouth daily.    Dispense:  90 tablet    Refill:  3  . metoprolol succinate (TOPROL XL) 100 MG 24 hr tablet    Sig: Take 1 tablet (100 mg total) by mouth daily.    Dispense:  90 tablet    Refill:  3  . omeprazole  (PRILOSEC) 40 MG capsule    Sig: TAKE 1 CAPSULE  DAILY    Dispense:  90 capsule    Refill:  3  . potassium chloride (K-DUR,KLOR-CON) 10 MEQ tablet    Sig: Take 1 tablet (10 mEq total) by mouth daily.    Dispense:  90 tablet    Refill:  3   Patient Instructions  The patient should continue with his current medication Continue to drink plenty of fluids and exercise regularly We will give him referrals for the dermatologist the cardiologist and the ear nose and throat specialist We will call him with the lab work results once these results become available Hold the iron for the next few days until the blood work is back and see if the stools become more firm    Arrie Senate MD

## 2014-06-09 NOTE — Patient Instructions (Signed)
The patient should continue with his current medication Continue to drink plenty of fluids and exercise regularly We will give him referrals for the dermatologist the cardiologist and the ear nose and throat specialist We will call him with the lab work results once these results become available Hold the iron for the next few days until the blood work is back and see if the stools become more firm

## 2014-06-09 NOTE — Addendum Note (Signed)
Addended by: Zannie Cove on: 06/09/2014 04:47 PM   Modules accepted: Orders

## 2014-06-10 LAB — NMR, LIPOPROFILE
Cholesterol: 139 mg/dL (ref 100–199)
HDL CHOLESTEROL BY NMR: 39 mg/dL — AB (ref >39)
HDL PARTICLE NUMBER: 32.8 umol/L (ref >=30.5)
LDL Particle Number: 974 nmol/L (ref <1000)
LDL Size: 20.5 nm (ref >20.5)
LDL-C: 72 mg/dL (ref 0–99)
LP-IR SCORE: 74 — AB (ref <=45)
Small LDL Particle Number: 408 nmol/L (ref <=527)
Triglycerides by NMR: 140 mg/dL (ref 0–149)

## 2014-06-10 LAB — VITAMIN D 25 HYDROXY (VIT D DEFICIENCY, FRACTURES): Vit D, 25-Hydroxy: 28.5 ng/mL — ABNORMAL LOW (ref 30.0–100.0)

## 2014-06-10 LAB — BMP8+EGFR
BUN/Creatinine Ratio: 18 (ref 10–22)
BUN: 15 mg/dL (ref 8–27)
CALCIUM: 10.2 mg/dL (ref 8.6–10.2)
CO2: 22 mmol/L (ref 18–29)
Chloride: 97 mmol/L (ref 97–108)
Creatinine, Ser: 0.85 mg/dL (ref 0.76–1.27)
GFR calc Af Amer: 103 mL/min/{1.73_m2} (ref >59)
GFR calc non Af Amer: 89 mL/min/{1.73_m2} (ref >59)
GLUCOSE: 95 mg/dL (ref 65–99)
Potassium: 3.8 mmol/L (ref 3.5–5.2)
Sodium: 139 mmol/L (ref 134–144)

## 2014-06-10 LAB — HEPATIC FUNCTION PANEL
ALT: 19 IU/L (ref 0–44)
AST: 17 IU/L (ref 0–40)
Albumin: 5 g/dL — ABNORMAL HIGH (ref 3.6–4.8)
Alkaline Phosphatase: 69 IU/L (ref 39–117)
BILIRUBIN TOTAL: 1.5 mg/dL — AB (ref 0.0–1.2)
BILIRUBIN, DIRECT: 0.28 mg/dL (ref 0.00–0.40)
TOTAL PROTEIN: 7.1 g/dL (ref 6.0–8.5)

## 2014-06-10 MED ORDER — VITAMIN D (ERGOCALCIFEROL) 1.25 MG (50000 UNIT) PO CAPS: 50000.0000 [IU] | ORAL_CAPSULE | ORAL | Status: DC

## 2014-06-10 NOTE — Addendum Note (Signed)
Addended by: Thana Ates on: 06/10/2014 02:43 PM   Modules accepted: Orders

## 2014-07-02 DIAGNOSIS — H908 Mixed conductive and sensorineural hearing loss, unspecified: Secondary | ICD-10-CM | POA: Diagnosis not present

## 2014-07-02 DIAGNOSIS — Z974 Presence of external hearing-aid: Secondary | ICD-10-CM | POA: Diagnosis not present

## 2014-07-07 DIAGNOSIS — D225 Melanocytic nevi of trunk: Secondary | ICD-10-CM | POA: Diagnosis not present

## 2014-07-07 DIAGNOSIS — L570 Actinic keratosis: Secondary | ICD-10-CM | POA: Diagnosis not present

## 2014-07-07 DIAGNOSIS — Z08 Encounter for follow-up examination after completed treatment for malignant neoplasm: Secondary | ICD-10-CM | POA: Diagnosis not present

## 2014-07-07 DIAGNOSIS — Z85820 Personal history of malignant melanoma of skin: Secondary | ICD-10-CM | POA: Diagnosis not present

## 2014-07-07 DIAGNOSIS — L814 Other melanin hyperpigmentation: Secondary | ICD-10-CM | POA: Diagnosis not present

## 2014-07-09 ENCOUNTER — Ambulatory Visit (INDEPENDENT_AMBULATORY_CARE_PROVIDER_SITE_OTHER): Payer: Commercial Managed Care - HMO | Admitting: Cardiology

## 2014-07-09 ENCOUNTER — Encounter: Payer: Self-pay | Admitting: Cardiology

## 2014-07-09 VITALS — BP 118/80 | HR 68 | Ht 71.0 in | Wt 188.0 lb

## 2014-07-09 DIAGNOSIS — I2510 Atherosclerotic heart disease of native coronary artery without angina pectoris: Secondary | ICD-10-CM

## 2014-07-09 DIAGNOSIS — I10 Essential (primary) hypertension: Secondary | ICD-10-CM | POA: Diagnosis not present

## 2014-07-09 NOTE — Patient Instructions (Signed)
Your physician recommends that you continue on your current medications as directed. Please refer to the Current Medication list given to you today.  Follow up in 18 months with Dr. Percival Spanish in [place].  You will receive a letter in the mail 2 months before you are due.  Please call us when you receive this letter to schedule your follow up appointment.  Thank you for choosing [facility]!!

## 2014-07-09 NOTE — Progress Notes (Signed)
HPI The patient has a distant history of CAD with a PCI in the late 90s by Dr. Olevia Perches.  He has not had further problems.  I was able to see that the last nuclear stress test was in 2010. I also sent him for a POET (Plain Old Exercise Treadmill) in 2014 which he passed without any evidence of ischemia.  It's been about a year and a half since I saw him.  He has had some increased dyspnea with activities but he ascribes this to allergies says he's been having more problems with this. He actually walks 3 miles 5 times a week. With this he denies any cardiovascular symptoms such as chest pressure or arm discomfort. He doesn't describe any resting shortness of breath and has no PND or orthopnea. He has no palpitations, presyncope or syncope. He has no weight gain or edema.  Allergies  Allergen Reactions  . Ampicillin Nausea Only  . Erythromycin     indigestion    Current Outpatient Prescriptions  Medication Sig Dispense Refill  . aspirin (LONGS ADULT LOW STRENGTH ASA) 81 MG EC tablet Take 81 mg by mouth daily.      . cholecalciferol (VITAMIN D) 1000 UNITS tablet Take 1,000 Units by mouth daily.      . CRESTOR 20 MG tablet TAKE 1 TABLET AT BEDTIME 90 tablet 1  . hydrochlorothiazide (HYDRODIURIL) 25 MG tablet Take 1 tablet (25 mg total) by mouth daily. 90 tablet 3  . metoprolol succinate (TOPROL XL) 100 MG 24 hr tablet Take 1 tablet (100 mg total) by mouth daily. 90 tablet 3  . omeprazole (PRILOSEC) 40 MG capsule TAKE 1 CAPSULE  DAILY 90 capsule 3  . potassium chloride (K-DUR,KLOR-CON) 10 MEQ tablet Take 1 tablet (10 mEq total) by mouth daily. 90 tablet 3  . ramipril (ALTACE) 10 MG capsule TAKE 1 CAPSULE  DAILY 90 capsule 1  . Vitamin D, Ergocalciferol, (DRISDOL) 50000 UNITS CAPS capsule Take 1 capsule (50,000 Units total) by mouth every 7 (seven) days. 12 capsule 1  . ZETIA 10 MG tablet TAKE 1 TABLET AT BEDTIME 90 tablet 1  . zolpidem (AMBIEN) 10 MG tablet Take 1 tablet (10 mg total) by mouth  at bedtime as needed. (Patient not taking: Reported on 07/09/2014) 90 tablet 0   No current facility-administered medications for this visit.    Past Medical History  Diagnosis Date  . Hiatal hernia   . Diverticulosis   . Hyperlipidemia   . AF (atrial fibrillation)   . Asthma     stable  . Decreased hearing   . Nephrolithiasis   . CAD (coronary artery disease)   . ED (erectile dysfunction)   . History of ETT 12/1993  . Decreased radial pulse   . Cholesteatoma   . S/P angioplasty   . GERD (gastroesophageal reflux disease)   . Fundic gland polyps of stomach, benign   . Schatzki's ring   . MRSA (methicillin resistant staph aureus) culture positive 07/2009  . Diverticulitis of colon     recurrent w/abscess - resected 2011  . Hypertension   . Vitamin D deficiency   . Melanoma 2013    Right Cheek    Past Surgical History  Procedure Laterality Date  . Colonoscopy  2005  . Esophagogastroduodenoscopy  2005  . Tonsillectomy and adenoidectomy    . Coronary angioplasty with stent placement  2000  . Melanoma excision  2012  . Colon surgery  2011    diverticulitis left resection-Toth  .  Inner ear surgery Bilateral 2015    x6  cholesteatomas    ROS:  Positive for decreased hearing, reflux.  Otherwise as stated in the HPI and negative for all other systems.  PHYSICAL EXAM BP 118/80 mmHg  Pulse 68  Ht 5\' 11"  (1.803 m)  Wt 188 lb (85.276 kg)  BMI 26.23 kg/m2 GENERAL:  Well appearing NECK:  No jugular venous distention, waveform within normal limits, carotid upstroke brisk and symmetric, no bruits, no thyromegaly LUNGS:  Clear to auscultation bilaterally CHEST:  Unremarkable HEART:  PMI not displaced or sustained,S1 and S2 within normal limits, no S3, no S4, no clicks, no rubs, no murmurs ABD:  Flat, positive bowel sounds normal in frequency in pitch, no bruits, no rebound, no guarding, no midline pulsatile mass, no hepatomegaly, no splenomegaly EXT:  2 plus pulses throughout,  no edema, no cyanosis no clubbing   EKG:  Sinus rhythm, rate 68, axis within normal limits, intervals within normal limits, no acute ST-T wave changes.  RSR' V1.  07/09/2014  ASSESSMENT AND PLAN  CAD:  I don't think that his symptoms necessarily represents new obstructive coronary disease. He had a negative treadmill test last year. Given this no further cardiovascular testing is suggested. He will however Hilbert Corrigan note the symptoms worsen at which point he might need stress perfusion testing.  HTN:  The blood pressure is at target. No change in medications is indicated. We will continue with therapeutic lifestyle changes (TLC).

## 2014-07-18 ENCOUNTER — Encounter: Payer: Self-pay | Admitting: Internal Medicine

## 2014-08-07 ENCOUNTER — Other Ambulatory Visit: Payer: Self-pay | Admitting: Family Medicine

## 2014-10-23 ENCOUNTER — Encounter: Payer: Self-pay | Admitting: Family Medicine

## 2014-10-23 ENCOUNTER — Ambulatory Visit (INDEPENDENT_AMBULATORY_CARE_PROVIDER_SITE_OTHER): Payer: Commercial Managed Care - HMO | Admitting: Family Medicine

## 2014-10-23 VITALS — BP 133/86 | HR 75 | Temp 97.1°F | Ht 71.0 in | Wt 191.0 lb

## 2014-10-23 DIAGNOSIS — E785 Hyperlipidemia, unspecified: Secondary | ICD-10-CM

## 2014-10-23 DIAGNOSIS — R51 Headache: Secondary | ICD-10-CM | POA: Diagnosis not present

## 2014-10-23 DIAGNOSIS — I10 Essential (primary) hypertension: Secondary | ICD-10-CM

## 2014-10-23 DIAGNOSIS — K219 Gastro-esophageal reflux disease without esophagitis: Secondary | ICD-10-CM

## 2014-10-23 DIAGNOSIS — H7192 Unspecified cholesteatoma, left ear: Secondary | ICD-10-CM | POA: Diagnosis not present

## 2014-10-23 DIAGNOSIS — R519 Headache, unspecified: Secondary | ICD-10-CM

## 2014-10-23 DIAGNOSIS — E559 Vitamin D deficiency, unspecified: Secondary | ICD-10-CM

## 2014-10-23 DIAGNOSIS — H9190 Unspecified hearing loss, unspecified ear: Secondary | ICD-10-CM | POA: Diagnosis not present

## 2014-10-23 DIAGNOSIS — Z139 Encounter for screening, unspecified: Secondary | ICD-10-CM

## 2014-10-23 DIAGNOSIS — D509 Iron deficiency anemia, unspecified: Secondary | ICD-10-CM

## 2014-10-23 MED ORDER — RAMIPRIL 10 MG PO CAPS: 10.0000 mg | ORAL_CAPSULE | Freq: Every day | ORAL | Status: DC

## 2014-10-23 MED ORDER — ZOLPIDEM TARTRATE 10 MG PO TABS: 10.0000 mg | ORAL_TABLET | Freq: Every evening | ORAL | Status: DC | PRN

## 2014-10-23 MED ORDER — ROSUVASTATIN CALCIUM 20 MG PO TABS: 20.0000 mg | ORAL_TABLET | Freq: Every day | ORAL | Status: DC

## 2014-10-23 MED ORDER — EZETIMIBE 10 MG PO TABS: 10.0000 mg | ORAL_TABLET | Freq: Every day | ORAL | Status: DC

## 2014-10-23 MED ORDER — POTASSIUM CHLORIDE CRYS ER 10 MEQ PO TBCR: 10.0000 meq | EXTENDED_RELEASE_TABLET | Freq: Every day | ORAL | Status: DC

## 2014-10-23 MED ORDER — METOPROLOL SUCCINATE ER 100 MG PO TB24: 100.0000 mg | ORAL_TABLET | Freq: Every day | ORAL | Status: DC

## 2014-10-23 MED ORDER — OMEPRAZOLE 40 MG PO CPDR: DELAYED_RELEASE_CAPSULE | ORAL | Status: DC

## 2014-10-23 MED ORDER — HYDROCHLOROTHIAZIDE 25 MG PO TABS: 25.0000 mg | ORAL_TABLET | Freq: Every day | ORAL | Status: DC

## 2014-10-23 NOTE — Addendum Note (Signed)
Addended by: Zannie Cove on: 10/23/2014 09:14 AM   Modules accepted: Orders

## 2014-10-23 NOTE — Progress Notes (Signed)
Subjective:    Patient ID: Maurice Weaver, male    DOB: 08/31/1945, 69 y.o.   MRN: [id number]  HPI Pt here for follow up and management of chronic medical problems which includes hypertension and hyperlipidemia. He is taking medications regularly. The patient's main complaint today is headache. He is requesting refills on all of his medicines. He will get lab work done today. The headache the patient is having is behind the left ear. He sees the ear nose and throat specialist regularly for history of cholesteatoma. He wears bilateral hearing aids. He denies chest pain shortness of breath trouble swallowing and heartburn indigestion nausea vomiting diarrhea or blood in the stool. He stopped the iron medication he had been taking over-the-counter and loose stools that he was having went away. He stopped the iron about 1 month ago. We will make sure that we get a CBC today. Because of the increased headache issues and the history of cholesteatoma he will be seeing the audiologist that works with his ear nose and throat specialist in 2-3 weeks and she will refer him back to Dr. Redmond Baseman if they feel the cholesteatoma has recurred.      Patient Active Problem List   Diagnosis Date Noted  . GERD (gastroesophageal reflux disease) 01/23/2013  . Insomnia 01/23/2013  . Anemia, iron deficiency 01/23/2013  . Melanoma in situ of cheek, history of 06/13/2012  . Hearing deficit 06/13/2012  . Hyperlipidemia 12/24/2008  . HYPERTENSION, BENIGN 12/24/2008  . Coronary atherosclerosis of native coronary artery 12/24/2008   Outpatient Encounter Prescriptions as of 10/23/2014  Medication Sig  . aspirin (LONGS ADULT LOW STRENGTH ASA) 81 MG EC tablet Take 81 mg by mouth daily.    . cholecalciferol (VITAMIN D) 1000 UNITS tablet Take 1,000 Units by mouth daily.    . CRESTOR 20 MG tablet TAKE 1 TABLET AT BEDTIME  . hydrochlorothiazide (HYDRODIURIL) 25 MG tablet Take 1 tablet (25 mg total) by mouth daily.  .  metoprolol succinate (TOPROL XL) 100 MG 24 hr tablet Take 1 tablet (100 mg total) by mouth daily.  Marland Kitchen omeprazole (PRILOSEC) 40 MG capsule TAKE 1 CAPSULE  DAILY  . potassium chloride (K-DUR,KLOR-CON) 10 MEQ tablet Take 1 tablet (10 mEq total) by mouth daily.  . ramipril (ALTACE) 10 MG capsule TAKE 1 CAPSULE EVERY DAY  . ZETIA 10 MG tablet TAKE 1 TABLET AT BEDTIME  . zolpidem (AMBIEN) 10 MG tablet Take 1 tablet (10 mg total) by mouth at bedtime as needed.  . [DISCONTINUED] Vitamin D, Ergocalciferol, (DRISDOL) 50000 UNITS CAPS capsule Take 1 capsule (50,000 Units total) by mouth every 7 (seven) days.   No facility-administered encounter medications on file as of 10/23/2014.      Review of Systems  Constitutional: Negative.   Eyes: Negative.   Respiratory: Negative.   Cardiovascular: Negative.   Gastrointestinal: Negative.   Endocrine: Negative.   Genitourinary: Negative.   Musculoskeletal: Negative.   Skin: Negative.   Allergic/Immunologic: Negative.   Neurological: Positive for headaches.  Hematological: Negative.   Psychiatric/Behavioral: Negative.        Objective:   Physical Exam  Constitutional: He is oriented to person, place, and time. He appears well-developed and well-nourished. No distress.  HENT:  Head: Normocephalic and atraumatic.  Right Ear: External ear normal.  Mouth/Throat: Oropharynx is clear and moist. No oropharyngeal exudate.  The left TM has a cauliflower like appearance to it. Nasal congestion bilaterally  Eyes: Conjunctivae and EOM are normal. Pupils are equal, round,  and reactive to light. Right eye exhibits no discharge. Left eye exhibits no discharge. No scleral icterus.  Neck: Normal range of motion. Neck supple. No thyromegaly present.  No bruits thyromegaly or anterior cervical adenopathy  Cardiovascular: Normal rate, regular rhythm, normal heart sounds and intact distal pulses.   No murmur heard. Heart is regular at 72/m  Pulmonary/Chest: Effort  normal and breath sounds normal. No respiratory distress. He has no wheezes. He has no rales. He exhibits no tenderness.  Clear anteriorly and posteriorly without axillary adenopathy  Abdominal: Soft. Bowel sounds are normal. He exhibits no mass. There is no tenderness. There is no rebound and no guarding.  Nontender without masses or organ enlargement or bruits  Musculoskeletal: Normal range of motion. He exhibits no edema or tenderness.  Lymphadenopathy:    He has no cervical adenopathy.  Neurological: He is alert and oriented to person, place, and time. He has normal reflexes. No cranial nerve deficit.  Skin: Skin is warm and dry. No rash noted.  Psychiatric: He has a normal mood and affect. His behavior is normal. Judgment and thought content normal.  Nursing note and vitals reviewed.  BP 133/86 mmHg  Pulse 75  Temp(Src) 97.1 F (36.2 C) (Oral)  Ht '5\' 11"'  (1.803 m)  Wt 191 lb (86.637 kg)  BMI 26.65 kg/m2        Assessment & Plan:  1. HYPERTENSION, BENIGN -The blood pressures under good control today the patient will continue with current treatment - BMP8+EGFR - Hepatic function panel - CBC with Differential/Platelet  2. Vitamin D deficiency -He will continue with current vitamin D replacement pending results of lab work - CBC with Differential/Platelet - Vit D  25 hydroxy (rtn osteoporosis monitoring)  3. Hyperlipemia -Continue with the Zetia and the Crestor pending results of lab work. In addition he will continue with as aggressive therapeutic lifestyle changes as possible. - CBC with Differential/Platelet - NMR, lipoprofile  4. Gastroesophageal reflux disease, esophagitis presence not specified -The patient's GERD is under good control he has no complaints with reflux or heartburn. He'll continue to take the omeprazole. - Hepatic function panel - CBC with Differential/Platelet  5. Anemia, iron deficiency -The patient stop his over-the-counter iron preparation due  to some loose stools that were occurring secondary to this medication. This was stopped 4 weeks ago. We will not start this again until the lab work is returned. - CBC with Differential/Platelet  6. HOH (hard of hearing), unspecified laterality -Continue with current hearing aids and follow-up with audiologist - Ambulatory referral to Audiology  7. Screening -The patient has a history of melanoma in situ and will need a referral back to the dermatologist for this. - Ambulatory referral to Dermatology  8. Occipital headache -This headache is been behind the left ear. The patient will make an appointment to follow-up with his ear nose and throat specialist.  9. Cholesteatoma, left -Follow up with ENT  Meds ordered this encounter  Medications  . ezetimibe (ZETIA) 10 MG tablet    Sig: Take 1 tablet (10 mg total) by mouth at bedtime.    Dispense:  90 tablet    Refill:  3  . ramipril (ALTACE) 10 MG capsule    Sig: Take 1 capsule (10 mg total) by mouth daily.    Dispense:  90 capsule    Refill:  3  . rosuvastatin (CRESTOR) 20 MG tablet    Sig: Take 1 tablet (20 mg total) by mouth at bedtime.  Dispense:  90 tablet    Refill:  3  . hydrochlorothiazide (HYDRODIURIL) 25 MG tablet    Sig: Take 1 tablet (25 mg total) by mouth daily.    Dispense:  90 tablet    Refill:  3  . metoprolol succinate (TOPROL XL) 100 MG 24 hr tablet    Sig: Take 1 tablet (100 mg total) by mouth daily.    Dispense:  90 tablet    Refill:  3  . omeprazole (PRILOSEC) 40 MG capsule    Sig: TAKE 1 CAPSULE  DAILY    Dispense:  90 capsule    Refill:  3  . potassium chloride (K-DUR,KLOR-CON) 10 MEQ tablet    Sig: Take 1 tablet (10 mEq total) by mouth daily.    Dispense:  90 tablet    Refill:  3  . zolpidem (AMBIEN) 10 MG tablet    Sig: Take 1 tablet (10 mg total) by mouth at bedtime as needed.    Dispense:  90 tablet    Refill:  1   Patient Instructions                       Medicare Annual Wellness  Visit  [facility] and the medical providers at [facility] strive to bring you the best medical care.  In doing so we not only want to address your current medical conditions and concerns but also to detect new conditions early and prevent illness, disease and health-related problems.    Medicare offers a yearly Wellness Visit which allows our clinical staff to assess your need for preventative services including immunizations, lifestyle education, counseling to decrease risk of preventable diseases and screening for fall risk and other medical concerns.    This visit is provided free of charge (no copay) for all Medicare recipients. The clinical pharmacists at [facility] have begun to conduct these Wellness Visits which will also include a thorough review of all your medications.    As you primary medical provider recommend that you make an appointment for your Annual Wellness Visit if you have not done so already this year.  You may set up this appointment before you leave today or you may call back [phone]) and schedule an appointment.  Please make sure when you call that you mention that you are scheduling your Annual Wellness Visit with the clinical pharmacist so that the appointment may be made for the proper length of time.     Continue current medications. Continue good therapeutic lifestyle changes which include good diet and exercise. Fall precautions discussed with patient. If an FOBT was given today- please return it to our front desk. If you are over 33 years old - you may need Prevnar 35 or the adult Pneumonia vaccine.  **Flu shots will be available soon--- please call and schedule a FLU-CLINIC appointment**  After your visit with [place] today you will receive a survey in the mail or online from [employer] regarding your care with [place]. Please take a moment to fill this out. Your feedback is very important to [place] as you can help [place] better  understand your patient needs as well as improve your experience and satisfaction. WE CARE ABOUT YOU!!!   **Please join [place] SEPT.[email or web address], 2016 from 5:00 to 7:00pm for our OPEN HOUSE! Come out and meet our NEW providers** Follow-up with ear nose and throat as planned Continue to exercise regularly and drink plenty of water We will call  you with your lab work results as soon as they become available Take Tylenol for headache if needed Use nasal saline which is over-the-counter 3 or 4 times daily for nasal congestion Flonase one spray each nostril at bedtime will also help allergic rhinitis   Arrie Senate MD

## 2014-10-23 NOTE — Patient Instructions (Addendum)
Medicare Annual Wellness Visit  [facility] and the medical providers at [facility] strive to bring you the best medical care.  In doing so we not only want to address your current medical conditions and concerns but also to detect new conditions early and prevent illness, disease and health-related problems.    Medicare offers a yearly Wellness Visit which allows our clinical staff to assess your need for preventative services including immunizations, lifestyle education, counseling to decrease risk of preventable diseases and screening for fall risk and other medical concerns.    This visit is provided free of charge (no copay) for all Medicare recipients. The clinical pharmacists at [facility] have begun to conduct these Wellness Visits which will also include a thorough review of all your medications.    As you primary medical provider recommend that you make an appointment for your Annual Wellness Visit if you have not done so already this year.  You may set up this appointment before you leave today or you may call back ([phone]) and schedule an appointment.  Please make sure when you call that you mention that you are scheduling your Annual Wellness Visit with the clinical pharmacist so that the appointment may be made for the proper length of time.     Continue current medications. Continue good therapeutic lifestyle changes which include good diet and exercise. Fall precautions discussed with patient. If an FOBT was given today- please return it to our front desk. If you are over 70 years old - you may need Prevnar 88 or the adult Pneumonia vaccine.  **Flu shots will be available soon--- please call and schedule a FLU-CLINIC appointment**  After your visit with [place] today you will receive a survey in the mail or online from [employer] regarding your care with [place]. Please take a moment to fill this out. Your feedback is  very important to [place] as you can help [place] better understand your patient needs as well as improve your experience and satisfaction. WE CARE ABOUT YOU!!!   **Please join [place] SEPT.[email or web address], 2016 from 5:00 to 7:00pm for our OPEN HOUSE! Come out and meet our NEW providers** Follow-up with ear nose and throat as planned Continue to exercise regularly and drink plenty of water We will call you with your lab work results as soon as they become available Take Tylenol for headache if needed Use nasal saline which is over-the-counter 3 or 4 times daily for nasal congestion Flonase one spray each nostril at bedtime will also help allergic rhinitis

## 2014-10-24 LAB — CBC WITH DIFFERENTIAL/PLATELET
BASOS ABS: 0.1 10*3/uL (ref 0.0–0.2)
BASOS: 1 %
EOS (ABSOLUTE): 0.1 10*3/uL (ref 0.0–0.4)
Eos: 2 %
Hematocrit: 48.7 % (ref 37.5–51.0)
Hemoglobin: 16.6 g/dL (ref 12.6–17.7)
IMMATURE GRANS (ABS): 0 10*3/uL (ref 0.0–0.1)
IMMATURE GRANULOCYTES: 0 %
LYMPHS: 35 %
Lymphocytes Absolute: 2.3 10*3/uL (ref 0.7–3.1)
MCH: 31.7 pg (ref 26.6–33.0)
MCHC: 34.1 g/dL (ref 31.5–35.7)
MCV: 93 fL (ref 79–97)
MONOS ABS: 0.5 10*3/uL (ref 0.1–0.9)
Monocytes: 7 %
NEUTROS ABS: 3.6 10*3/uL (ref 1.4–7.0)
NEUTROS PCT: 55 %
PLATELETS: 219 10*3/uL (ref 150–379)
RBC: 5.23 x10E6/uL (ref 4.14–5.80)
RDW: 13.4 % (ref 12.3–15.4)
WBC: 6.5 10*3/uL (ref 3.4–10.8)

## 2014-10-24 LAB — NMR, LIPOPROFILE
Cholesterol: 122 mg/dL (ref 100–199)
HDL Cholesterol by NMR: 34 mg/dL — ABNORMAL LOW (ref >39)
HDL Particle Number: 30.3 umol/L — ABNORMAL LOW (ref >=30.5)
LDL PARTICLE NUMBER: 983 nmol/L (ref <1000)
LDL SIZE: 19.9 nm (ref >20.5)
LDL-C: 50 mg/dL (ref 0–99)
LP-IR SCORE: 89 — AB (ref <=45)
Small LDL Particle Number: 713 nmol/L — ABNORMAL HIGH (ref <=527)
Triglycerides by NMR: 189 mg/dL — ABNORMAL HIGH (ref 0–149)

## 2014-10-24 LAB — BMP8+EGFR
BUN / CREAT RATIO: 16 (ref 10–22)
BUN: 16 mg/dL (ref 8–27)
CO2: 26 mmol/L (ref 18–29)
CREATININE: 1 mg/dL (ref 0.76–1.27)
Calcium: 10.1 mg/dL (ref 8.6–10.2)
Chloride: 98 mmol/L (ref 97–108)
GFR, EST AFRICAN AMERICAN: 88 mL/min/{1.73_m2} (ref >59)
GFR, EST NON AFRICAN AMERICAN: 76 mL/min/{1.73_m2} (ref >59)
Glucose: 100 mg/dL — ABNORMAL HIGH (ref 65–99)
Potassium: 3.8 mmol/L (ref 3.5–5.2)
SODIUM: 141 mmol/L (ref 134–144)

## 2014-10-24 LAB — HEPATIC FUNCTION PANEL
ALK PHOS: 73 IU/L (ref 39–117)
ALT: 26 IU/L (ref 0–44)
AST: 19 IU/L (ref 0–40)
Albumin: 4.6 g/dL (ref 3.6–4.8)
BILIRUBIN, DIRECT: 0.27 mg/dL (ref 0.00–0.40)
Bilirubin Total: 1.1 mg/dL (ref 0.0–1.2)
Total Protein: 6.6 g/dL (ref 6.0–8.5)

## 2014-10-24 LAB — VITAMIN D 25 HYDROXY (VIT D DEFICIENCY, FRACTURES): VIT D 25 HYDROXY: 34.2 ng/mL (ref 30.0–100.0)

## 2014-10-28 ENCOUNTER — Telehealth: Payer: Self-pay | Admitting: Family Medicine

## 2014-11-04 DIAGNOSIS — H7120 Cholesteatoma of mastoid, unspecified ear: Secondary | ICD-10-CM | POA: Diagnosis not present

## 2014-11-04 DIAGNOSIS — H908 Mixed conductive and sensorineural hearing loss, unspecified: Secondary | ICD-10-CM | POA: Diagnosis not present

## 2014-11-04 DIAGNOSIS — H6980 Other specified disorders of Eustachian tube, unspecified ear: Secondary | ICD-10-CM | POA: Diagnosis not present

## 2014-11-30 IMAGING — CR DG FOOT COMPLETE 3+V*L*
2 series · 2 of 2 positions shown · non-contrast
Comparison: None.

CLINICAL DATA: Heel pain.  No trauma.

EXAM:
LEFT FOOT - COMPLETE 3+ VIEW

[view not recorded (1 of 2)]
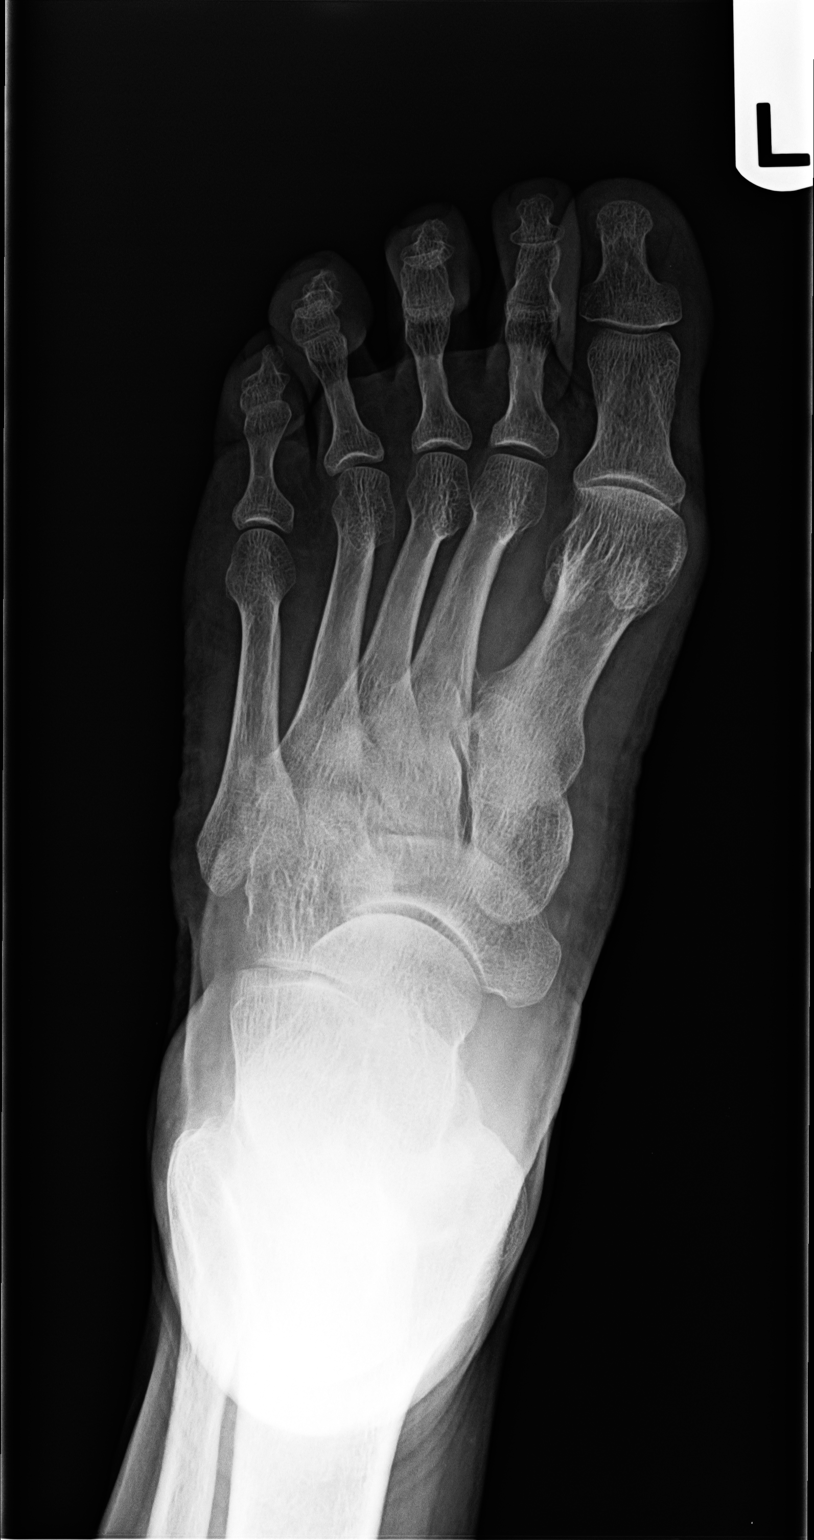

[view not recorded (2 of 2)]
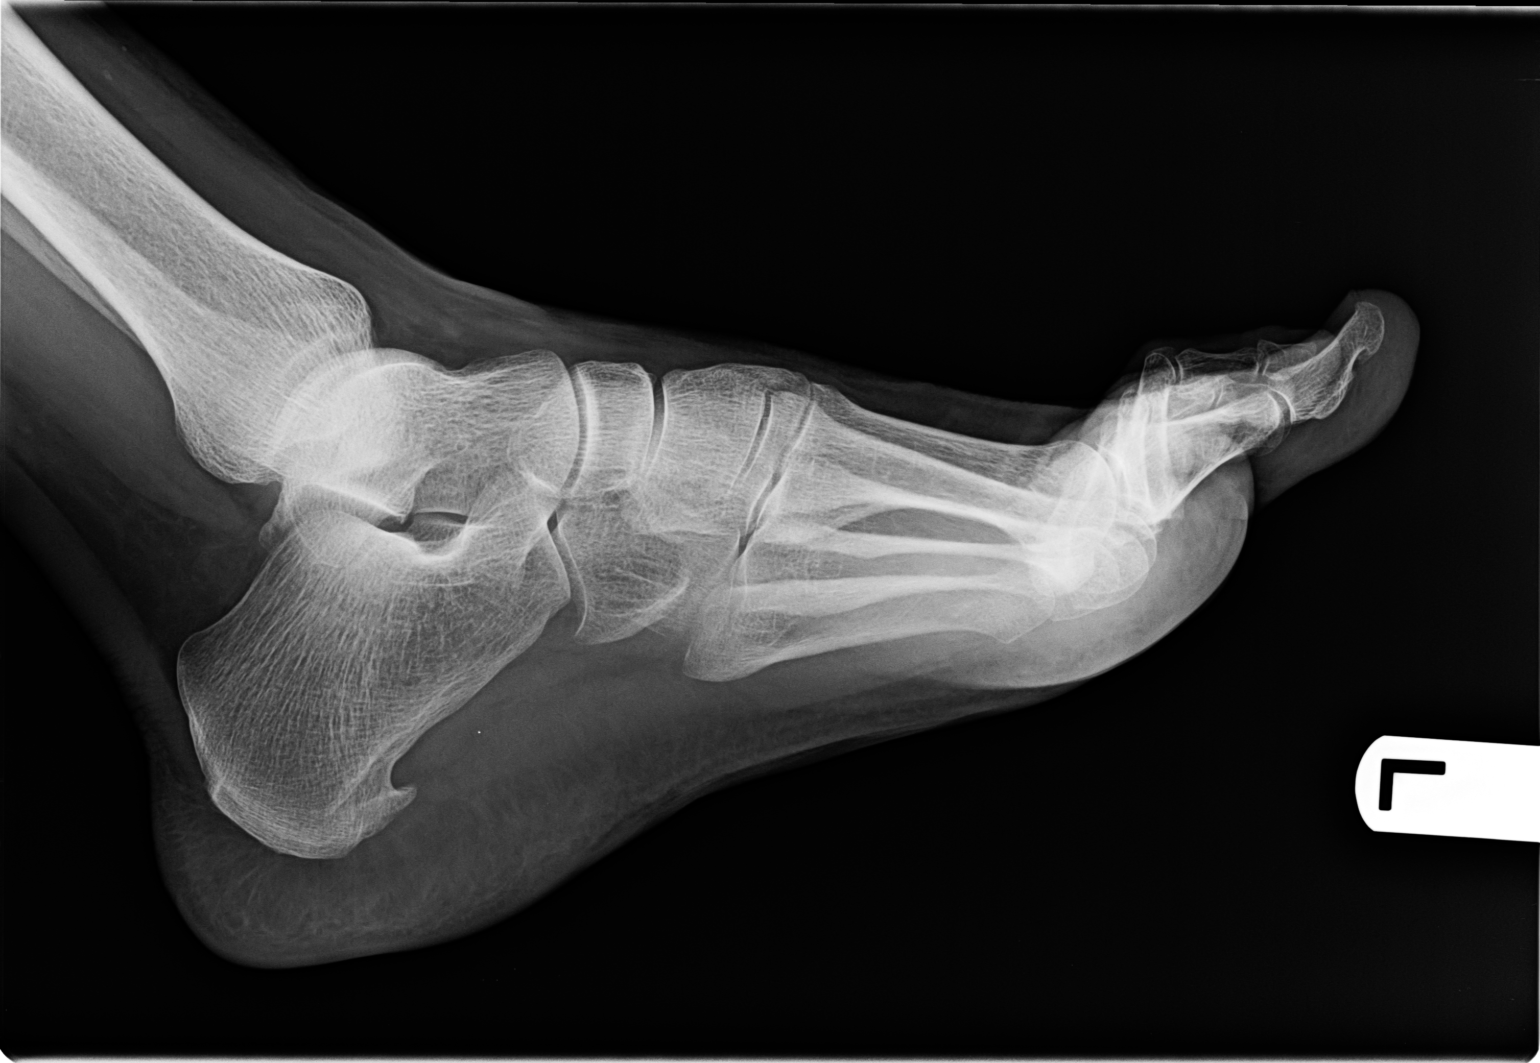

[2 of 2 positions shown; findings below may reference images not displayed]

FINDINGS: No fracture or dislocation.  No significant arthropathic changes.

There is a small plantar calcaneal spur. Soft tissues are
unremarkable.
IMPRESSION: Plantar calcaneal spur.  No fracture or acute finding.

## 2014-12-04 DIAGNOSIS — Z974 Presence of external hearing-aid: Secondary | ICD-10-CM | POA: Diagnosis not present

## 2014-12-04 DIAGNOSIS — H906 Mixed conductive and sensorineural hearing loss, bilateral: Secondary | ICD-10-CM | POA: Diagnosis not present

## 2014-12-04 DIAGNOSIS — Z9622 Myringotomy tube(s) status: Secondary | ICD-10-CM | POA: Diagnosis not present

## 2014-12-04 DIAGNOSIS — H9212 Otorrhea, left ear: Secondary | ICD-10-CM | POA: Diagnosis not present

## 2014-12-18 DIAGNOSIS — H908 Mixed conductive and sensorineural hearing loss, unspecified: Secondary | ICD-10-CM | POA: Diagnosis not present

## 2014-12-18 DIAGNOSIS — H6980 Other specified disorders of Eustachian tube, unspecified ear: Secondary | ICD-10-CM | POA: Diagnosis not present

## 2014-12-18 DIAGNOSIS — H7120 Cholesteatoma of mastoid, unspecified ear: Secondary | ICD-10-CM | POA: Diagnosis not present

## 2014-12-29 ENCOUNTER — Ambulatory Visit (INDEPENDENT_AMBULATORY_CARE_PROVIDER_SITE_OTHER): Payer: Commercial Managed Care - HMO

## 2014-12-29 DIAGNOSIS — Z23 Encounter for immunization: Secondary | ICD-10-CM

## 2015-03-18 ENCOUNTER — Ambulatory Visit (INDEPENDENT_AMBULATORY_CARE_PROVIDER_SITE_OTHER): Payer: Commercial Managed Care - HMO | Admitting: Family Medicine

## 2015-03-18 ENCOUNTER — Encounter: Payer: Self-pay | Admitting: Family Medicine

## 2015-03-18 VITALS — BP 136/90 | HR 77 | Temp 97.4°F | Ht 71.0 in | Wt 186.0 lb

## 2015-03-18 DIAGNOSIS — N400 Enlarged prostate without lower urinary tract symptoms: Secondary | ICD-10-CM | POA: Diagnosis not present

## 2015-03-18 DIAGNOSIS — K219 Gastro-esophageal reflux disease without esophagitis: Secondary | ICD-10-CM | POA: Diagnosis not present

## 2015-03-18 DIAGNOSIS — H9193 Unspecified hearing loss, bilateral: Secondary | ICD-10-CM | POA: Diagnosis not present

## 2015-03-18 DIAGNOSIS — D509 Iron deficiency anemia, unspecified: Secondary | ICD-10-CM

## 2015-03-18 DIAGNOSIS — H7192 Unspecified cholesteatoma, left ear: Secondary | ICD-10-CM

## 2015-03-18 DIAGNOSIS — E559 Vitamin D deficiency, unspecified: Secondary | ICD-10-CM | POA: Diagnosis not present

## 2015-03-18 DIAGNOSIS — E785 Hyperlipidemia, unspecified: Secondary | ICD-10-CM

## 2015-03-18 DIAGNOSIS — I10 Essential (primary) hypertension: Secondary | ICD-10-CM | POA: Diagnosis not present

## 2015-03-18 LAB — POCT UA - MICROSCOPIC ONLY
BACTERIA, U MICROSCOPIC: NEGATIVE
CRYSTALS, UR, HPF, POC: NEGATIVE
Casts, Ur, LPF, POC: NEGATIVE
RBC, URINE, MICROSCOPIC: NEGATIVE
Yeast, UA: NEGATIVE

## 2015-03-18 LAB — POCT URINALYSIS DIPSTICK
Bilirubin, UA: NEGATIVE
Blood, UA: NEGATIVE
GLUCOSE UA: NEGATIVE
KETONES UA: NEGATIVE
Nitrite, UA: NEGATIVE
Protein, UA: NEGATIVE
SPEC GRAV UA: 1.015
Urobilinogen, UA: NEGATIVE
pH, UA: 5

## 2015-03-18 MED ORDER — EZETIMIBE 10 MG PO TABS: 10.0000 mg | ORAL_TABLET | Freq: Every day | ORAL | Status: DC

## 2015-03-18 NOTE — Patient Instructions (Addendum)
Medicare Annual Wellness Visit  [facility] and the medical providers at [facility] strive to bring you the best medical care.  In doing so we not only want to address your current medical conditions and concerns but also to detect new conditions early and prevent illness, disease and health-related problems.    Medicare offers a yearly Wellness Visit which allows our clinical staff to assess your need for preventative services including immunizations, lifestyle education, counseling to decrease risk of preventable diseases and screening for fall risk and other medical concerns.    This visit is provided free of charge (no copay) for all Medicare recipients. The clinical pharmacists at [facility] have begun to conduct these Wellness Visits which will also include a thorough review of all your medications.    As you primary medical provider recommend that you make an appointment for your Annual Wellness Visit if you have not done so already this year.  You may set up this appointment before you leave today or you may call back [phone] and schedule an appointment.  Please make sure when you call that you mention that you are scheduling your Annual Wellness Visit with the clinical pharmacist so that the appointment may be made for the proper length of time.     Continue current medications. Continue good therapeutic lifestyle changes which include good diet and exercise. Fall precautions discussed with patient. If an FOBT was given today- please return it to our front desk. If you are over 64 years old - you may need Prevnar 32 or the adult Pneumonia vaccine.  **Flu shots are available--- please call and schedule a FLU-CLINIC appointment**  After your visit with [place] today you will receive a survey in the mail or online from [employer] regarding your care with [place]. Please take a moment to fill this out. Your feedback is very  important to [place] as you can help [place] better understand your patient needs as well as improve your experience and satisfaction. WE CARE ABOUT YOU!!!   Continue to stay active physically and this winter drink plenty of fluids and stay well hydrated and use a cool mist humidifier and keep the house as cool as possible Continue to follow up with the ear nose and throat specialist Return the FOBT

## 2015-03-18 NOTE — Progress Notes (Signed)
Subjective:    Patient ID: Maurice Weaver, male    DOB: [birth year], 70 y.o.   MRN: [id number]  HPI Pt here for follow up and management of chronic medical problems which includes hypertension, hyperlipidemia and anemia. He is taking medications regularly. The patient is doing well overall with minimal complaints. He still sees his ear nose and throat specialist, Dr. Redmond Baseman about his cholesteatoma and he is following him closely on this. 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 The patient is exercising regularly but not as much this winter with the bad weather. He denies chest pain shortness of breath trouble swallowing heartburn indigestion nausea vomiting or blood in the stool or black tarry bowel movements. He does have loose bowel movements which have been going on for years and his last colonoscopy was in 2013. The gastroenterologist was aware of that at the time. He is passing his water without problems. He has seen the cardiologist on several occasions in the past and he is always told to return if needed and we will just recommend that he goes to see him every 45 years because of problems from the past and currently cardiac-wise he is stable.     Patient Active Problem List   Diagnosis Date Noted  . GERD (gastroesophageal reflux disease) 01/23/2013  . Insomnia 01/23/2013  . Anemia, iron deficiency 01/23/2013  . Melanoma in situ of cheek, history of 06/13/2012  . Hearing deficit 06/13/2012  . Hyperlipidemia 12/24/2008  . HYPERTENSION, BENIGN 12/24/2008  . Coronary atherosclerosis of native coronary artery 12/24/2008   Outpatient Encounter Prescriptions as of 03/18/2015  Medication Sig  . aspirin (LONGS ADULT LOW STRENGTH ASA) 81 MG EC tablet Take 81 mg by mouth daily.    . cholecalciferol (VITAMIN D) 1000 UNITS tablet Take 1,000 Units by mouth daily.    Marland Kitchen ezetimibe (ZETIA) 10 MG tablet Take 1 tablet (10 mg total) by mouth at bedtime.  . hydrochlorothiazide (HYDRODIURIL) 25 MG tablet  Take 1 tablet (25 mg total) by mouth daily.  . metoprolol succinate (TOPROL XL) 100 MG 24 hr tablet Take 1 tablet (100 mg total) by mouth daily.  Marland Kitchen omeprazole (PRILOSEC) 40 MG capsule TAKE 1 CAPSULE  DAILY  . potassium chloride (K-DUR,KLOR-CON) 10 MEQ tablet Take 1 tablet (10 mEq total) by mouth daily.  . ramipril (ALTACE) 10 MG capsule Take 1 capsule (10 mg total) by mouth daily.  . rosuvastatin (CRESTOR) 20 MG tablet Take 1 tablet (20 mg total) by mouth at bedtime.  Marland Kitchen zolpidem (AMBIEN) 10 MG tablet Take 1 tablet (10 mg total) by mouth at bedtime as needed.   No facility-administered encounter medications on file as of 03/18/2015.      Review of Systems  Constitutional: Negative.   HENT: Negative.   Eyes: Negative.   Respiratory: Negative.   Cardiovascular: Negative.   Gastrointestinal: Negative.   Endocrine: Negative.   Genitourinary: Negative.   Musculoskeletal: Negative.   Skin: Negative.   Allergic/Immunologic: Negative.   Neurological: Negative.   Hematological: Negative.   Psychiatric/Behavioral: Negative.        Objective:   Physical Exam  Constitutional: He is oriented to person, place, and time. He appears well-developed and well-nourished. No distress.  HENT:  Head: Normocephalic and atraumatic.  Right Ear: External ear normal.  Left Ear: External ear normal.  Nose: Nose normal.  Mouth/Throat: Oropharynx is clear and moist. No oropharyngeal exudate.  Left hearing aid and transducer from the right referring to the left  Eyes: Conjunctivae and EOM are normal. Pupils are equal, round, and reactive to light. Right eye exhibits no discharge. Left eye exhibits no discharge. No scleral icterus.  The patient is up-to-date on his eye exams  Neck: Normal range of motion. Neck supple. No tracheal deviation present. No thyromegaly present.  No carotid bruits thyromegaly or anterior cervical adenopathy  Cardiovascular: Normal rate, regular rhythm, normal heart sounds and  intact distal pulses.   No murmur heard. Heart is regular at 72/m  Pulmonary/Chest: Effort normal and breath sounds normal. No respiratory distress. He has no wheezes. He has no rales. He exhibits no tenderness.  Axillary is clear chest wall is normal and lungs are clear anteriorly and posteriorly  Abdominal: Soft. Bowel sounds are normal. He exhibits no mass. There is no tenderness. There is no rebound and no guarding.  No abdominal tenderness masses or organ enlargement  Genitourinary: Rectum normal and penis normal.  The prostate is smooth but enlarged. There are no lumps or masses. There were no rectal masses. The external genitalia were within normal limits and no inguinal hernias or inguinal adenopathy were palpable.  Musculoskeletal: Normal range of motion. He exhibits no edema.  Lymphadenopathy:    He has no cervical adenopathy.  Neurological: He is alert and oriented to person, place, and time. He has normal reflexes. No cranial nerve deficit.  Skin: Skin is warm and dry. No rash noted.  Psychiatric: He has a normal mood and affect. His behavior is normal. Judgment and thought content normal.  Nursing note and vitals reviewed.  BP 136/90 mmHg  Pulse 77  Temp(Src) 97.4 F (36.3 C) (Oral)  Ht '5\' 11"'  (1.803 m)  Wt 186 lb (84.369 kg)  BMI 25.95 kg/m2        Assessment & Plan:  1. HYPERTENSION, BENIGN -Continue current treatment pending results of lab work and watch sodium intake - BMP8+EGFR - CBC with Differential/Platelet - Hepatic function panel  2. Vitamin D deficiency -Continue current treatment pending results of lab work - CBC with Differential/Platelet - VITAMIN D 25 Hydroxy (Vit-D Deficiency, Fractures)  3. Gastroesophageal reflux disease, esophagitis presence not specified -Continue with omeprazole and if patient decides to change or switch over to ranitidine, he was instructed on how to do this. - CBC with Differential/Platelet - Hepatic function panel  4.  Hyperlipemia -Continue with exercise regimen diet and current treatment pending results of lab work - CBC with Differential/Platelet - NMR, lipoprofile  5. Anemia, iron deficiency -The patient is having no signs of blood loss and we will check the hemoglobin today with a blood count. - CBC with Differential/Platelet  6. BPH (benign prostatic hyperplasia) -He is having no symptoms with his BPH and he will await his PSA reading. - POCT UA - Microscopic Only - POCT urinalysis dipstick - CBC with Differential/Platelet - PSA, total and free  7. Hearing deficit, bilateral -Continue to follow-up with ENT  8. Cholesteatoma, left -Continue to follow up with ear nose and throat  Meds ordered this encounter  Medications  . ezetimibe (ZETIA) 10 MG tablet    Sig: Take 1 tablet (10 mg total) by mouth at bedtime.    Dispense:  90 tablet    Refill:  3   Patient Instructions                       Medicare Annual Wellness Visit  [facility] and the medical providers at [facility] strive to bring you  the best medical care.  In doing so we not only want to address your current medical conditions and concerns but also to detect new conditions early and prevent illness, disease and health-related problems.    Medicare offers a yearly Wellness Visit which allows our clinical staff to assess your need for preventative services including immunizations, lifestyle education, counseling to decrease risk of preventable diseases and screening for fall risk and other medical concerns.    This visit is provided free of charge (no copay) for all Medicare recipients. The clinical pharmacists at [facility] have begun to conduct these Wellness Visits which will also include a thorough review of all your medications.    As you primary medical provider recommend that you make an appointment for your Annual Wellness Visit if you have not done so already this year.  You  may set up this appointment before you leave today or you may call back ([phone]) and schedule an appointment.  Please make sure when you call that you mention that you are scheduling your Annual Wellness Visit with the clinical pharmacist so that the appointment may be made for the proper length of time.     Continue current medications. Continue good therapeutic lifestyle changes which include good diet and exercise. Fall precautions discussed with patient. If an FOBT was given today- please return it to our front desk. If you are over 64 years old - you may need Prevnar 2 or the adult Pneumonia vaccine.  **Flu shots are available--- please call and schedule a FLU-CLINIC appointment**  After your visit with [place] today you will receive a survey in the mail or online from [employer] regarding your care with [place]. Please take a moment to fill this out. Your feedback is very important to [place] as you can help [place] better understand your patient needs as well as improve your experience and satisfaction. WE CARE ABOUT YOU!!!   Continue to stay active physically and this winter drink plenty of fluids and stay well hydrated and use a cool mist humidifier and keep the house as cool as possible Continue to follow up with the ear nose and throat specialist Return the FOBT    Arrie Senate MD

## 2015-03-19 LAB — CBC WITH DIFFERENTIAL/PLATELET
BASOS: 0 %
Basophils Absolute: 0 10*3/uL (ref 0.0–0.2)
EOS (ABSOLUTE): 0.1 10*3/uL (ref 0.0–0.4)
EOS: 1 %
HEMATOCRIT: 50.1 % (ref 37.5–51.0)
Hemoglobin: 17.3 g/dL (ref 12.6–17.7)
IMMATURE GRANS (ABS): 0 10*3/uL (ref 0.0–0.1)
IMMATURE GRANULOCYTES: 0 %
LYMPHS: 37 %
Lymphocytes Absolute: 2.8 10*3/uL (ref 0.7–3.1)
MCH: 32.6 pg (ref 26.6–33.0)
MCHC: 34.5 g/dL (ref 31.5–35.7)
MCV: 94 fL (ref 79–97)
MONOS ABS: 0.5 10*3/uL (ref 0.1–0.9)
Monocytes: 7 %
NEUTROS PCT: 55 %
Neutrophils Absolute: 4.1 10*3/uL (ref 1.4–7.0)
Platelets: 256 10*3/uL (ref 150–379)
RBC: 5.31 x10E6/uL (ref 4.14–5.80)
RDW: 13 % (ref 12.3–15.4)
WBC: 7.6 10*3/uL (ref 3.4–10.8)

## 2015-03-19 LAB — BMP8+EGFR
BUN / CREAT RATIO: 13 (ref 10–22)
BUN: 13 mg/dL (ref 8–27)
CO2: 26 mmol/L (ref 18–29)
CREATININE: 1.01 mg/dL (ref 0.76–1.27)
Calcium: 10.6 mg/dL — ABNORMAL HIGH (ref 8.6–10.2)
Chloride: 95 mmol/L — ABNORMAL LOW (ref 96–106)
GFR calc Af Amer: 87 mL/min/{1.73_m2} (ref >59)
GFR, EST NON AFRICAN AMERICAN: 75 mL/min/{1.73_m2} (ref >59)
Glucose: 93 mg/dL (ref 65–99)
Potassium: 3.8 mmol/L (ref 3.5–5.2)
SODIUM: 137 mmol/L (ref 134–144)

## 2015-03-19 LAB — NMR, LIPOPROFILE
Cholesterol: 141 mg/dL (ref 100–199)
HDL Cholesterol by NMR: 38 mg/dL — ABNORMAL LOW (ref >39)
HDL Particle Number: 31.1 umol/L (ref >=30.5)
LDL Particle Number: 1160 nmol/L — ABNORMAL HIGH (ref <1000)
LDL SIZE: 20.2 nm (ref >20.5)
LDL-C: 60 mg/dL (ref 0–99)
LP-IR Score: 76 — ABNORMAL HIGH (ref <=45)
SMALL LDL PARTICLE NUMBER: 660 nmol/L — AB (ref <=527)
Triglycerides by NMR: 217 mg/dL — ABNORMAL HIGH (ref 0–149)

## 2015-03-19 LAB — PSA, TOTAL AND FREE
PROSTATE SPECIFIC AG, SERUM: 2.2 ng/mL (ref 0.0–4.0)
PSA FREE: 0.61 ng/mL
PSA, Free Pct: 27.7 %

## 2015-03-19 LAB — HEPATIC FUNCTION PANEL
ALK PHOS: 70 IU/L (ref 39–117)
ALT: 18 IU/L (ref 0–44)
AST: 14 IU/L (ref 0–40)
Albumin: 4.9 g/dL — ABNORMAL HIGH (ref 3.5–4.8)
BILIRUBIN TOTAL: 1.2 mg/dL (ref 0.0–1.2)
BILIRUBIN, DIRECT: 0.28 mg/dL (ref 0.00–0.40)
Total Protein: 6.9 g/dL (ref 6.0–8.5)

## 2015-03-19 LAB — VITAMIN D 25 HYDROXY (VIT D DEFICIENCY, FRACTURES): Vit D, 25-Hydroxy: 38 ng/mL (ref 30.0–100.0)

## 2015-03-27 ENCOUNTER — Other Ambulatory Visit: Payer: Commercial Managed Care - HMO

## 2015-03-27 DIAGNOSIS — Z1212 Encounter for screening for malignant neoplasm of rectum: Secondary | ICD-10-CM | POA: Diagnosis not present

## 2015-03-28 LAB — FECAL OCCULT BLOOD, IMMUNOCHEMICAL: Fecal Occult Bld: POSITIVE — AB

## 2015-03-30 ENCOUNTER — Other Ambulatory Visit: Payer: Self-pay | Admitting: Nurse Practitioner

## 2015-03-30 DIAGNOSIS — K921 Melena: Secondary | ICD-10-CM

## 2015-03-31 ENCOUNTER — Encounter: Payer: Self-pay | Admitting: Internal Medicine

## 2015-05-27 ENCOUNTER — Ambulatory Visit (INDEPENDENT_AMBULATORY_CARE_PROVIDER_SITE_OTHER): Payer: Commercial Managed Care - HMO | Admitting: Internal Medicine

## 2015-05-27 ENCOUNTER — Other Ambulatory Visit (INDEPENDENT_AMBULATORY_CARE_PROVIDER_SITE_OTHER): Payer: Commercial Managed Care - HMO

## 2015-05-27 ENCOUNTER — Encounter: Payer: Self-pay | Admitting: Internal Medicine

## 2015-05-27 VITALS — BP 120/82 | HR 72 | Ht 71.0 in | Wt 185.8 lb

## 2015-05-27 DIAGNOSIS — R195 Other fecal abnormalities: Secondary | ICD-10-CM

## 2015-05-27 DIAGNOSIS — R10812 Left upper quadrant abdominal tenderness: Secondary | ICD-10-CM | POA: Diagnosis not present

## 2015-05-27 DIAGNOSIS — R1902 Left upper quadrant abdominal swelling, mass and lump: Secondary | ICD-10-CM

## 2015-05-27 DIAGNOSIS — R197 Diarrhea, unspecified: Secondary | ICD-10-CM | POA: Diagnosis not present

## 2015-05-27 LAB — BUN: BUN: 13 mg/dL (ref 6–23)

## 2015-05-27 LAB — CREATININE, SERUM: Creatinine, Ser: 0.95 mg/dL (ref 0.40–1.50)

## 2015-05-27 NOTE — Progress Notes (Signed)
Subjective:    Patient ID: Maurice Weaver, male    DOB: 1946-02-25, 70 y.o.   MRN: [id number] Cc: heme + diarrhea/cghange in bowels HPI The patient is here because of diarrhea problems. Mostly postprandial can occur even with a meal, loose watery stools. Very urgent though I don't think he's had incontinence. As part of an evaluation by primary care in January he had a heme positive stool though he was not anemic. I evaluated him with an EGD and a colonoscopy in 2013 and these were unrevealing. Just a small polyp was the most significant finding though he does have internal hemorrhoids as well. He has intentionally lost some weight over the years. He is physically active walking several miles a day with his dog when the weather is good. He does not have any pain in the abdomen. No nausea or vomiting. There is some occasional indigestion. He notes that his mother had diarrhea as she got older she did have pancreatic cancer. He denies having any signs of steatorrhea. Note he denies any melena hematochezia or rectal bleeding.  Wt Readings from Last 3 Encounters:  05/27/15 185 lb 12.8 oz (84.278 kg)  03/18/15 186 lb (84.369 kg)  10/23/14 191 lb (86.637 kg)  Medications, allergies, past medical history, past surgical history, family history and social history are reviewed and updated in the EMR.   Review of Systems As per history of present illness. He has decreased hearing. He is using some Tylenol he has some frequent headaches. Not more than 2 g a day. All other review of systems are negative.    Objective:   Physical Exam @BP  120/82 mmHg  Pulse 72  Ht 5\' 11"  (1.803 m)  Wt 185 lb 12.8 oz (84.278 kg)  BMI 25.93 kg/m2@  General:  Well-developed, well-nourished and in no acute distress Eyes:  anicteric. ENT:   Mouth and posterior pharynx free of lesions.  Neck:   supple w/o thyromegaly or mass.  Lungs: Clear to auscultation bilaterally. Heart:  S1S2, no rubs, murmurs,  gallops. Abdomen:  soft, with firm small mass effect LUQ that is tender, no hepatosplenomegaly, hernia, and BS+.  Lymph:  no cervical or supraclavicular adenopathy. Extremities:   no e cyanosis or clubbing Skin   no rash. Neuro:  A&O x 3.  Psych:  appropriate mood and  Affect.   Data Reviewed: As per history of present illness. He had a CT scan of the abdomen and pelvis in 2011 which showed diverticulitis. I looked at his primary care notes and the labs in the computer.    Assessment & Plan:   1. Diarrhea, unspecified type   2. Heme + stool   3. LUQ abdominal tenderness   4. Abdominal mass, LUQ (left upper quadrant)    I'm not outer percent sure what going on here. It could be an irritable bowel syndrome. Celiac disease is possible as well. The heme positive stool could be from hemorrhoids. Inflammatory bowel disease could be starting. However with this possible mass in the left upper quadrant and the tenderness and the family history of pancreatic cancer I'm than to start the workup with a CT of the abdomen pelvis with contrast. Kidney function will be checked prior to that. Pending that exam depending upon what that shows, celiac serology testing and colonoscopy seem like the next steps. He understands and agrees with the plan and we reviewed risks benefits and indications of endoscopy procedures.  I appreciate the opportunity to care for this patient.  CC: Redge Gainer, MD

## 2015-05-27 NOTE — Patient Instructions (Signed)
Your physician has requested that you go to the basement for lab work before leaving today.   You have been scheduled for a CT scan of the abdomen and pelvis at [facility] ([address]---this is in the same building as [facility]).   You are scheduled on 05/29/15 at 9:30AM. You should arrive 15 minutes prior to your appointment time for registration. Please follow the written instructions below on the day of your exam:  WARNING: IF YOU ARE ALLERGIC TO IODINE/X-RAY DYE, PLEASE NOTIFY RADIOLOGY IMMEDIATELY AT [phone]! YOU WILL BE GIVEN A 13 HOUR PREMEDICATION PREP.  1) Do not eat or drink anything after 6:30AM(4 hours prior to your test) 2) You have been given 2 bottles of oral contrast to drink. The solution may taste  better if refrigerated, but do NOT add ice or any other liquid to this solution. Shake  well before drinking.    Drink 1 bottle of contrast @ 8:30AM (2 hours prior to your exam)  Drink 1 bottle of contrast @ 9:30AM (1 hour prior to your exam)  You may take any medications as prescribed with a small amount of water except for the following: Metformin, Glucophage, Glucovance, Avandamet, Riomet, Fortamet, Actoplus Met, Janumet, Glumetza or Metaglip. The above medications must be held the day of the exam AND 48 hours after the exam.  The purpose of you drinking the oral contrast is to aid in the visualization of your intestinal tract. The contrast solution may cause some diarrhea. Before your exam is started, you will be given a small amount of fluid to drink. Depending on your individual set of symptoms, you may also receive an intravenous injection of x-ray contrast/dye. Plan on being at [facility] for 30 minutes or longer, depending on the type of exam you are having performed.  This test typically takes 30-45 minutes to complete.  If you have any questions regarding your exam or if you need to reschedule, you may call the CT department at  [phone] between the hours of 8:00 am and 5:00 pm, Monday-Friday.  ________________________________________________________________________  I appreciate the opportunity to care for you.

## 2015-05-29 ENCOUNTER — Ambulatory Visit (INDEPENDENT_AMBULATORY_CARE_PROVIDER_SITE_OTHER)
Admission: RE | Admit: 2015-05-29 | Discharge: 2015-05-29 | Disposition: A | Discharge status: Home / Self Care | Payer: Commercial Managed Care - HMO | Source: Ambulatory Visit | Attending: Internal Medicine | Admitting: Internal Medicine

## 2015-05-29 DIAGNOSIS — R197 Diarrhea, unspecified: Secondary | ICD-10-CM

## 2015-05-29 DIAGNOSIS — R1902 Left upper quadrant abdominal swelling, mass and lump: Secondary | ICD-10-CM

## 2015-05-29 DIAGNOSIS — R10812 Left upper quadrant abdominal tenderness: Secondary | ICD-10-CM

## 2015-05-29 DIAGNOSIS — N200 Calculus of kidney: Secondary | ICD-10-CM | POA: Diagnosis not present

## 2015-05-29 DIAGNOSIS — N281 Cyst of kidney, acquired: Secondary | ICD-10-CM | POA: Diagnosis not present

## 2015-05-29 MED ORDER — IOPAMIDOL (ISOVUE-300) INJECTION 61%
100.0000 mL | Freq: Once | INTRAVENOUS | Status: AC | PRN
  Administered 2015-05-29: 100 mL via INTRAVENOUS

## 2015-06-01 ENCOUNTER — Other Ambulatory Visit: Payer: Self-pay

## 2015-06-01 DIAGNOSIS — R197 Diarrhea, unspecified: Secondary | ICD-10-CM

## 2015-06-01 NOTE — Progress Notes (Signed)
Quick Note:  His kidney cysts have grown and that is what I felt on abdominal exam. I do not think this a problem.  He has diarrhea so needs TTG Ab and IgA level. Needs colonoscopy scheduled also.   ______

## 2015-06-04 DIAGNOSIS — H903 Sensorineural hearing loss, bilateral: Secondary | ICD-10-CM | POA: Diagnosis not present

## 2015-06-23 ENCOUNTER — Other Ambulatory Visit (INDEPENDENT_AMBULATORY_CARE_PROVIDER_SITE_OTHER): Payer: Commercial Managed Care - HMO

## 2015-06-23 ENCOUNTER — Ambulatory Visit (AMBULATORY_SURGERY_CENTER): Payer: Self-pay

## 2015-06-23 VITALS — Ht 71.0 in | Wt 185.6 lb

## 2015-06-23 DIAGNOSIS — R197 Diarrhea, unspecified: Secondary | ICD-10-CM

## 2015-06-23 LAB — IGA: IGA: 207 mg/dL (ref 68–378)

## 2015-06-23 NOTE — Progress Notes (Signed)
No allergies to eggs or soy No past problems with anesthesia No home oxygen No diet meds  Has email and internet; refused emmi 

## 2015-06-24 LAB — TISSUE TRANSGLUTAMINASE, IGA: Tissue Transglutaminase Ab, IgA: 1 U/mL (ref <4)

## 2015-06-25 NOTE — Progress Notes (Signed)
Quick Note:  No celiac dz'will see him at colonoscopy 5/4 ______

## 2015-07-02 ENCOUNTER — Ambulatory Visit (AMBULATORY_SURGERY_CENTER): Payer: Commercial Managed Care - HMO | Admitting: Internal Medicine

## 2015-07-02 ENCOUNTER — Encounter: Payer: Self-pay | Admitting: Internal Medicine

## 2015-07-02 VITALS — BP 112/73 | HR 76 | Temp 99.1°F | Resp 13 | Ht 71.0 in | Wt 185.0 lb

## 2015-07-02 DIAGNOSIS — K219 Gastro-esophageal reflux disease without esophagitis: Secondary | ICD-10-CM | POA: Diagnosis not present

## 2015-07-02 DIAGNOSIS — I10 Essential (primary) hypertension: Secondary | ICD-10-CM | POA: Diagnosis not present

## 2015-07-02 DIAGNOSIS — R197 Diarrhea, unspecified: Secondary | ICD-10-CM

## 2015-07-02 DIAGNOSIS — I2510 Atherosclerotic heart disease of native coronary artery without angina pectoris: Secondary | ICD-10-CM | POA: Diagnosis not present

## 2015-07-02 DIAGNOSIS — I4891 Unspecified atrial fibrillation: Secondary | ICD-10-CM | POA: Diagnosis not present

## 2015-07-02 MED ORDER — SODIUM CHLORIDE 0.9 % IV SOLN: 500.0000 mL | INTRAVENOUS | Status: DC

## 2015-07-02 NOTE — Patient Instructions (Addendum)
I did not see anything bad here - no cancer, no colitis. i did take some biopsies to see if there is a microscopic inflammation - will call next week.  If nothing there we will get a CT scan to check on the pancreas.  I appreciate the opportunity to care for you. Gatha Mayer, MD, FACG  YOU HAD AN ENDOSCOPIC PROCEDURE TODAY AT [facility]:   Refer to the procedure report that was given to you for any specific questions about what was found during the examination.  If the procedure report does not answer your questions, please call your gastroenterologist to clarify.  If you requested that your care partner not be given the details of your procedure findings, then the procedure report has been included in a sealed envelope for you to review at your convenience later.  YOU SHOULD EXPECT: Some feelings of bloating in the abdomen. Passage of more gas than usual.  Walking can help get rid of the air that was put into your GI tract during the procedure and reduce the bloating. If you had a lower endoscopy (such as a colonoscopy or flexible sigmoidoscopy) you may notice spotting of blood in your stool or on the toilet paper. If you underwent a bowel prep for your procedure, you may not have a normal bowel movement for a few days.  Please Note:  You might notice some irritation and congestion in your nose or some drainage.  This is from the oxygen used during your procedure.  There is no need for concern and it should clear up in a day or so.  SYMPTOMS TO REPORT IMMEDIATELY:   Following lower endoscopy (colonoscopy or flexible sigmoidoscopy):  Excessive amounts of blood in the stool  Significant tenderness or worsening of abdominal pains  Swelling of the abdomen that is new, acute  Fever of 100F or higher  For urgent or emergent issues, a gastroenterologist can be reached at any hour by calling [phone].   DIET: Your first meal following the procedure should be a  small meal and then it is ok to progress to your normal diet. Heavy or fried foods are harder to digest and may make you feel nauseous or bloated.  Likewise, meals heavy in dairy and vegetables can increase bloating.  Drink plenty of fluids but you should avoid alcoholic beverages for 24 hours.  ACTIVITY:  You should plan to take it easy for the rest of today and you should NOT DRIVE or use heavy machinery until tomorrow (because of the sedation medicines used during the test).    FOLLOW UP: Our staff will call the number listed on your records the next business day following your procedure to check on you and address any questions or concerns that you may have regarding the information given to you following your procedure. If we do not reach you, we will leave a message.  However, if you are feeling well and you are not experiencing any problems, there is no need to return our call.  We will assume that you have returned to your regular daily activities without incident.  If any biopsies were taken you will be contacted by phone or by letter within the next 1-3 weeks.  Please call us at [phone] if you have not heard about the biopsies in 3 weeks.   SIGNATURES/CONFIDENTIALITY: You and/or your care partner have signed paperwork which will be entered into your electronic medical record.  These signatures attest to  the fact that that the information above on your After Visit Summary has been reviewed and is understood.  Full responsibility of the confidentiality of this discharge information lies with you and/or your care-partner.  Please read over handout about hemorrhoids  Please continue your normal medications

## 2015-07-02 NOTE — Progress Notes (Signed)
Called to room to assist during endoscopic procedure.  Patient ID and intended procedure confirmed with present staff. Received instructions for my participation in the procedure from the performing physician.  

## 2015-07-02 NOTE — Progress Notes (Signed)
Pt was a poor historian about his history and medications. maw

## 2015-07-02 NOTE — Op Note (Signed)
[facility] Patient Name: Maurice Weaver Procedure Date: 07/02/2015 2:47 PM MRN: [id number] Endoscopist: Gatha Mayer , MD Age: 70 Date of Birth: 1945-03-10 Gender: Male Procedure:                Colonoscopy Indications:              Clinically significant diarrhea of unexplained                            origin Medicines:                Monitored Anesthesia Care Procedure:                Pre-Anesthesia Assessment:                           - Prior to the procedure, a History and Physical                            was performed, and patient medications and                            allergies were reviewed. The patient's tolerance of                            previous anesthesia was also reviewed. The risks                            and benefits of the procedure and the sedation                            options and risks were discussed with the patient.                            All questions were answered, and informed consent                            was obtained. Prior Anticoagulants: The patient has                            taken no previous anticoagulant or antiplatelet                            agents. ASA Grade Assessment: II - A patient with                            mild systemic disease. After reviewing the risks                            and benefits, the patient was deemed in                            satisfactory condition to undergo the procedure.                           After  obtaining informed consent, the colonoscope                            was passed under direct vision. Throughout the                            procedure, the patient's blood pressure, pulse, and                            oxygen saturations were monitored continuously. The                            Model CF-HQ190L [id number]) scope was introduced                            through the anus and advanced to the the terminal                            ileum, with  identification of the appendiceal                            orifice and IC valve. The colonoscopy was performed                            without difficulty. The patient tolerated the                            procedure well. The quality of the bowel                            preparation was good. The bowel preparation used                            was Miralax. The terminal ileum and the rectum were                            photographed. Scope In: 2:58:46 PM Scope Out: 3:05:38 PM Scope Withdrawal Time: 0 hours 5 minutes 48 seconds  Total Procedure Duration: 0 hours 6 minutes 52 seconds  Findings:                 The perianal and digital rectal examinations were                            normal.                           The terminal ileum appeared normal.                           The colon (entire examined portion) appeared                            normal. Biopsies for histology were taken with a  cold forceps from the entire colon for evaluation                            of microscopic colitis. Verification of patient                            identification for the specimen was done. Estimated                            blood loss was minimal.                           Internal hemorrhoids were found during retroflexion. Complications:            No immediate complications. Estimated Blood Loss:     Estimated blood loss was minimal. Impression:               - The examined portion of the ileum was normal.                           - The entire examined colon is normal. Biopsied.                           - Internal hemorrhoids. Recommendation:           - Patient has a contact number available for                            emergencies. The signs and symptoms of potential                            delayed complications were discussed with the                            patient. Return to normal activities tomorrow.                             Written discharge instructions were provided to the                            patient.                           - Await pathology results.                           - Resume previous diet.                           - Continue present medications.                           - No repeat colonoscopy due to age.                           - If biopsies unhelpful given LUQ pain and FHx  pancreatic cancer will check CT Gatha Mayer, MD 07/02/2015 3:14:40 PM This report has been signed electronically. CC Letter to:             Chipper Herb

## 2015-07-02 NOTE — Progress Notes (Signed)
Report to PACU, RN, vss, BBS= Clear.  

## 2015-07-03 ENCOUNTER — Telehealth: Payer: Self-pay | Admitting: *Deleted

## 2015-07-03 NOTE — Telephone Encounter (Signed)
  Follow up Call-  Call back number 07/02/2015  Post procedure Call Back phone  # [phone] hm  Permission to leave phone message Yes     Patient questions:  Do you have a fever, pain , or abdominal swelling? No. Pain Score  0 *  Have you tolerated food without any problems? Yes.    Have you been able to return to your normal activities? Yes.    Do you have any questions about your discharge instructions: Diet   No. Medications  No. Follow up visit  No.  Do you have questions or concerns about your Care? No.  Actions: * If pain score is 4 or above: No action needed, pain <4.

## 2015-07-09 NOTE — Progress Notes (Signed)
Quick Note:  Biopsies ok Cause of diarrhea not found so far but nothing bad Ask him to see if he can use a daily Imodium vs half or scheduled every other day to see if that checks up the diarrhea - could take 2 a day if that is what it takes If what I suggested is not helping call us back  At this point I do not recommend other testing  Change colon recall - cancel existing and no more as would not do routine at 79 No letter from [facility] ______

## 2015-07-24 DIAGNOSIS — H7192 Unspecified cholesteatoma, left ear: Secondary | ICD-10-CM | POA: Diagnosis not present

## 2015-07-24 DIAGNOSIS — Z9622 Myringotomy tube(s) status: Secondary | ICD-10-CM | POA: Diagnosis not present

## 2015-07-24 DIAGNOSIS — H73891 Other specified disorders of tympanic membrane, right ear: Secondary | ICD-10-CM | POA: Diagnosis not present

## 2015-07-24 DIAGNOSIS — H906 Mixed conductive and sensorineural hearing loss, bilateral: Secondary | ICD-10-CM | POA: Insufficient documentation

## 2015-08-01 DIAGNOSIS — J45909 Unspecified asthma, uncomplicated: Secondary | ICD-10-CM | POA: Diagnosis not present

## 2015-08-01 DIAGNOSIS — R410 Disorientation, unspecified: Secondary | ICD-10-CM | POA: Diagnosis not present

## 2015-08-01 DIAGNOSIS — I10 Essential (primary) hypertension: Secondary | ICD-10-CM | POA: Diagnosis not present

## 2015-08-01 DIAGNOSIS — R569 Unspecified convulsions: Secondary | ICD-10-CM | POA: Diagnosis not present

## 2015-08-01 DIAGNOSIS — T426X5A Adverse effect of other antiepileptic and sedative-hypnotic drugs, initial encounter: Secondary | ICD-10-CM | POA: Diagnosis not present

## 2015-08-01 DIAGNOSIS — R079 Chest pain, unspecified: Secondary | ICD-10-CM | POA: Diagnosis not present

## 2015-08-01 DIAGNOSIS — Z79899 Other long term (current) drug therapy: Secondary | ICD-10-CM | POA: Diagnosis not present

## 2015-08-01 DIAGNOSIS — M6281 Muscle weakness (generalized): Secondary | ICD-10-CM | POA: Diagnosis not present

## 2015-08-01 DIAGNOSIS — G459 Transient cerebral ischemic attack, unspecified: Secondary | ICD-10-CM | POA: Diagnosis not present

## 2015-08-01 DIAGNOSIS — G92 Toxic encephalopathy: Secondary | ICD-10-CM | POA: Diagnosis not present

## 2015-08-01 DIAGNOSIS — I2510 Atherosclerotic heart disease of native coronary artery without angina pectoris: Secondary | ICD-10-CM | POA: Diagnosis not present

## 2015-08-01 DIAGNOSIS — I639 Cerebral infarction, unspecified: Secondary | ICD-10-CM | POA: Diagnosis not present

## 2015-08-01 DIAGNOSIS — R4182 Altered mental status, unspecified: Secondary | ICD-10-CM | POA: Diagnosis not present

## 2015-08-02 DIAGNOSIS — Z79899 Other long term (current) drug therapy: Secondary | ICD-10-CM | POA: Diagnosis not present

## 2015-08-02 DIAGNOSIS — R410 Disorientation, unspecified: Secondary | ICD-10-CM | POA: Diagnosis not present

## 2015-08-02 DIAGNOSIS — I2510 Atherosclerotic heart disease of native coronary artery without angina pectoris: Secondary | ICD-10-CM | POA: Diagnosis not present

## 2015-08-02 DIAGNOSIS — I639 Cerebral infarction, unspecified: Secondary | ICD-10-CM | POA: Diagnosis not present

## 2015-08-02 DIAGNOSIS — G9340 Encephalopathy, unspecified: Secondary | ICD-10-CM | POA: Diagnosis not present

## 2015-08-02 DIAGNOSIS — I6523 Occlusion and stenosis of bilateral carotid arteries: Secondary | ICD-10-CM | POA: Diagnosis not present

## 2015-08-02 DIAGNOSIS — T426X5A Adverse effect of other antiepileptic and sedative-hypnotic drugs, initial encounter: Secondary | ICD-10-CM | POA: Diagnosis not present

## 2015-08-02 DIAGNOSIS — J45909 Unspecified asthma, uncomplicated: Secondary | ICD-10-CM | POA: Diagnosis not present

## 2015-08-02 DIAGNOSIS — I10 Essential (primary) hypertension: Secondary | ICD-10-CM | POA: Diagnosis not present

## 2015-08-02 DIAGNOSIS — I5189 Other ill-defined heart diseases: Secondary | ICD-10-CM | POA: Diagnosis not present

## 2015-08-02 DIAGNOSIS — G92 Toxic encephalopathy: Secondary | ICD-10-CM | POA: Diagnosis not present

## 2015-08-02 DIAGNOSIS — R4182 Altered mental status, unspecified: Secondary | ICD-10-CM | POA: Diagnosis not present

## 2015-08-27 DIAGNOSIS — H906 Mixed conductive and sensorineural hearing loss, bilateral: Secondary | ICD-10-CM | POA: Diagnosis not present

## 2015-08-27 DIAGNOSIS — H9212 Otorrhea, left ear: Secondary | ICD-10-CM | POA: Diagnosis not present

## 2015-08-27 DIAGNOSIS — H7192 Unspecified cholesteatoma, left ear: Secondary | ICD-10-CM | POA: Diagnosis not present

## 2015-09-03 ENCOUNTER — Ambulatory Visit (INDEPENDENT_AMBULATORY_CARE_PROVIDER_SITE_OTHER): Payer: Commercial Managed Care - HMO | Admitting: Pharmacist

## 2015-09-03 ENCOUNTER — Encounter: Payer: Self-pay | Admitting: Pharmacist

## 2015-09-03 VITALS — BP 122/80 | HR 84 | Ht 70.0 in | Wt 184.0 lb

## 2015-09-03 DIAGNOSIS — Z0000 Encounter for general adult medical examination without abnormal findings: Secondary | ICD-10-CM

## 2015-09-03 MED ORDER — EZETIMIBE 10 MG PO TABS: 10.0000 mg | ORAL_TABLET | Freq: Every day | ORAL | Status: DC

## 2015-09-03 MED ORDER — ROSUVASTATIN CALCIUM 20 MG PO TABS: 20.0000 mg | ORAL_TABLET | Freq: Every day | ORAL | Status: DC

## 2015-09-03 MED ORDER — ASPIRIN 81 MG PO TBEC: 81.0000 mg | DELAYED_RELEASE_TABLET | Freq: Every day | ORAL | Status: DC

## 2015-09-03 MED ORDER — POTASSIUM CHLORIDE CRYS ER 10 MEQ PO TBCR: 10.0000 meq | EXTENDED_RELEASE_TABLET | Freq: Every day | ORAL | Status: DC

## 2015-09-03 MED ORDER — METOPROLOL SUCCINATE ER 100 MG PO TB24: 100.0000 mg | ORAL_TABLET | Freq: Every day | ORAL | Status: DC

## 2015-09-03 MED ORDER — HYDROCHLOROTHIAZIDE 25 MG PO TABS: 25.0000 mg | ORAL_TABLET | Freq: Every day | ORAL | Status: DC

## 2015-09-03 MED ORDER — OMEPRAZOLE 40 MG PO CPDR: DELAYED_RELEASE_CAPSULE | ORAL | Status: DC

## 2015-09-03 NOTE — Patient Instructions (Addendum)
Maurice Weaver , Thank you for taking time to come for your Medicare Wellness Visit. I appreciate your ongoing commitment to your health goals. Please review the following plan we discussed and let me know if I can assist you in the future.   These are the goals we discussed: Continue to exercise daily  Recommend eye exam  Increase non-starchy vegetables - carrots, green bean, squash, zucchini, tomatoes, onions, peppers, spinach and other green leafy vegetables, cabbage, lettuce, cucumbers, asparagus, okra (not fried), eggplant Limit sugar and processed foods (cakes, cookies, ice cream, crackers and chips) Increase fresh fruit but limit serving sizes 1/2 cup or about the size of tennis or baseball Limit red meat to no more than 1-2 times per week (serving size about the size of your palm) Choose whole grains / lean proteins - whole wheat bread, quinoa, whole grain rice (1/2 cup), fish, chicken, [place] Avoid sugar and calorie containing beverages - soda, sweet tea and juice.  Choose water or unsweetened tea instead.   This is a list of the screening recommended for you and due dates:  Health Maintenance  Topic Date Due  .  Hepatitis C: One time screening is recommended by Center for Disease Control  (CDC) for  adults born from [birth year] through 1966.   1945-05-17 - will get at visit with Dr Laurance Flatten  . Flu Shot  09/29/2015  . Stool Blood Test  03/26/2016  . Tetanus Vaccine  06/29/2018  . Colon Cancer Screening  07/01/2025  . Shingles Vaccine  Completed  . Pneumonia vaccines  Completed    Health Maintenance, Male A healthy lifestyle and preventative care can promote health and wellness.  Maintain regular health, dental, and eye exams.  Eat a healthy diet. Foods like vegetables, fruits, whole grains, low-fat dairy products, and lean protein foods contain the nutrients you need and are low in calories. Decrease your intake of foods high in solid fats, added sugars, and salt. Get information  about a proper diet from your health care provider, if necessary.  Regular physical exercise is one of the most important things you can do for your health. Most adults should get at least 150 minutes of moderate-intensity exercise (any activity that increases your heart rate and causes you to sweat) each week. In addition, most adults need muscle-strengthening exercises on 2 or more days a week.   Maintain a healthy weight. The body mass index (BMI) is a screening tool to identify possible weight problems. It provides an estimate of body fat based on height and weight. Your health care provider can find your BMI and can help you achieve or maintain a healthy weight. For males 20 years and older:  A BMI below 18.5 is considered underweight.  A BMI of 18.5 to 24.9 is normal.  A BMI of 25 to 29.9 is considered overweight.  A BMI of 30 and above is considered obese.  Maintain normal blood lipids and cholesterol by exercising and minimizing your intake of saturated fat. Eat a balanced diet with plenty of fruits and vegetables. Blood tests for lipids and cholesterol should begin at age 79 and be repeated every 5 years. If your lipid or cholesterol levels are high, you are over age 16, or you are at high risk for heart disease, you may need your cholesterol levels checked more frequently.Ongoing high lipid and cholesterol levels should be treated with medicines if diet and exercise are not working.  If you smoke, find out from your health care provider how  to quit. If you do not use tobacco, do not start.  Lung cancer screening is recommended for adults aged 35-80 years who are at high risk for developing lung cancer because of a history of smoking. A yearly low-dose CT scan of the lungs is recommended for people who have at least a 30-pack-year history of smoking and are current smokers or have quit within the past 15 years. A pack year of smoking is smoking an average of 1 pack of cigarettes a day  for 1 year (for example, a 30-pack-year history of smoking could mean smoking 1 pack a day for 30 years or 2 packs a day for 15 years). Yearly screening should continue until the smoker has stopped smoking for at least 15 years. Yearly screening should be stopped for people who develop a health problem that would prevent them from having lung cancer treatment.  If you choose to drink alcohol, do not have more than 2 drinks per day. One drink is considered to be 12 oz (360 mL) of beer, 5 oz (150 mL) of wine, or 1.5 oz (45 mL) of liquor.  Avoid the use of street drugs. Do not share needles with anyone. Ask for help if you need support or instructions about stopping the use of drugs.  High blood pressure causes heart disease and increases the risk of stroke. High blood pressure is more likely to develop in:  People who have blood pressure in the end of the normal range (100-139/85-89 mm Hg).  People who are overweight or obese.  People who are African American.  If you are 83-54 years of age, have your blood pressure checked every 3-5 years. If you are 13 years of age or older, have your blood pressure checked every year. You should have your blood pressure measured twice--once when you are at a hospital or clinic, and once when you are not at a hospital or clinic. Record the average of the two measurements. To check your blood pressure when you are not at a hospital or clinic, you can use:  An automated blood pressure machine at a pharmacy.  A home blood pressure monitor.  If you are 31-27 years old, ask your health care provider if you should take aspirin to prevent heart disease.  Diabetes screening involves taking a blood sample to check your fasting blood sugar level. This should be done once every 3 years after age 39 if you are at a normal weight and without risk factors for diabetes. Testing should be considered at a younger age or be carried out more frequently if you are overweight and  have at least 1 risk factor for diabetes.  Colorectal cancer can be detected and often prevented. Most routine colorectal cancer screening begins at the age of 12 and continues through age 77. However, your health care provider may recommend screening at an earlier age if you have risk factors for colon cancer. On a yearly basis, your health care provider may provide home test kits to check for hidden blood in the stool. A small camera at the end of a tube may be used to directly examine the colon (sigmoidoscopy or colonoscopy) to detect the earliest forms of colorectal cancer. Talk to your health care provider about this at age 22 when routine screening begins. A direct exam of the colon should be repeated every 5-10 years through age 71, unless early forms of precancerous polyps or small growths are found.  People who are at an increased risk  for hepatitis B should be screened for this virus. You are considered at high risk for hepatitis B if:  You were born in a country where hepatitis B occurs often. Talk with your health care provider about which countries are considered high risk.  Your parents were born in a high-risk country and you have not received a shot to protect against hepatitis B (hepatitis B vaccine).  You have HIV or AIDS.  You use needles to inject street drugs.  You live with, or have sex with, someone who has hepatitis B.  You are a man who has sex with other men (MSM).  You get hemodialysis treatment.  You take certain medicines for conditions like cancer, organ transplantation, and autoimmune conditions.  Hepatitis C blood testing is recommended for all people born from [birth year] through 1965 and any individual with known risk factors for hepatitis C.  Healthy men should no longer receive prostate-specific antigen (PSA) blood tests as part of routine cancer screening. Talk to your health care provider about prostate cancer screening.  Testicular cancer screening is not  recommended for adolescents or adult males who have no symptoms. Screening includes self-exam, a health care provider exam, and other screening tests. Consult with your health care provider about any symptoms you have or any concerns you have about testicular cancer.  Practice safe sex. Use condoms and avoid high-risk sexual practices to reduce the spread of sexually transmitted infections (STIs).  You should be screened for STIs, including gonorrhea and chlamydia if:  You are sexually active and are younger than 24 years.  You are older than 24 years, and your health care provider tells you that you are at risk for this type of infection.  Your sexual activity has changed since you were last screened, and you are at an increased risk for chlamydia or gonorrhea. Ask your health care provider if you are at risk.  If you are at risk of being infected with HIV, it is recommended that you take a prescription medicine daily to prevent HIV infection. This is called pre-exposure prophylaxis (PrEP). You are considered at risk if:  You are a man who has sex with other men (MSM).  You are a heterosexual man who is sexually active with multiple partners.  You take drugs by injection.  You are sexually active with a partner who has HIV.  Talk with your health care provider about whether you are at high risk of being infected with HIV. If you choose to begin PrEP, you should first be tested for HIV. You should then be tested every 3 months for as long as you are taking PrEP.  Use sunscreen. Apply sunscreen liberally and repeatedly throughout the day. You should seek shade when your shadow is shorter than you. Protect yourself by wearing long sleeves, pants, a wide-brimmed hat, and sunglasses year round whenever you are outdoors.  Tell your health care provider of new moles or changes in moles, especially if there is a change in shape or color. Also, tell your health care provider if a mole is larger  than the size of a pencil eraser.  A one-time screening for abdominal aortic aneurysm (AAA) and surgical repair of large AAAs by ultrasound is recommended for men aged 22-75 years who are current or former smokers.  Stay current with your vaccines (immunizations).   This information is not intended to replace advice given to you by your health care provider. Make sure you discuss any questions you have with your  health care provider.   Document Released: 08/13/2007 Document Revised: 03/07/2014 Document Reviewed: 07/12/2010 Elsevier Interactive Patient Education [employer].

## 2015-09-03 NOTE — Progress Notes (Signed)
Patient ID: Maurice Weaver, male   DOB: [birth year], 70 y.o.   MRN: [id number]    Subjective:   Maurice Weaver is a 70 y.o. male who presents for a Subsequent Medicare Annual Wellness Visit. Maurice Weaver is divorced and lives alone.  He has 2 children who are very supportive.  He has a small dog at home and is retired.  Review of Systems  Review of Systems  Constitutional: Negative.   HENT: Positive for hearing loss.   Eyes: Negative.   Respiratory: Positive for shortness of breath (patient states this is not a new symptoms but occurs from time to time and he thinks it is related to childhood asthma).   Cardiovascular: Negative.   Gastrointestinal: Positive for heartburn (contolled with PPI) and diarrhea (had colonoscopy 06/2015.  Has improved since advised by Dr Carlean Purl to use Imodium prn.).  Genitourinary: Negative.   Musculoskeletal: Negative.   Skin: Negative.   Neurological: Negative.   Endo/Heme/Allergies: Negative.   Psychiatric/Behavioral: Negative.        Current Medications (verified) Outpatient Encounter Prescriptions as of 09/03/2015  Medication Sig  . Cholecalciferol (VITAMIN D3) 5000 units CAPS Take 1 capsule by mouth daily.  Marland Kitchen ezetimibe (ZETIA) 10 MG tablet Take 1 tablet (10 mg total) by mouth at bedtime.  . hydrochlorothiazide (HYDRODIURIL) 25 MG tablet Take 1 tablet (25 mg total) by mouth daily.  . Loperamide HCl (IMODIUM A-D PO) Take 1 tablet by mouth daily as needed.  . metoprolol succinate (TOPROL XL) 100 MG 24 hr tablet Take 1 tablet (100 mg total) by mouth daily.  Marland Kitchen omeprazole (PRILOSEC) 40 MG capsule TAKE 1 CAPSULE  DAILY  . potassium chloride (K-DUR,KLOR-CON) 10 MEQ tablet Take 1 tablet (10 mEq total) by mouth daily.  . ramipril (ALTACE) 10 MG capsule Take 1 capsule (10 mg total) by mouth daily.  . rosuvastatin (CRESTOR) 20 MG tablet Take 1 tablet (20 mg total) by mouth at bedtime.  Marland Kitchen aspirin 81 MG EC tablet Take 1 tablet (81 mg total) by mouth daily.  Swallow whole.  . [DISCONTINUED] zolpidem (AMBIEN) 10 MG tablet Take 1 tablet (10 mg total) by mouth at bedtime as needed. (Patient not taking: Reported on 09/03/2015)   No facility-administered encounter medications on file as of 09/03/2015.    Allergies (verified) Ampicillin and Erythromycin   History: Past Medical History  Diagnosis Date  . Hiatal hernia   . Diverticulosis   . Hyperlipidemia   . AF (atrial fibrillation) ([place])   . Decreased hearing   . Nephrolithiasis   . CAD (coronary artery disease)   . ED (erectile dysfunction)   . Cholesteatoma   . S/P angioplasty   . GERD (gastroesophageal reflux disease)   . Fundic gland polyps of stomach, benign   . Schatzki's ring   . MRSA (methicillin resistant staph aureus) culture positive 07/2009  . Diverticulitis of colon     recurrent w/abscess - resected 2011  . Hypertension   . Vitamin D deficiency   . Melanoma ([place]) 2013    Right Cheek  . Asthma     as a child. stable   Past Surgical History  Procedure Laterality Date  . Colonoscopy  Multiple  . Esophagogastroduodenoscopy  Multiple  . Tonsillectomy and adenoidectomy    . Coronary angioplasty with stent placement  2000  . Melanoma excision  2012  . Colon surgery  2011    diverticulitis left resection-Toth  . Inner ear surgery Bilateral 2015    x6  cholesteatomas   Family History  Problem Relation Age of Onset  . Colon cancer Neg Hx   . Esophageal cancer Neg Hx   . Rectal cancer Neg Hx   . Stomach cancer Neg Hx   . Prostate cancer Neg Hx   . Pancreatic cancer Mother   . Hypertension Mother   . Heart disease Father     Died of MI age 34  . Urolithiasis Brother   . Arthritis Brother   . Dementia Brother   . Alzheimer's disease Sister    Social History   Occupational History  . Electronics    Social History Main Topics  . Smoking status: Former Smoker -- 0.50 packs/day for 30 years    Types: Cigarettes    Start date: 02/29/1960    Quit date: 04/29/1998    . Smokeless tobacco: Never Used     Comment: Patient quit "cold [place]" in 2000  . Alcohol Use: No  . Drug Use: No  . Sexual Activity: Not Currently    Do you feel safe at home?  Yes Are there smokers in your home (other than you)? No  Dietary issues and exercise activities discussed: Current Exercise Habits: Home exercise routine, Type of exercise: walking, Time (Minutes): 45, Frequency (Times/Week): 5, Weekly Exercise (Minutes/Week): 225, Intensity: Moderate  Current Dietary habits:  Eats lots of vegetables.  Sweet tea is mostly what he drinks.  Trys to choose low fat and high fiber foods.    Cardiac Risk Factors include: advanced age (>17men, >8 women);dyslipidemia;hypertension;male gender  Objective:    Today's Vitals   09/03/15 1435  BP: 122/80  Pulse: 84  Height: 5\' 10"  (1.778 m)  Weight: 184 lb (83.462 kg)  PainSc: 0-No pain   Body mass index is 26.4 kg/(m^2).   Activities of Daily Living In your present state of health, do you have any difficulty performing the following activities: 09/03/2015  Hearing? Y  Vision? N  Difficulty concentrating or making decisions? N  Walking or climbing stairs? N  Dressing or bathing? N  Doing errands, shopping? N  Preparing Food and eating ? N  Using the Toilet? N  In the past six months, have you accidently leaked urine? N  Do you have problems with loss of bowel control? N  Managing your Medications? N  Managing your Finances? N  Housekeeping or managing your Housekeeping? N     Depression Screen PHQ 2/9 Scores 09/03/2015 03/18/2015 10/23/2014 06/09/2014  PHQ - 2 Score 0 0 0 0     Fall Risk Fall Risk  09/03/2015 03/18/2015 10/23/2014 06/09/2014 05/27/2014  Falls in the past year? No No No No No  Number falls in past yr: - - - - -  Injury with Fall? - - - - -    Cognitive Function: MMSE - Mini Mental State Exam 09/03/2015 05/27/2014  Not completed: - Refused  Orientation to time 5 5  Orientation to Place 5 5  Registration 3  1  Attention/ Calculation 5 4  Recall 0 3  Language- name 2 objects 2 2  Language- repeat 1 1  Language- follow 3 step command 2 3  Language- read & follow direction 1 1  Write a sentence 1 1  Copy design 1 1  Total score 26 27    Immunizations and Health Maintenance Immunization History  Administered Date(s) Administered  . Influenza, High Dose Seasonal PF 12/29/2014  . Influenza,inj,quad, With Preservative 01/02/2014  . Influenza-Unspecified 01/23/2013  . Pneumococcal Conjugate-13 01/23/2013  .  Pneumococcal Polysaccharide-23 08/29/2010  . Tdap 06/28/2008  . Zoster 11/28/2005   Health Maintenance Due  Topic Date Due  . Hepatitis C Screening  12/09/1945    Patient Care Team: Chipper Herb, MD as PCP - General (Family Medicine) Melida Quitter, MD as Attending Physician (Otolaryngology) Gatha Mayer, MD as Attending Physician (Gastroenterology)  Indicate any recent Medical Services you may have received from other than [facility] providers in the past year (date may be approximate).    Assessment:    Annual Wellness Visit  intermittent SOB Loose stools - improved over last month  Screening Tests Health Maintenance  Topic Date Due  . Hepatitis C Screening  09-03-1945  . INFLUENZA VACCINE  09/29/2015  . COLON CANCER SCREENING ANNUAL FOBT  03/26/2016  . TETANUS/TDAP  06/29/2018  . COLONOSCOPY  07/01/2025  . ZOSTAVAX  Completed  . PNA vac Low Risk Adult  Completed        Plan:   During the course of the visit Karlis was educated and counseled about the following appropriate screening and preventive services:   Vaccines to include Pneumoccal, Influenza,  Td, Zostavax - all vaccines are UTD  Colorectal cancer screening - colonscopy is UTD   Cardiovascular disease screening - Last EKG was 06/2014; saw cardio 1 year ago.   Diabetes screening - UTD last FBG was 93  History of elevated Tg - due to have lipids rechecked at appt later this month.  Discussed limiting  sugar and CHO intake to help decreased both Tg and weight.  Glaucoma screening /  Eye Exam - due eye exam.  Patient reminded and offer to call to make appt for him but he declined  Nutrition counseling -See above with elevated Tg  Prostate cancer screening -UTD  Advanced Directives - UTD  Physical Activity - Continue to exercise daily - goal is 150 minutes per week and patient is meeting this goal.   Will get Hep C checked with labs later this month.  Restart ASA 81mg  1 tablet daily    Patient Instructions (the written plan) were given to the patient.   Cherre Robins, [facility]   09/03/2015

## 2015-09-15 ENCOUNTER — Ambulatory Visit (INDEPENDENT_AMBULATORY_CARE_PROVIDER_SITE_OTHER): Payer: Commercial Managed Care - HMO | Admitting: Family Medicine

## 2015-09-15 ENCOUNTER — Encounter: Payer: Self-pay | Admitting: Family Medicine

## 2015-09-15 VITALS — BP 135/90 | HR 71 | Temp 97.1°F | Ht 70.0 in | Wt 188.0 lb

## 2015-09-15 DIAGNOSIS — R195 Other fecal abnormalities: Secondary | ICD-10-CM

## 2015-09-15 DIAGNOSIS — R197 Diarrhea, unspecified: Secondary | ICD-10-CM | POA: Diagnosis not present

## 2015-09-15 DIAGNOSIS — K219 Gastro-esophageal reflux disease without esophagitis: Secondary | ICD-10-CM | POA: Diagnosis not present

## 2015-09-15 DIAGNOSIS — Z0000 Encounter for general adult medical examination without abnormal findings: Secondary | ICD-10-CM

## 2015-09-15 DIAGNOSIS — E559 Vitamin D deficiency, unspecified: Secondary | ICD-10-CM | POA: Diagnosis not present

## 2015-09-15 DIAGNOSIS — E785 Hyperlipidemia, unspecified: Secondary | ICD-10-CM | POA: Diagnosis not present

## 2015-09-15 DIAGNOSIS — I10 Essential (primary) hypertension: Secondary | ICD-10-CM | POA: Diagnosis not present

## 2015-09-15 DIAGNOSIS — H7192 Unspecified cholesteatoma, left ear: Secondary | ICD-10-CM

## 2015-09-15 DIAGNOSIS — H9193 Unspecified hearing loss, bilateral: Secondary | ICD-10-CM | POA: Diagnosis not present

## 2015-09-15 DIAGNOSIS — D509 Iron deficiency anemia, unspecified: Secondary | ICD-10-CM

## 2015-09-15 NOTE — Progress Notes (Signed)
Subjective:    Patient ID: Maurice Weaver, male    DOB: Sep 18, 1945, 70 y.o.   MRN: [id number]  HPI Pt here for follow up and management of chronic medical problems which includes hyperlipidemia and hypertension. He is taking medications regularly.The patient is doing well today with no specific complaints. He continues to be followed by the ear nose and throat specialist because of his hearing deficit and cholesteatoma. He has a history of melanoma hyperlipidemia hypertension and coronary atherosclerosis. The patient continues to have problems with his left ear which is his only good ear. He sees Dr. Redmond Baseman regularly for this. He is currently having some problems with his hearing more than usual and is not able to wear the hearing aid regularly. He denies any chest pain or shortness of breath. He denies any trouble with swallowing heartburn indigestion nausea vomiting or blood in the stool or black tarry bowel movements. He has had a colonoscopy since the last visit and he indicates polyps were found and there was no reason for the loose bowel movements that he continues to have. He was told by the gastroenterologist to take an Imodium once a day and this has helped even though the stools are not completely solid but he is better. He's passing his water without problems. He has an upcoming appointment to have his eyes checked. The patient does indicate that he was playing golf in [place] for couple days around June 1 and got very hot and took an Ambien one night to help him rest and had a seizure and ended up being taken to a hospital and or [facility] where he stayed for 3 days. He said the workup was very thorough that he had CT scans and a complete evaluation and that no problems were found. We will try to get records from that visit and he has gotten rid of the Ambien and says he will not take anymore.     Patient Active Problem List   Diagnosis Date Noted  . GERD  (gastroesophageal reflux disease) 01/23/2013  . Insomnia 01/23/2013  . Melanoma in situ of cheek, history of 06/13/2012  . Hearing deficit 06/13/2012  . Hyperlipidemia 12/24/2008  . HYPERTENSION, BENIGN 12/24/2008  . Coronary atherosclerosis of native coronary artery 12/24/2008   Outpatient Encounter Prescriptions as of 09/15/2015  Medication Sig  . aspirin 81 MG EC tablet Take 1 tablet (81 mg total) by mouth daily. Swallow whole.  . Cholecalciferol (VITAMIN D3) 5000 units CAPS Take 1 capsule by mouth daily.  Marland Kitchen ezetimibe (ZETIA) 10 MG tablet Take 1 tablet (10 mg total) by mouth at bedtime.  . hydrochlorothiazide (HYDRODIURIL) 25 MG tablet Take 1 tablet (25 mg total) by mouth daily.  . Loperamide HCl (IMODIUM A-D PO) Take 1 tablet by mouth daily as needed.  . metoprolol succinate (TOPROL XL) 100 MG 24 hr tablet Take 1 tablet (100 mg total) by mouth daily.  Marland Kitchen omeprazole (PRILOSEC) 40 MG capsule TAKE 1 CAPSULE  DAILY  . potassium chloride (K-DUR,KLOR-CON) 10 MEQ tablet Take 1 tablet (10 mEq total) by mouth daily.  . ramipril (ALTACE) 10 MG capsule Take 1 capsule (10 mg total) by mouth daily.  . rosuvastatin (CRESTOR) 20 MG tablet Take 1 tablet (20 mg total) by mouth at bedtime.   No facility-administered encounter medications on file as of 09/15/2015.      Review of Systems  Constitutional: Negative.   HENT: Negative.   Eyes: Negative.   Respiratory: Negative.  Cardiovascular: Negative.   Gastrointestinal: Negative.   Endocrine: Negative.   Genitourinary: Negative.   Musculoskeletal: Negative.   Skin: Negative.   Allergic/Immunologic: Negative.   Neurological: Negative.   Hematological: Negative.   Psychiatric/Behavioral: Negative.        Objective:   Physical Exam  Constitutional: He is oriented to person, place, and time. He appears well-developed and well-nourished. No distress.  HENT:  Head: Normocephalic and atraumatic.  Nose: Nose normal.  Mouth/Throat: Oropharynx  is clear and moist. No oropharyngeal exudate.  Diminished hearing Hearing aids bilaterally with scarred TMs  Eyes: Conjunctivae and EOM are normal. Pupils are equal, round, and reactive to light. Right eye exhibits no discharge. Left eye exhibits no discharge. No scleral icterus.  Neck: Normal range of motion. Neck supple. No thyromegaly present.  No anterior cervical adenopathy or bruits  Cardiovascular: Normal rate, regular rhythm, normal heart sounds and intact distal pulses.   No murmur heard. Heart is regular at 72/m  Pulmonary/Chest: Effort normal and breath sounds normal. No respiratory distress. He has no wheezes. He has no rales. He exhibits no tenderness.  Clear anteriorly and posteriorly  Abdominal: Soft. Bowel sounds are normal. He exhibits no mass. There is no tenderness. There is no rebound and no guarding.  No abdominal tenderness masses or organ enlargement or bruits  Musculoskeletal: Normal range of motion. He exhibits no edema or tenderness.  Lymphadenopathy:    He has no cervical adenopathy.  Neurological: He is alert and oriented to person, place, and time. He has normal reflexes. No cranial nerve deficit.  Skin: Skin is warm and dry. No rash noted.  Psychiatric: He has a normal mood and affect. His behavior is normal. Judgment and thought content normal.  Nursing note and vitals reviewed.   BP 135/90 mmHg  Pulse 71  Temp(Src) 97.1 F (36.2 C) (Oral)  Ht '5\' 10"'  (1.778 m)  Wt 188 lb (85.276 kg)  BMI 26.98 kg/m2       Assessment & Plan:  1. HYPERTENSION, BENIGN -The blood pressure is good today he will continue with current treatment - BMP8+EGFR - CBC with Differential/Platelet - Hepatic function panel  2. Vitamin D deficiency -Continue with current treatment pending results of lab work - CBC with Differential/Platelet - VITAMIN D 25 Hydroxy (Vit-D Deficiency, Fractures)  3. Gastroesophageal reflux disease, esophagitis presence not specified -The patient  is having no complaints with this and he will continue with his omeprazole - CBC with Differential/Platelet  4. Hyperlipemia -Continue with aggressive therapeutic lifestyle changes and Crestor along with Zetia. - CBC with Differential/Platelet - NMR, lipoprofile  5. Anemia, iron deficiency - CBC with Differential/Platelet  6. Hearing deficit, bilateral -Continue to follow-up with ear nose and throat, Dr. Redmond Baseman - CBC with Differential/Platelet  7. Health care maintenance - Thyroid Panel With TSH  8. Cholesteatoma, left -Follow-up with Dr. Redmond Baseman  9. Loose bowel movements -Continue Imodium 1 daily and if the loose stools persist or get worse patient will get back in touch with gastroenterologist  Patient Instructions                       Medicare Annual Wellness Visit  [facility] and the medical providers at [facility] strive to bring you the best medical care.  In doing so we not only want to address your current medical conditions and concerns but also to detect new conditions early and prevent illness, disease and health-related problems.    Medicare  offers a yearly Wellness Visit which allows our clinical staff to assess your need for preventative services including immunizations, lifestyle education, counseling to decrease risk of preventable diseases and screening for fall risk and other medical concerns.    This visit is provided free of charge (no copay) for all Medicare recipients. The clinical pharmacists at [facility] have begun to conduct these Wellness Visits which will also include a thorough review of all your medications.    As you primary medical provider recommend that you make an appointment for your Annual Wellness Visit if you have not done so already this year.  You may set up this appointment before you leave today or you may call back ([phone]) and schedule an appointment.  Please make sure when you call that you  mention that you are scheduling your Annual Wellness Visit with the clinical pharmacist so that the appointment may be made for the proper length of time.     Continue current medications. Continue good therapeutic lifestyle changes which include good diet and exercise. Fall precautions discussed with patient. If an FOBT was given today- please return it to our front desk. If you are over 23 years old - you may need Prevnar 72 or the adult Pneumonia vaccine.  **Flu shots are available--- please call and schedule a FLU-CLINIC appointment**  After your visit with [place] today you will receive a survey in the mail or online from [employer] regarding your care with [place]. Please take a moment to fill this out. Your feedback is very important to [place] as you can help [place] better understand your patient needs as well as improve your experience and satisfaction. WE CARE ABOUT YOU!!!   Continue to follow-up with ear nose and throat If loose bowel movements percent get back in touch with [name] as they're could be other options for your loose bowel movements We will try to get an official copy of the record from your hospitalization at [facility] Do not take any more Ambien     Arrie Senate MD

## 2015-09-15 NOTE — Patient Instructions (Addendum)
Medicare Annual Wellness Visit  [facility] and the medical providers at [facility] strive to bring you the best medical care.  In doing so we not only want to address your current medical conditions and concerns but also to detect new conditions early and prevent illness, disease and health-related problems.    Medicare offers a yearly Wellness Visit which allows our clinical staff to assess your need for preventative services including immunizations, lifestyle education, counseling to decrease risk of preventable diseases and screening for fall risk and other medical concerns.    This visit is provided free of charge (no copay) for all Medicare recipients. The clinical pharmacists at [facility] have begun to conduct these Wellness Visits which will also include a thorough review of all your medications.    As you primary medical provider recommend that you make an appointment for your Annual Wellness Visit if you have not done so already this year.  You may set up this appointment before you leave today or you may call back [phone] and schedule an appointment.  Please make sure when you call that you mention that you are scheduling your Annual Wellness Visit with the clinical pharmacist so that the appointment may be made for the proper length of time.     Continue current medications. Continue good therapeutic lifestyle changes which include good diet and exercise. Fall precautions discussed with patient. If an FOBT was given today- please return it to our front desk. If you are over 35 years old - you may need Prevnar 25 or the adult Pneumonia vaccine.  **Flu shots are available--- please call and schedule a FLU-CLINIC appointment**  After your visit with [place] today you will receive a survey in the mail or online from [employer] regarding your care with [place]. Please take a moment to fill this out. Your feedback is very  important to [place] as you can help [place] better understand your patient needs as well as improve your experience and satisfaction. WE CARE ABOUT YOU!!!   Continue to follow-up with ear nose and throat If loose bowel movements percent get back in touch with [name] as they're could be other options for your loose bowel movements We will try to get an official copy of the record from your hospitalization at [facility] Do not take any more Ambien

## 2015-09-16 LAB — CBC WITH DIFFERENTIAL/PLATELET
BASOS ABS: 0 10*3/uL (ref 0.0–0.2)
Basos: 0 %
EOS (ABSOLUTE): 0.1 10*3/uL (ref 0.0–0.4)
Eos: 1 %
Hematocrit: 47.4 % (ref 37.5–51.0)
Hemoglobin: 16 g/dL (ref 12.6–17.7)
IMMATURE GRANS (ABS): 0 10*3/uL (ref 0.0–0.1)
IMMATURE GRANULOCYTES: 0 %
LYMPHS: 36 %
Lymphocytes Absolute: 2.7 10*3/uL (ref 0.7–3.1)
MCH: 31.7 pg (ref 26.6–33.0)
MCHC: 33.8 g/dL (ref 31.5–35.7)
MCV: 94 fL (ref 79–97)
MONOCYTES: 7 %
Monocytes Absolute: 0.5 10*3/uL (ref 0.1–0.9)
NEUTROS ABS: 4.2 10*3/uL (ref 1.4–7.0)
NEUTROS PCT: 56 %
PLATELETS: 233 10*3/uL (ref 150–379)
RBC: 5.04 x10E6/uL (ref 4.14–5.80)
RDW: 12.8 % (ref 12.3–15.4)
WBC: 7.5 10*3/uL (ref 3.4–10.8)

## 2015-09-16 LAB — BMP8+EGFR
BUN/Creatinine Ratio: 18 (ref 10–24)
BUN: 19 mg/dL (ref 8–27)
CALCIUM: 10.1 mg/dL (ref 8.6–10.2)
CHLORIDE: 97 mmol/L (ref 96–106)
CO2: 25 mmol/L (ref 18–29)
Creatinine, Ser: 1.04 mg/dL (ref 0.76–1.27)
GFR calc Af Amer: 84 mL/min/{1.73_m2} (ref >59)
GFR calc non Af Amer: 72 mL/min/{1.73_m2} (ref >59)
GLUCOSE: 97 mg/dL (ref 65–99)
POTASSIUM: 4.4 mmol/L (ref 3.5–5.2)
Sodium: 139 mmol/L (ref 134–144)

## 2015-09-16 LAB — HEPATIC FUNCTION PANEL
ALBUMIN: 5 g/dL — AB (ref 3.5–4.8)
ALT: 20 IU/L (ref 0–44)
AST: 19 IU/L (ref 0–40)
Alkaline Phosphatase: 64 IU/L (ref 39–117)
BILIRUBIN, DIRECT: 0.23 mg/dL (ref 0.00–0.40)
Bilirubin Total: 1 mg/dL (ref 0.0–1.2)
TOTAL PROTEIN: 7.1 g/dL (ref 6.0–8.5)

## 2015-09-16 LAB — NMR, LIPOPROFILE
Cholesterol: 146 mg/dL (ref 100–199)
HDL CHOLESTEROL BY NMR: 40 mg/dL (ref >39)
HDL PARTICLE NUMBER: 34.5 umol/L (ref >=30.5)
LDL PARTICLE NUMBER: 1109 nmol/L — AB (ref <1000)
LDL Size: 20 nm (ref >20.5)
LDL-C: 62 mg/dL (ref 0–99)
LP-IR SCORE: 81 — AB (ref <=45)
SMALL LDL PARTICLE NUMBER: 772 nmol/L — AB (ref <=527)
Triglycerides by NMR: 222 mg/dL — ABNORMAL HIGH (ref 0–149)

## 2015-09-16 LAB — THYROID PANEL WITH TSH
FREE THYROXINE INDEX: 2.2 (ref 1.2–4.9)
T3 Uptake Ratio: 30 % (ref 24–39)
T4, Total: 7.2 ug/dL (ref 4.5–12.0)
TSH: 1.92 u[IU]/mL (ref 0.450–4.500)

## 2015-09-16 LAB — VITAMIN D 25 HYDROXY (VIT D DEFICIENCY, FRACTURES): VIT D 25 HYDROXY: 40.6 ng/mL (ref 30.0–100.0)

## 2015-09-28 DIAGNOSIS — H906 Mixed conductive and sensorineural hearing loss, bilateral: Secondary | ICD-10-CM | POA: Diagnosis not present

## 2015-09-28 DIAGNOSIS — H722X2 Other marginal perforations of tympanic membrane, left ear: Secondary | ICD-10-CM | POA: Diagnosis not present

## 2015-09-28 DIAGNOSIS — H7192 Unspecified cholesteatoma, left ear: Secondary | ICD-10-CM | POA: Diagnosis not present

## 2015-10-06 ENCOUNTER — Encounter: Payer: Self-pay | Admitting: Family Medicine

## 2015-10-06 DIAGNOSIS — K449 Diaphragmatic hernia without obstruction or gangrene: Secondary | ICD-10-CM | POA: Insufficient documentation

## 2015-10-06 DIAGNOSIS — I700 Atherosclerosis of aorta: Secondary | ICD-10-CM | POA: Insufficient documentation

## 2015-10-20 DIAGNOSIS — H722X2 Other marginal perforations of tympanic membrane, left ear: Secondary | ICD-10-CM | POA: Diagnosis not present

## 2015-10-20 DIAGNOSIS — H906 Mixed conductive and sensorineural hearing loss, bilateral: Secondary | ICD-10-CM | POA: Diagnosis not present

## 2015-10-20 DIAGNOSIS — H7192 Unspecified cholesteatoma, left ear: Secondary | ICD-10-CM | POA: Diagnosis not present

## 2015-10-20 DIAGNOSIS — H90A32 Mixed conductive and sensorineural hearing loss, unilateral, left ear with restricted hearing on the contralateral side: Secondary | ICD-10-CM | POA: Diagnosis not present

## 2015-12-04 ENCOUNTER — Other Ambulatory Visit: Payer: Self-pay | Admitting: Family Medicine

## 2015-12-22 ENCOUNTER — Other Ambulatory Visit: Payer: Self-pay | Admitting: Family Medicine

## 2016-01-26 DIAGNOSIS — H938X2 Other specified disorders of left ear: Secondary | ICD-10-CM | POA: Diagnosis not present

## 2016-01-26 DIAGNOSIS — H906 Mixed conductive and sensorineural hearing loss, bilateral: Secondary | ICD-10-CM | POA: Diagnosis not present

## 2016-01-26 DIAGNOSIS — H7192 Unspecified cholesteatoma, left ear: Secondary | ICD-10-CM | POA: Diagnosis not present

## 2016-01-26 DIAGNOSIS — H9212 Otorrhea, left ear: Secondary | ICD-10-CM | POA: Diagnosis not present

## 2016-01-26 DIAGNOSIS — H722X2 Other marginal perforations of tympanic membrane, left ear: Secondary | ICD-10-CM | POA: Diagnosis not present

## 2016-01-26 DIAGNOSIS — Z974 Presence of external hearing-aid: Secondary | ICD-10-CM | POA: Diagnosis not present

## 2016-01-27 ENCOUNTER — Encounter: Payer: Self-pay | Admitting: Family Medicine

## 2016-01-27 ENCOUNTER — Ambulatory Visit (INDEPENDENT_AMBULATORY_CARE_PROVIDER_SITE_OTHER): Payer: Commercial Managed Care - HMO | Admitting: Family Medicine

## 2016-01-27 VITALS — BP 121/73 | HR 67 | Temp 97.1°F | Ht 70.0 in | Wt 186.0 lb

## 2016-01-27 DIAGNOSIS — E7800 Pure hypercholesterolemia, unspecified: Secondary | ICD-10-CM

## 2016-01-27 DIAGNOSIS — K219 Gastro-esophageal reflux disease without esophagitis: Secondary | ICD-10-CM

## 2016-01-27 DIAGNOSIS — C439 Malignant melanoma of skin, unspecified: Secondary | ICD-10-CM | POA: Diagnosis not present

## 2016-01-27 DIAGNOSIS — I10 Essential (primary) hypertension: Secondary | ICD-10-CM

## 2016-01-27 DIAGNOSIS — H9193 Unspecified hearing loss, bilateral: Secondary | ICD-10-CM

## 2016-01-27 DIAGNOSIS — Z23 Encounter for immunization: Secondary | ICD-10-CM | POA: Diagnosis not present

## 2016-01-27 DIAGNOSIS — H7192 Unspecified cholesteatoma, left ear: Secondary | ICD-10-CM | POA: Diagnosis not present

## 2016-01-27 DIAGNOSIS — E559 Vitamin D deficiency, unspecified: Secondary | ICD-10-CM | POA: Diagnosis not present

## 2016-01-27 DIAGNOSIS — I700 Atherosclerosis of aorta: Secondary | ICD-10-CM

## 2016-01-27 DIAGNOSIS — D509 Iron deficiency anemia, unspecified: Secondary | ICD-10-CM

## 2016-01-27 NOTE — Progress Notes (Signed)
Subjective:    Patient ID: Maurice Weaver, male    DOB: 1946/01/27, 70 y.o.   MRN: [id number]  HPI Pt here for follow up and management of chronic medical problems which includes hyperlipidemia and hypertension. He is taking medications regularly.The patient is doing well overall with no specific complaints. His weight is down a couple of pounds. He does not need any refills. He will get his flu shot today and his lab work done today. The patient continues to be followed by his nose and throat specialist, Dr. Redmond Baseman, for his cholesteatoma and diminished hearing in his left ear. He just saw him recently and we'll see him again in January. The patient denies any chest pain shortness of breath palpitations. He denies any trouble with his GI tract other than loose bowel movements and has had a recent colonoscopy and was told to take Imodium once a day. He has a formed bowel movement in the morning and it may be loose or during the day. He denies any blood in the stool black tarry bowel movements or nausea and vomiting. He's passing his water without problems.     Patient Active Problem List   Diagnosis Date Noted  . Thoracic aortic atherosclerosis ([place]) 10/06/2015  . Hiatal hernia 10/06/2015  . GERD (gastroesophageal reflux disease) 01/23/2013  . Insomnia 01/23/2013  . Melanoma in situ of cheek, history of 06/13/2012  . Hearing deficit 06/13/2012  . Hyperlipidemia 12/24/2008  . HYPERTENSION, BENIGN 12/24/2008  . Coronary atherosclerosis of native coronary artery 12/24/2008   Outpatient Encounter Prescriptions as of 01/27/2016  Medication Sig  . aspirin 81 MG EC tablet Take 1 tablet (81 mg total) by mouth daily. Swallow whole.  . Cholecalciferol (VITAMIN D3) 5000 units CAPS Take 1 capsule by mouth daily.  Marland Kitchen ezetimibe (ZETIA) 10 MG tablet Take 1 tablet (10 mg total) by mouth at bedtime.  . hydrochlorothiazide (HYDRODIURIL) 25 MG tablet Take 1 tablet (25 mg total) by mouth daily.  .  Loperamide HCl (IMODIUM A-D PO) Take 1 tablet by mouth daily as needed.  . metoprolol succinate (TOPROL XL) 100 MG 24 hr tablet Take 1 tablet (100 mg total) by mouth daily.  Marland Kitchen omeprazole (PRILOSEC) 40 MG capsule TAKE 1 CAPSULE  DAILY  . potassium chloride (K-DUR,KLOR-CON) 10 MEQ tablet Take 1 tablet (10 mEq total) by mouth daily.  . ramipril (ALTACE) 10 MG capsule TAKE 1 CAPSULE EVERY DAY  . rosuvastatin (CRESTOR) 20 MG tablet Take 1 tablet (20 mg total) by mouth at bedtime.   No facility-administered encounter medications on file as of 01/27/2016.       Review of Systems  Constitutional: Negative.   HENT: Negative.   Eyes: Negative.   Respiratory: Negative.   Cardiovascular: Negative.   Gastrointestinal: Negative.   Endocrine: Negative.   Genitourinary: Negative.   Musculoskeletal: Negative.   Skin: Negative.   Allergic/Immunologic: Negative.   Neurological: Negative.   Hematological: Negative.   Psychiatric/Behavioral: Negative.        Objective:   Physical Exam  Constitutional: He is oriented to person, place, and time. He appears well-developed and well-nourished. No distress.  HENT:  Head: Normocephalic and atraumatic.  Right Ear: External ear normal.  Left Ear: External ear normal.  Mouth/Throat: Oropharynx is clear and moist. No oropharyngeal exudate.  Nasal congestion left. The patient is wearing bilateral hearing aids. The left TM is definitely scarred and the small growth is a visible and the superior part of the ear drum.  Eyes:  Conjunctivae and EOM are normal. Pupils are equal, round, and reactive to light. Right eye exhibits no discharge. Left eye exhibits no discharge. No scleral icterus.  Neck: Normal range of motion. Neck supple. No thyromegaly present.  No bruits thyromegaly or anterior cervical adenopathy  Cardiovascular: Normal rate, regular rhythm, normal heart sounds and intact distal pulses.   No murmur heard. The heart has a regular rate and rhythm at  72/m  Pulmonary/Chest: Effort normal and breath sounds normal. No respiratory distress. He has no wheezes. He has no rales. He exhibits no tenderness.  No axillary adenopathy. The lungs are clear anteriorly and posteriorly. No chest wall masses.  Abdominal: Soft. Bowel sounds are normal. He exhibits no mass. There is no tenderness. There is no rebound and no guarding.  No tenderness or organ enlargement masses or bruits. No inguinal adenopathy.  Musculoskeletal: Normal range of motion. He exhibits no edema.  Lymphadenopathy:    He has no cervical adenopathy.  Neurological: He is alert and oriented to person, place, and time. He has normal reflexes. No cranial nerve deficit.  The patient has no hearing on his right side. The amplification from the right side is referred to the left side. He is followed regularly by his ear nose and throat specialist.  Skin: Skin is warm and dry. No rash noted.  Psychiatric: He has a normal mood and affect. His behavior is normal. Judgment and thought content normal.  Nursing note and vitals reviewed.  BP 121/73 (BP Location: Left Arm)   Pulse 67   Temp 97.1 F (36.2 C) (Oral)   Ht '5\' 10"'  (1.778 m)   Wt 186 lb (84.4 kg)   BMI 26.69 kg/m         Assessment & Plan:  1. HYPERTENSION, BENIGN -The blood pressure is good today and he will continue with current treatment - BMP8+EGFR - CBC with Differential/Platelet - Hepatic function panel  2. Vitamin D deficiency -Continue with current treatment pending results of lab work - CBC with Differential/Platelet - VITAMIN D 25 Hydroxy (Vit-D Deficiency, Fractures)  3. Gastroesophageal reflux disease, esophagitis presence not specified -The patient is having no symptoms with his reflux disease and he will continue with current treatment and diet - CBC with Differential/Platelet - Hepatic function panel  4. Pure hypercholesterolemia -Continue with current treatment and aggressive therapeutic lifestyle  changes pending results of lab work - CBC with Differential/Platelet - NMR, lipoprofile  5. Iron deficiency anemia, unspecified iron deficiency anemia type - CBC with Differential/Platelet  6. Hearing deficit, bilateral -Continue to follow-up with ENT - CBC with Differential/Platelet  7. Cholesteatoma, left -Continue to follow-up with ENT  8. Thoracic aortic atherosclerosis ([place]) -Continue aggressive therapeutic lifestyle changes and aggressive use of taking statin drugs.  9. Melanoma of skin ([place]) -Follow-up with dermatology as planned  Patient Instructions                       Medicare Annual Wellness Visit  [facility] and the medical providers at [facility] strive to bring you the best medical care.  In doing so we not only want to address your current medical conditions and concerns but also to detect new conditions early and prevent illness, disease and health-related problems.    Medicare offers a yearly Wellness Visit which allows our clinical staff to assess your need for preventative services including immunizations, lifestyle education, counseling to decrease risk of preventable diseases and screening for fall risk and  other medical concerns.    This visit is provided free of charge (no copay) for all Medicare recipients. The clinical pharmacists at [facility] have begun to conduct these Wellness Visits which will also include a thorough review of all your medications.    As you primary medical provider recommend that you make an appointment for your Annual Wellness Visit if you have not done so already this year.  You may set up this appointment before you leave today or you may call back ([phone]) and schedule an appointment.  Please make sure when you call that you mention that you are scheduling your Annual Wellness Visit with the clinical pharmacist so that the appointment may be made for the proper length of time.      Continue current medications. Continue good therapeutic lifestyle changes which include good diet and exercise. Fall precautions discussed with patient. If an FOBT was given today- please return it to our front desk. If you are over 66 years old - you may need Prevnar 77 or the adult Pneumonia vaccine.  **Flu shots are available--- please call and schedule a FLU-CLINIC appointment**  After your visit with [place] today you will receive a survey in the mail or online from [employer] regarding your care with [place]. Please take a moment to fill this out. Your feedback is very important to [place] as you can help [place] better understand your patient needs as well as improve your experience and satisfaction. WE CARE ABOUT YOU!!!   The flu shot that you received today may make your arm sore Remember to diminish the use of the overhead fans as this dry sure airways out and makes him more susceptible to allergens and viruses. This winter keep the house as cool as possible drink plenty of fluids and use humidification if necessary T to follow-up with the ear nose and throat specialist  Arrie Senate MD

## 2016-01-27 NOTE — Patient Instructions (Addendum)
Medicare Annual Wellness Visit  [facility] and the medical providers at [facility] strive to bring you the best medical care.  In doing so we not only want to address your current medical conditions and concerns but also to detect new conditions early and prevent illness, disease and health-related problems.    Medicare offers a yearly Wellness Visit which allows our clinical staff to assess your need for preventative services including immunizations, lifestyle education, counseling to decrease risk of preventable diseases and screening for fall risk and other medical concerns.    This visit is provided free of charge (no copay) for all Medicare recipients. The clinical pharmacists at [facility] have begun to conduct these Wellness Visits which will also include a thorough review of all your medications.    As you primary medical provider recommend that you make an appointment for your Annual Wellness Visit if you have not done so already this year.  You may set up this appointment before you leave today or you may call back [phone] and schedule an appointment.  Please make sure when you call that you mention that you are scheduling your Annual Wellness Visit with the clinical pharmacist so that the appointment may be made for the proper length of time.     Continue current medications. Continue good therapeutic lifestyle changes which include good diet and exercise. Fall precautions discussed with patient. If an FOBT was given today- please return it to our front desk. If you are over 29 years old - you may need Prevnar 77 or the adult Pneumonia vaccine.  **Flu shots are available--- please call and schedule a FLU-CLINIC appointment**  After your visit with [place] today you will receive a survey in the mail or online from [employer] regarding your care with [place]. Please take a moment to fill this out. Your feedback is very  important to [place] as you can help [place] better understand your patient needs as well as improve your experience and satisfaction. WE CARE ABOUT YOU!!!   The flu shot that you received today may make your arm sore Remember to diminish the use of the overhead fans as this dry sure airways out and makes him more susceptible to allergens and viruses. This winter keep the house as cool as possible drink plenty of fluids and use humidification if necessary T to follow-up with the ear nose and throat specialist

## 2016-01-28 LAB — CBC WITH DIFFERENTIAL/PLATELET
BASOS: 0 %
Basophils Absolute: 0 10*3/uL (ref 0.0–0.2)
EOS (ABSOLUTE): 0 10*3/uL (ref 0.0–0.4)
EOS: 1 %
HEMATOCRIT: 45.2 % (ref 37.5–51.0)
Hemoglobin: 15.8 g/dL (ref 12.6–17.7)
IMMATURE GRANULOCYTES: 1 %
Immature Grans (Abs): 0 10*3/uL (ref 0.0–0.1)
LYMPHS ABS: 2.1 10*3/uL (ref 0.7–3.1)
Lymphs: 33 %
MCH: 32.7 pg (ref 26.6–33.0)
MCHC: 35 g/dL (ref 31.5–35.7)
MCV: 94 fL (ref 79–97)
MONOS ABS: 0.5 10*3/uL (ref 0.1–0.9)
Monocytes: 7 %
NEUTROS ABS: 3.9 10*3/uL (ref 1.4–7.0)
NEUTROS PCT: 58 %
Platelets: 223 10*3/uL (ref 150–379)
RBC: 4.83 x10E6/uL (ref 4.14–5.80)
RDW: 13.1 % (ref 12.3–15.4)
WBC: 6.6 10*3/uL (ref 3.4–10.8)

## 2016-01-28 LAB — BMP8+EGFR
BUN / CREAT RATIO: 15 (ref 10–24)
BUN: 17 mg/dL (ref 8–27)
CO2: 27 mmol/L (ref 18–29)
CREATININE: 1.1 mg/dL (ref 0.76–1.27)
Calcium: 10.3 mg/dL — ABNORMAL HIGH (ref 8.6–10.2)
Chloride: 96 mmol/L (ref 96–106)
GFR, EST AFRICAN AMERICAN: 78 mL/min/{1.73_m2} (ref >59)
GFR, EST NON AFRICAN AMERICAN: 67 mL/min/{1.73_m2} (ref >59)
Glucose: 91 mg/dL (ref 65–99)
POTASSIUM: 3.7 mmol/L (ref 3.5–5.2)
SODIUM: 141 mmol/L (ref 134–144)

## 2016-01-28 LAB — NMR, LIPOPROFILE
Cholesterol: 144 mg/dL (ref 100–199)
HDL CHOLESTEROL BY NMR: 37 mg/dL — AB (ref >39)
HDL Particle Number: 29.5 umol/L — ABNORMAL LOW (ref >=30.5)
LDL PARTICLE NUMBER: 1065 nmol/L — AB (ref <1000)
LDL SIZE: 20.3 nm (ref >20.5)
LDL-C: 74 mg/dL (ref 0–99)
LP-IR Score: 65 — ABNORMAL HIGH (ref <=45)
SMALL LDL PARTICLE NUMBER: 615 nmol/L — AB (ref <=527)
Triglycerides by NMR: 163 mg/dL — ABNORMAL HIGH (ref 0–149)

## 2016-01-28 LAB — HEPATIC FUNCTION PANEL
ALT: 20 IU/L (ref 0–44)
AST: 17 IU/L (ref 0–40)
Albumin: 4.7 g/dL (ref 3.5–4.8)
Alkaline Phosphatase: 55 IU/L (ref 39–117)
BILIRUBIN TOTAL: 1.4 mg/dL — AB (ref 0.0–1.2)
Bilirubin, Direct: 0.26 mg/dL (ref 0.00–0.40)
Total Protein: 7.2 g/dL (ref 6.0–8.5)

## 2016-01-28 LAB — VITAMIN D 25 HYDROXY (VIT D DEFICIENCY, FRACTURES): VIT D 25 HYDROXY: 36.6 ng/mL (ref 30.0–100.0)

## 2016-02-18 ENCOUNTER — Encounter: Payer: Self-pay | Admitting: *Deleted

## 2016-03-02 DIAGNOSIS — H7192 Unspecified cholesteatoma, left ear: Secondary | ICD-10-CM | POA: Diagnosis not present

## 2016-03-02 DIAGNOSIS — H9212 Otorrhea, left ear: Secondary | ICD-10-CM | POA: Diagnosis not present

## 2016-03-02 DIAGNOSIS — H906 Mixed conductive and sensorineural hearing loss, bilateral: Secondary | ICD-10-CM | POA: Diagnosis not present

## 2016-03-15 ENCOUNTER — Telehealth: Payer: Self-pay | Admitting: Family Medicine

## 2016-03-15 MED ORDER — OMEPRAZOLE 40 MG PO CPDR: DELAYED_RELEASE_CAPSULE | ORAL | 1 refills | Status: DC

## 2016-03-15 MED ORDER — ROSUVASTATIN CALCIUM 20 MG PO TABS: 20.0000 mg | ORAL_TABLET | Freq: Every day | ORAL | 1 refills | Status: DC

## 2016-03-15 MED ORDER — HYDROCHLOROTHIAZIDE 25 MG PO TABS: 25.0000 mg | ORAL_TABLET | Freq: Every day | ORAL | 1 refills | Status: DC

## 2016-03-15 MED ORDER — EZETIMIBE 10 MG PO TABS: 10.0000 mg | ORAL_TABLET | Freq: Every day | ORAL | 1 refills | Status: DC

## 2016-03-15 MED ORDER — RAMIPRIL 10 MG PO CAPS: 10.0000 mg | ORAL_CAPSULE | Freq: Every day | ORAL | 1 refills | Status: DC

## 2016-03-15 MED ORDER — POTASSIUM CHLORIDE CRYS ER 10 MEQ PO TBCR: 10.0000 meq | EXTENDED_RELEASE_TABLET | Freq: Every day | ORAL | 1 refills | Status: DC

## 2016-03-15 MED ORDER — METOPROLOL SUCCINATE ER 100 MG PO TB24: 100.0000 mg | ORAL_TABLET | Freq: Every day | ORAL | 1 refills | Status: DC

## 2016-03-15 NOTE — Telephone Encounter (Signed)
Refills sent to mail order pharmacy per protocol

## 2016-06-07 ENCOUNTER — Ambulatory Visit (INDEPENDENT_AMBULATORY_CARE_PROVIDER_SITE_OTHER): Payer: Medicare HMO | Admitting: Family Medicine

## 2016-06-07 ENCOUNTER — Encounter: Payer: Self-pay | Admitting: Family Medicine

## 2016-06-07 VITALS — BP 112/72 | HR 69 | Temp 97.2°F | Ht 70.0 in | Wt 186.0 lb

## 2016-06-07 DIAGNOSIS — N400 Enlarged prostate without lower urinary tract symptoms: Secondary | ICD-10-CM | POA: Diagnosis not present

## 2016-06-07 DIAGNOSIS — I10 Essential (primary) hypertension: Secondary | ICD-10-CM | POA: Diagnosis not present

## 2016-06-07 DIAGNOSIS — H9193 Unspecified hearing loss, bilateral: Secondary | ICD-10-CM

## 2016-06-07 DIAGNOSIS — E559 Vitamin D deficiency, unspecified: Secondary | ICD-10-CM | POA: Diagnosis not present

## 2016-06-07 DIAGNOSIS — E7800 Pure hypercholesterolemia, unspecified: Secondary | ICD-10-CM

## 2016-06-07 DIAGNOSIS — K219 Gastro-esophageal reflux disease without esophagitis: Secondary | ICD-10-CM

## 2016-06-07 DIAGNOSIS — C439 Malignant melanoma of skin, unspecified: Secondary | ICD-10-CM

## 2016-06-07 DIAGNOSIS — I700 Atherosclerosis of aorta: Secondary | ICD-10-CM

## 2016-06-07 DIAGNOSIS — D508 Other iron deficiency anemias: Secondary | ICD-10-CM

## 2016-06-07 LAB — URINALYSIS, COMPLETE
BILIRUBIN UA: NEGATIVE
GLUCOSE, UA: NEGATIVE
Ketones, UA: NEGATIVE
LEUKOCYTES UA: NEGATIVE
NITRITE UA: NEGATIVE
Protein, UA: NEGATIVE
RBC UA: NEGATIVE
SPEC GRAV UA: 1.025 (ref 1.005–1.030)
Urobilinogen, Ur: 0.2 mg/dL (ref 0.2–1.0)
pH, UA: 5 (ref 5.0–7.5)

## 2016-06-07 LAB — MICROSCOPIC EXAMINATION
Bacteria, UA: NONE SEEN
EPITHELIAL CELLS (NON RENAL): NONE SEEN /HPF (ref 0–10)
RBC, UA: NONE SEEN /hpf (ref 0–?)
Renal Epithel, UA: NONE SEEN /hpf
WBC, UA: NONE SEEN /hpf (ref 0–?)

## 2016-06-07 NOTE — Progress Notes (Signed)
Subjective:    Patient ID: Maurice Weaver, male    DOB: 1945-12-19, 71 y.o.   MRN: [id number]  HPI Pt here for follow up and management of chronic medical problems which includes hyperlipidemia and hypertension. He is taking medication regularly.The patient is doing well with no specific complaints today. He continues to be followed up by his audiologist because of his hearing deafness the history of cholesteatoma. He also has a history of melanoma. His vital signs are stable and his weight is stable from the previous visit. Patient is pleasant and alert and 101 he can understand visually and hear fairly well. He goes back to see the ear nose and throat specialist on an as needed basis and can call any time and make an appointment if an when he has continued problems with his years. He denies any chest pain or shortness of breath. He denies any problems with nausea or vomiting blood in the stool or black tarry bowel movements. He did have a colonoscopy almost a year ago and the gastroenterologist told him he did not need any more colonoscopies. Because of his history of loose bowel movements he told him to start taking any Imodium at bedtime and this has helped significantly with controlling his long history of loose bowel movements. He will continue to monitor for blood in the stool. He is doing well with passing his water with no burning pain or frequency. He is due to get an eye exam and he understands this.    Patient Active Problem List   Diagnosis Date Noted  . Thoracic aortic atherosclerosis ([place]) 10/06/2015  . Hiatal hernia 10/06/2015  . GERD (gastroesophageal reflux disease) 01/23/2013  . Insomnia 01/23/2013  . Melanoma in situ of cheek, history of 06/13/2012  . Hearing deficit 06/13/2012  . Hyperlipidemia 12/24/2008  . HYPERTENSION, BENIGN 12/24/2008  . Coronary atherosclerosis of native coronary artery 12/24/2008   Outpatient Encounter Prescriptions as of 06/07/2016  Medication  Sig  . aspirin 81 MG EC tablet Take 1 tablet (81 mg total) by mouth daily. Swallow whole.  . Cholecalciferol (VITAMIN D3) 5000 units CAPS Take 1 capsule by mouth daily.  Marland Kitchen ezetimibe (ZETIA) 10 MG tablet Take 1 tablet (10 mg total) by mouth at bedtime.  . hydrochlorothiazide (HYDRODIURIL) 25 MG tablet Take 1 tablet (25 mg total) by mouth daily.  . Loperamide HCl (IMODIUM A-D PO) Take 1 tablet by mouth daily as needed.  . metoprolol succinate (TOPROL XL) 100 MG 24 hr tablet Take 1 tablet (100 mg total) by mouth daily.  Marland Kitchen omeprazole (PRILOSEC) 40 MG capsule TAKE 1 CAPSULE  DAILY  . potassium chloride (K-DUR,KLOR-CON) 10 MEQ tablet Take 1 tablet (10 mEq total) by mouth daily.  . ramipril (ALTACE) 10 MG capsule Take 1 capsule (10 mg total) by mouth daily.  . rosuvastatin (CRESTOR) 20 MG tablet Take 1 tablet (20 mg total) by mouth at bedtime.   No facility-administered encounter medications on file as of 06/07/2016.       Review of Systems  Constitutional: Negative.   HENT: Negative.   Eyes: Negative.   Respiratory: Negative.   Cardiovascular: Negative.   Gastrointestinal: Negative.   Endocrine: Negative.   Genitourinary: Negative.   Musculoskeletal: Negative.   Skin: Negative.   Allergic/Immunologic: Negative.   Neurological: Negative.   Hematological: Negative.   Psychiatric/Behavioral: Negative.        Objective:   Physical Exam  Constitutional: He is oriented to person, place, and time. He appears well-developed  and well-nourished. No distress.  HENT:  Head: Normocephalic and atraumatic.  Mouth/Throat: Oropharynx is clear and moist. No oropharyngeal exudate.  There is slight nasal congestion bilaterally The patient wears bilateral hearing aids in both TMs are scarred because of cholesteatoma. He has no hearing on the right whatsoever and he has an amplification hearing aids at transfer from the right to the left.  Eyes: Conjunctivae and EOM are normal. Pupils are equal, round,  and reactive to light. Right eye exhibits no discharge. Left eye exhibits no discharge. No scleral icterus.  The patient understands that he needs to get an eye exam.  Neck: Normal range of motion. Neck supple. No thyromegaly present.  No bruits thyromegaly or anterior cervical adenopathy  Cardiovascular: Normal rate, regular rhythm, normal heart sounds and intact distal pulses.   No murmur heard. The heart has a regular rate and rhythm at 72/m  Pulmonary/Chest: Effort normal and breath sounds normal. No respiratory distress. He has no wheezes. He has no rales. He exhibits no tenderness.  No axillary adenopathy  Abdominal: Soft. Bowel sounds are normal. He exhibits no mass. There is no tenderness. There is no rebound and no guarding.  No abdominal tenderness masses or bruits or organ enlargement  Genitourinary: Rectum normal and penis normal.  Genitourinary Comments: Prostate is slightly enlarged and smooth to palpation. There were no rectal masses. There are no inguinal hernias. External genitalia were within normal limits.  Musculoskeletal: Normal range of motion. He exhibits no edema.  Lymphadenopathy:    He has no cervical adenopathy.  Neurological: He is alert and oriented to person, place, and time. He has normal reflexes. No cranial nerve deficit.  Skin: Skin is warm and dry. No rash noted.  Psychiatric: He has a normal mood and affect. His behavior is normal. Judgment and thought content normal.  Nursing note and vitals reviewed.  BP 112/72 (BP Location: Left Arm)   Pulse 69   Temp 97.2 F (36.2 C) (Oral)   Ht '5\' 10"'  (1.778 m)   Wt 186 lb (84.4 kg)   BMI 26.69 kg/m         Assessment & Plan:  1. HYPERTENSION, BENIGN -The blood pressures daily and he will continue with current treatment - CBC with Differential/Platelet - BMP8+EGFR - Hepatic function panel  2. Vitamin D deficiency -Continue with current treatment pending results of lab work - CBC with  Differential/Platelet - VITAMIN D 25 Hydroxy (Vit-D Deficiency, Fractures)  3. Gastroesophageal reflux disease, esophagitis presence not specified -The patient has no problems with this and no complaints with this currently. Will continue with his omeprazole. - CBC with Differential/Platelet - Hepatic function panel  4. Pure hypercholesterolemia -Continue with Crestor and aggressive therapeutic lifestyle changes pending results of lab work - CBC with Differential/Platelet - NMR, lipoprofile  5. Thoracic aortic atherosclerosis ([place]) -Continue with aggressive therapeutic lifestyle changes - CBC with Differential/Platelet - NMR, lipoprofile  6. Benign prostatic hyperplasia, unspecified whether lower urinary tract symptoms present -The prostate remains slightly enlarged and the patient is having no symptoms with this and there were no masses found on exam today. - CBC with Differential/Platelet - PSA, total and free - Urinalysis, Complete  7. Hearing deficit, bilateral -Continue to follow-up with the ear nose and throat specialist, Dr. Redmond Baseman  8. Melanoma of skin ([place]) -Continue to follow-up with dermatology  9. Other iron deficiency anemia -Check CBC  No orders of the defined types were placed in this encounter.  Patient Instructions  Medicare Annual Wellness Visit  [facility] and the medical providers at [facility] strive to bring you the best medical care.  In doing so we not only want to address your current medical conditions and concerns but also to detect new conditions early and prevent illness, disease and health-related problems.    Medicare offers a yearly Wellness Visit which allows our clinical staff to assess your need for preventative services including immunizations, lifestyle education, counseling to decrease risk of preventable diseases and screening for fall risk and other medical concerns.    This visit is provided  free of charge (no copay) for all Medicare recipients. The clinical pharmacists at [facility] have begun to conduct these Wellness Visits which will also include a thorough review of all your medications.    As you primary medical provider recommend that you make an appointment for your Annual Wellness Visit if you have not done so already this year.  You may set up this appointment before you leave today or you may call back ([phone]) and schedule an appointment.  Please make sure when you call that you mention that you are scheduling your Annual Wellness Visit with the clinical pharmacist so that the appointment may be made for the proper length of time.     Continue current medications. Continue good therapeutic lifestyle changes which include good diet and exercise. Fall precautions discussed with patient. If an FOBT was given today- please return it to our front desk. If you are over 46 years old - you may need Prevnar 10 or the adult Pneumonia vaccine.  **Flu shots are available--- please call and schedule a FLU-CLINIC appointment**  After your visit with [place] today you will receive a survey in the mail or online from [employer] regarding your care with [place]. Please take a moment to fill this out. Your feedback is very important to [place] as you can help [place] better understand your patient needs as well as improve your experience and satisfaction. WE CARE ABOUT YOU!!!   The patient will arrange for a visit to have his eyes checked. He will try some low sugar yogurt 2-3 times weekly to see if this helps with better bowel movement control. He should return the FOBT. He will get lab work today and we will call him with those results as soon as they become available in addition to the results of the urinalysis. He will continue to follow-up with his ear nose and throat specialist as needed.    Arrie Senate MD

## 2016-06-07 NOTE — Patient Instructions (Addendum)
Medicare Annual Wellness Visit  [facility] and the medical providers at [facility] strive to bring you the best medical care.  In doing so we not only want to address your current medical conditions and concerns but also to detect new conditions early and prevent illness, disease and health-related problems.    Medicare offers a yearly Wellness Visit which allows our clinical staff to assess your need for preventative services including immunizations, lifestyle education, counseling to decrease risk of preventable diseases and screening for fall risk and other medical concerns.    This visit is provided free of charge (no copay) for all Medicare recipients. The clinical pharmacists at [facility] have begun to conduct these Wellness Visits which will also include a thorough review of all your medications.    As you primary medical provider recommend that you make an appointment for your Annual Wellness Visit if you have not done so already this year.  You may set up this appointment before you leave today or you may call back ([phone]) and schedule an appointment.  Please make sure when you call that you mention that you are scheduling your Annual Wellness Visit with the clinical pharmacist so that the appointment may be made for the proper length of time.     Continue current medications. Continue good therapeutic lifestyle changes which include good diet and exercise. Fall precautions discussed with patient. If an FOBT was given today- please return it to our front desk. If you are over 68 years old - you may need Prevnar 1 or the adult Pneumonia vaccine.  **Flu shots are available--- please call and schedule a FLU-CLINIC appointment**  After your visit with [place] today you will receive a survey in the mail or online from [employer] regarding your care with [place]. Please take a moment to fill this out. Your feedback is very  important to [place] as you can help [place] better understand your patient needs as well as improve your experience and satisfaction. WE CARE ABOUT YOU!!!   The patient will arrange for a visit to have his eyes checked. He will try some low sugar yogurt 2-3 times weekly to see if this helps with better bowel movement control. He should return the FOBT. He will get lab work today and we will call him with those results as soon as they become available in addition to the results of the urinalysis. He will continue to follow-up with his ear nose and throat specialist as needed.

## 2016-06-07 NOTE — Addendum Note (Signed)
Addended by: Zannie Cove on: 06/07/2016 10:52 AM   Modules accepted: Orders

## 2016-06-08 ENCOUNTER — Other Ambulatory Visit: Payer: Medicare HMO

## 2016-06-08 DIAGNOSIS — Z1211 Encounter for screening for malignant neoplasm of colon: Secondary | ICD-10-CM

## 2016-06-08 LAB — CBC WITH DIFFERENTIAL/PLATELET
BASOS: 0 %
Basophils Absolute: 0 10*3/uL (ref 0.0–0.2)
EOS (ABSOLUTE): 0 10*3/uL (ref 0.0–0.4)
Eos: 0 %
Hematocrit: 46.9 % (ref 37.5–51.0)
Hemoglobin: 16.2 g/dL (ref 13.0–17.7)
IMMATURE GRANULOCYTES: 0 %
Immature Grans (Abs): 0 10*3/uL (ref 0.0–0.1)
LYMPHS ABS: 2.7 10*3/uL (ref 0.7–3.1)
Lymphs: 29 %
MCH: 32.9 pg (ref 26.6–33.0)
MCHC: 34.5 g/dL (ref 31.5–35.7)
MCV: 95 fL (ref 79–97)
MONOS ABS: 0.6 10*3/uL (ref 0.1–0.9)
Monocytes: 6 %
NEUTROS PCT: 65 %
Neutrophils Absolute: 5.9 10*3/uL (ref 1.4–7.0)
PLATELETS: 224 10*3/uL (ref 150–379)
RBC: 4.92 x10E6/uL (ref 4.14–5.80)
RDW: 13 % (ref 12.3–15.4)
WBC: 9.2 10*3/uL (ref 3.4–10.8)

## 2016-06-08 LAB — PSA, TOTAL AND FREE
PROSTATE SPECIFIC AG, SERUM: 2.3 ng/mL (ref 0.0–4.0)
PSA FREE: 0.66 ng/mL
PSA, Free Pct: 28.7 %

## 2016-06-08 LAB — NMR, LIPOPROFILE
Cholesterol: 146 mg/dL (ref 100–199)
HDL CHOLESTEROL BY NMR: 39 mg/dL — AB (ref >39)
HDL Particle Number: 31 umol/L (ref >=30.5)
LDL Particle Number: 1248 nmol/L — ABNORMAL HIGH (ref <1000)
LDL SIZE: 20.1 nm (ref >20.5)
LDL-C: 60 mg/dL (ref 0–99)
LP-IR Score: 77 — ABNORMAL HIGH (ref <=45)
SMALL LDL PARTICLE NUMBER: 760 nmol/L — AB (ref <=527)
Triglycerides by NMR: 234 mg/dL — ABNORMAL HIGH (ref 0–149)

## 2016-06-08 LAB — BMP8+EGFR
BUN/Creatinine Ratio: 16 (ref 10–24)
BUN: 15 mg/dL (ref 8–27)
CALCIUM: 10.2 mg/dL (ref 8.6–10.2)
CO2: 25 mmol/L (ref 18–29)
Chloride: 95 mmol/L — ABNORMAL LOW (ref 96–106)
Creatinine, Ser: 0.91 mg/dL (ref 0.76–1.27)
GFR calc non Af Amer: 84 mL/min/{1.73_m2} (ref >59)
GFR, EST AFRICAN AMERICAN: 98 mL/min/{1.73_m2} (ref >59)
Glucose: 93 mg/dL (ref 65–99)
POTASSIUM: 3.7 mmol/L (ref 3.5–5.2)
SODIUM: 137 mmol/L (ref 134–144)

## 2016-06-08 LAB — VITAMIN D 25 HYDROXY (VIT D DEFICIENCY, FRACTURES): Vit D, 25-Hydroxy: 35.4 ng/mL (ref 30.0–100.0)

## 2016-06-08 LAB — HEPATIC FUNCTION PANEL
ALBUMIN: 5 g/dL — AB (ref 3.5–4.8)
ALT: 19 IU/L (ref 0–44)
AST: 22 IU/L (ref 0–40)
Alkaline Phosphatase: 50 IU/L (ref 39–117)
Bilirubin Total: 0.9 mg/dL (ref 0.0–1.2)
Bilirubin, Direct: 0.21 mg/dL (ref 0.00–0.40)
TOTAL PROTEIN: 6.9 g/dL (ref 6.0–8.5)

## 2016-06-09 LAB — FECAL OCCULT BLOOD, IMMUNOCHEMICAL: FECAL OCCULT BLD: NEGATIVE

## 2016-07-12 NOTE — Progress Notes (Signed)
Cardiology Office Note   Date:  07/13/2016   ID:  BASHAR MILAM, DOB May 18, 1945, MRN [id number]  PCP:  Chipper Herb, MD  Cardiologist:   Minus Breeding, MD  Referring:  Chipper Herb, MD  Chief Complaint  Patient presents with  . Coronary Artery Disease      History of Present Illness: Maurice Weaver is a 71 y.o. male who presents for of CAD with a PCI in the late 90s by Dr. Olevia Perches.   In 2015 he had a POET (Plain Old Exercise Treadmill) which he passed without any evidence of ischemia.  Since I last saw him he has done well.  The patient denies any new symptoms such as chest discomfort, neck or arm discomfort. There has been no new shortness of breath, PND or orthopnea. There have been no reported palpitations, presyncope or syncope.  He walks the dog 3 miles per day.  He golfs.   Past Medical History:  Diagnosis Date  . AF (atrial fibrillation) ([place])   . Asthma    as a child. stable  . CAD (coronary artery disease)   . Cholesteatoma   . Decreased hearing   . Diverticulitis of colon    recurrent w/abscess - resected 2011  . Diverticulosis   . ED (erectile dysfunction)   . Fundic gland polyps of stomach, benign   . GERD (gastroesophageal reflux disease)   . Hiatal hernia   . Hyperlipidemia   . Hypertension   . Melanoma ([place]) 2013   Right Cheek  . MRSA (methicillin resistant staph aureus) culture positive 07/2009  . Nephrolithiasis   . S/P angioplasty   . Schatzki's ring   . Vitamin D deficiency     Past Surgical History:  Procedure Laterality Date  . COLON SURGERY  2011   diverticulitis left resection-T[place]  . COLONOSCOPY  Multiple  . CORONARY ANGIOPLASTY WITH STENT PLACEMENT  2000  . ESOPHAGOGASTRODUODENOSCOPY  Multiple  . INNER EAR SURGERY Bilateral 2015   x6  cholesteatomas  . MELANOMA EXCISION  2012  . TONSILLECTOMY AND ADENOIDECTOMY       Current Outpatient Prescriptions  Medication Sig Dispense Refill  . aspirin 81 MG EC tablet Take  1 tablet (81 mg total) by mouth daily. Swallow whole. 30 tablet 12  . Cholecalciferol (VITAMIN D3) 5000 units CAPS Take 1 capsule by mouth daily.    Marland Kitchen ezetimibe (ZETIA) 10 MG tablet Take 1 tablet (10 mg total) by mouth at bedtime. 90 tablet 1  . hydrochlorothiazide (HYDRODIURIL) 25 MG tablet Take 1 tablet (25 mg total) by mouth daily. 90 tablet 1  . Loperamide HCl (IMODIUM A-D PO) Take 1 tablet by mouth daily as needed.    . metoprolol succinate (TOPROL XL) 100 MG 24 hr tablet Take 1 tablet (100 mg total) by mouth daily. 90 tablet 1  . omeprazole (PRILOSEC) 40 MG capsule Take 40 mg by mouth daily.    . potassium chloride (K-DUR,KLOR-CON) 10 MEQ tablet Take 1 tablet (10 mEq total) by mouth daily. 90 tablet 1  . ramipril (ALTACE) 10 MG capsule Take 1 capsule (10 mg total) by mouth daily. 90 capsule 1  . rosuvastatin (CRESTOR) 20 MG tablet Take 1 tablet (20 mg total) by mouth at bedtime. 90 tablet 1   No current facility-administered medications for this visit.     Allergies:   Ampicillin and Erythromycin    ROS:  Please see the history of present illness.   Otherwise, review of systems  are positive for none.   All other systems are reviewed and negative.    PHYSICAL EXAM: VS:  BP 110/80   Pulse 73   Ht 5\' 11"  (1.803 m)   Wt 187 lb (84.8 kg)   BMI 26.08 kg/m  , BMI Body mass index is 26.08 kg/m.  GENERAL:  Well appearing NECK:  No jugular venous distention, waveform within normal limits, carotid upstroke brisk and symmetric, no bruits, no thyromegaly LUNGS:  Clear to auscultation bilaterally CHEST:  Unremarkable HEART:  PMI not displaced or sustained,S1 and S2 within normal limits, no S3, no S4, no clicks, no rubs, no murmurs ABD:  Flat, positive bowel sounds normal in frequency in pitch, no bruits, no rebound, no guarding, no midline pulsatile mass, no hepatomegaly, no splenomegaly EXT:  2 plus pulses throughout, no edema, no cyanosis no clubbing   EKG:  EKG is ordered today. The  ekg ordered today demonstrates sinus rhythm, rate 73, axis within normal limits, intervals within normal limits, no acute ST-T wave changes.   Recent Labs: 09/15/2015: TSH 1.920 06/07/2016: ALT 19; BUN 15; Creatinine, Ser 0.91; Platelets 224; Potassium 3.7; Sodium 137    Lipid Panel    Component Value Date/Time   CHOL 146 06/07/2016 1025   CHOL 120 06/13/2012 1011   TRIG 234 (H) 06/07/2016 1025   TRIG 111 06/13/2012 1011   HDL 39 (L) 06/07/2016 1025   HDL 35 (L) 06/13/2012 1011   LDLCALC 71 09/10/2013 0934   LDLCALC 63 06/13/2012 1011      Wt Readings from Last 3 Encounters:  07/13/16 187 lb (84.8 kg)  06/07/16 186 lb (84.4 kg)  01/27/16 186 lb (84.4 kg)      Other studies Reviewed: Additional studies/ records that were reviewed today include: labs. Review of the above records demonstrates:  Please see elsewhere in the note.     ASSESSMENT AND PLAN:    CAD: The patient has no new sypmtoms.  No further cardiovascular testing is indicated.  We will continue with aggressive risk reduction and meds as listed.  HTN:  The blood pressure is at target. No change in medications is indicated. We will continue with therapeutic lifestyle changes (TLC).  DYSLIPIDEMIA:  His LDL was 60 in April.  He will continue the meds as listed.    Current medicines are reviewed at length with the patient today.  The patient does not have concerns regarding medicines.  The following changes have been made:  no change  Labs/ tests ordered today include: None  Orders Placed This Encounter  Procedures  . EKG 12-Lead     Disposition:   FU with me in 2 years.     Signed, Minus Breeding, MD  07/13/2016 12:45 PM    [place]

## 2016-07-13 ENCOUNTER — Ambulatory Visit (INDEPENDENT_AMBULATORY_CARE_PROVIDER_SITE_OTHER): Payer: Medicare HMO | Admitting: Cardiology

## 2016-07-13 ENCOUNTER — Encounter: Payer: Self-pay | Admitting: Cardiology

## 2016-07-13 VITALS — BP 110/80 | HR 73 | Ht 71.0 in | Wt 187.0 lb

## 2016-07-13 DIAGNOSIS — I2510 Atherosclerotic heart disease of native coronary artery without angina pectoris: Secondary | ICD-10-CM

## 2016-07-13 DIAGNOSIS — I10 Essential (primary) hypertension: Secondary | ICD-10-CM | POA: Diagnosis not present

## 2016-07-13 NOTE — Patient Instructions (Signed)
Medication Instructions:  The current medical regimen is effective;  continue present plan and medications.  Follow-Up: Follow up in 2 years with Dr. Hochrein.  You will receive a letter in the mail 2 months before you are due.  Please call us when you receive this letter to schedule your follow up appointment.  If you need a refill on your cardiac medications before your next appointment, please call your pharmacy.  Thank you for choosing [facility]!!     

## 2016-08-08 ENCOUNTER — Other Ambulatory Visit: Payer: Self-pay | Admitting: Family Medicine

## 2016-08-25 ENCOUNTER — Other Ambulatory Visit: Payer: Self-pay | Admitting: Family Medicine

## 2016-09-06 ENCOUNTER — Encounter: Payer: Self-pay | Admitting: *Deleted

## 2016-10-12 ENCOUNTER — Other Ambulatory Visit: Payer: Self-pay | Admitting: Family Medicine

## 2016-10-24 ENCOUNTER — Ambulatory Visit: Payer: Medicare HMO | Admitting: Family Medicine

## 2016-10-25 ENCOUNTER — Ambulatory Visit: Payer: Medicare HMO | Admitting: Family Medicine

## 2016-11-09 ENCOUNTER — Ambulatory Visit (INDEPENDENT_AMBULATORY_CARE_PROVIDER_SITE_OTHER): Payer: Medicare HMO | Admitting: Family Medicine

## 2016-11-09 ENCOUNTER — Ambulatory Visit (INDEPENDENT_AMBULATORY_CARE_PROVIDER_SITE_OTHER): Payer: Medicare HMO

## 2016-11-09 ENCOUNTER — Encounter: Payer: Self-pay | Admitting: Family Medicine

## 2016-11-09 VITALS — BP 119/74 | HR 73 | Temp 97.3°F | Ht 71.0 in | Wt 188.0 lb

## 2016-11-09 DIAGNOSIS — N400 Enlarged prostate without lower urinary tract symptoms: Secondary | ICD-10-CM

## 2016-11-09 DIAGNOSIS — R0602 Shortness of breath: Secondary | ICD-10-CM

## 2016-11-09 DIAGNOSIS — E559 Vitamin D deficiency, unspecified: Secondary | ICD-10-CM

## 2016-11-09 DIAGNOSIS — I700 Atherosclerosis of aorta: Secondary | ICD-10-CM

## 2016-11-09 DIAGNOSIS — J301 Allergic rhinitis due to pollen: Secondary | ICD-10-CM

## 2016-11-09 DIAGNOSIS — K219 Gastro-esophageal reflux disease without esophagitis: Secondary | ICD-10-CM

## 2016-11-09 DIAGNOSIS — E7800 Pure hypercholesterolemia, unspecified: Secondary | ICD-10-CM

## 2016-11-09 DIAGNOSIS — I10 Essential (primary) hypertension: Secondary | ICD-10-CM | POA: Diagnosis not present

## 2016-11-09 MED ORDER — FLUTICASONE PROPIONATE 50 MCG/ACT NA SUSP: 2.0000 | Freq: Every day | NASAL | 6 refills | Status: DC

## 2016-11-09 MED ORDER — FLUTICASONE PROPIONATE 50 MCG/ACT NA SUSP: 2.0000 | Freq: Every day | NASAL | 3 refills | Status: AC

## 2016-11-09 MED ORDER — ALBUTEROL SULFATE HFA 108 (90 BASE) MCG/ACT IN AERS: 2.0000 | INHALATION_SPRAY | Freq: Four times a day (QID) | RESPIRATORY_TRACT | 3 refills | Status: DC | PRN

## 2016-11-09 MED ORDER — ALBUTEROL SULFATE HFA 108 (90 BASE) MCG/ACT IN AERS: 2.0000 | INHALATION_SPRAY | Freq: Four times a day (QID) | RESPIRATORY_TRACT | 1 refills | Status: DC | PRN

## 2016-11-09 NOTE — Addendum Note (Signed)
Addended by: Zannie Cove on: 11/09/2016 10:34 AM   Modules accepted: Orders

## 2016-11-09 NOTE — Progress Notes (Signed)
Subjective:    Patient ID: Maurice Weaver, male    DOB: [birth year], 71 y.o.   MRN: [id number]  HPI Pt here for follow up and management of chronic medical problems which includes hyperlipidemia and hypertension. He is taking medication regularly.The patient is doing well overall and complains today only of some shortness of breath. His vital signs are stable and is pulse ox was 98%. He will get lab work today. He needs no refills. The patient denies any chest pain. He says the shortness of breath seems to have gotten worse even noted sometimes when he is talking that he has to stop talking to get his breath. He denies any cough or actual wheezing. It may happen in the house and it may not happen in the house it may happen when he is out walking and it may not. He says he has a history of asthma as a child. He has used inhalers in the past. 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 He denies any trouble with his stomach nausea vomiting diarrhea blood in the stool or black tarry bowel movements. His colonoscopy is up-to-date and there is no family history of colon cancer. He's passing his water without problems. He does indicate that he knows that he is in need of an eye exam and he will arrange to do this.     Patient Active Problem List   Diagnosis Date Noted  . Thoracic aortic atherosclerosis ([facility]) 10/06/2015  . Hiatal hernia 10/06/2015  . GERD (gastroesophageal reflux disease) 01/23/2013  . Insomnia 01/23/2013  . Melanoma in situ of cheek, history of 06/13/2012  . Hearing deficit 06/13/2012  . Hyperlipidemia 12/24/2008  . HYPERTENSION, BENIGN 12/24/2008  . Coronary atherosclerosis of native coronary artery 12/24/2008   Outpatient Encounter Prescriptions as of 11/09/2016  Medication Sig  . aspirin 81 MG EC tablet Take 1 tablet (81 mg total) by mouth daily. Swallow whole.  . Cholecalciferol (VITAMIN D3) 5000 units CAPS Take 1 capsule by mouth daily.    Marland Kitchen ezetimibe (ZETIA) 10 MG tablet TAKE 1 TABLET AT BEDTIME  . hydrochlorothiazide (HYDRODIURIL) 25 MG tablet TAKE 1 TABLET EVERY DAY  . Loperamide HCl (IMODIUM A-D PO) Take 1 tablet by mouth daily as needed.  . metoprolol succinate (TOPROL-XL) 100 MG 24 hr tablet TAKE 1 TABLET EVERY DAY  . omeprazole (PRILOSEC) 40 MG capsule TAKE 1 CAPSULE EVERY DAY  . potassium chloride (K-DUR,KLOR-CON) 10 MEQ tablet TAKE 1 TABLET EVERY DAY  . ramipril (ALTACE) 10 MG capsule TAKE 1 CAPSULE EVERY DAY  . rosuvastatin (CRESTOR) 20 MG tablet TAKE 1 TABLET AT BEDTIME   No facility-administered encounter medications on file as of 11/09/2016.      Review of Systems  Constitutional: Negative.   HENT: Negative.   Eyes: Negative.   Respiratory: Positive for shortness of breath.   Cardiovascular: Negative.   Gastrointestinal: Negative.   Endocrine: Negative.   Genitourinary: Negative.   Musculoskeletal: Negative.   Skin: Negative.   Allergic/Immunologic: Negative.   Neurological: Negative.   Hematological: Negative.   Psychiatric/Behavioral: Negative.        Objective:   Physical Exam  Constitutional: He is oriented to person, place, and time. He appears well-developed and well-nourished. No distress.  The patient is pleasant and alert and doing well but is concerned about his increasing problems with shortness of breath  HENT:  Head: Normocephalic and atraumatic.  Mouth/Throat: Oropharynx is clear and moist. No oropharyngeal  exudate.  Patient is hearing deficit and his hearing aids transferred hearing from the right side to the left side. He is followed regularly by the ear nose and throat specialist. He also has nasal congestion and turbinate swelling bilaterally.  Eyes: Pupils are equal, round, and reactive to light. Conjunctivae and EOM are normal. Right eye exhibits no discharge. Left eye exhibits no discharge. No scleral icterus.  He understands the importance of of getting an eye exam and plans to  arrange for an eye visit.  Neck: Normal range of motion. Neck supple. No thyromegaly present.  No thyromegaly bruits or anterior cervical adenopathy  Cardiovascular: Normal rate, regular rhythm, normal heart sounds and intact distal pulses.   No murmur heard. The heart is regular at 72/m  Pulmonary/Chest: Effort normal and breath sounds normal. No respiratory distress. He has no wheezes. He has no rales. He exhibits no tenderness.  No axillary adenopathy. Clear anteriorly and posteriorly no congestion with coughing audible.  Abdominal: Soft. Bowel sounds are normal. He exhibits no mass. There is no tenderness. There is no rebound and no guarding.  No liver or spleen enlargement no bruits no masses and no inguinal adenopathy  Musculoskeletal: Normal range of motion. He exhibits no edema or tenderness.  Lymphadenopathy:    He has no cervical adenopathy.  Neurological: He is alert and oriented to person, place, and time. He has normal reflexes. No cranial nerve deficit.  Skin: Skin is warm and dry. No rash noted.  Psychiatric: He has a normal mood and affect. His behavior is normal. Judgment and thought content normal.  Nursing note and vitals reviewed.  BP 119/74 (BP Location: Left Arm)   Pulse 73   Temp (!) 97.3 F (36.3 C) (Oral)   Ht _0  (1.803 m)   Wt 188 lb (85.3 kg)   SpO2 98%   BMI 26.22 kg/m         Assessment & Plan:  1. HYPERTENSION, BENIGN -The blood pressure is good today and he will continue with current treatment - CBC with Differential/Platelet - BMP8+EGFR - Hepatic function panel  2. Vitamin D deficiency -Continue with current treatment pending results of lab work - CBC with Differential/Platelet - VITAMIN D 25 Hydroxy (Vit-D Deficiency, Fractures)  3. Gastroesophageal reflux disease, esophagitis presence not specified -The patient is having no symptoms of reflux and we'll continue with his omeprazole - CBC with Differential/Platelet - Hepatic function  panel  4. Pure hypercholesterolemia -Continue with his Zetia and Crestor and aggressive therapeutic lifestyle changes pending results of lab work - CBC with Differential/Platelet - Lipid panel  5. Thoracic aortic atherosclerosis (H[facility]) -Continue his statin drug and aggressive therapeutic lifestyle changes - CBC with Differential/Platelet  6. Benign prostatic hyperplasia, unspecified whether lower urinary tract symptoms present -No complaints or symptoms with his prostate today. - CBC with Differential/Platelet  7. Shortness of breath -Uncertain about the cause of this. He has a history of asthma as a child. It doesn't seem to be cardiac related and it may just be some random bronchospasm. We will call in a prescription for an albuterol inhaler which she will try at home and prior to walking to see if this makes any difference. We will get a chest x-ray and schedule a visit with the pulmonologist to further evaluate this shortness of breath that he is concerned about.  8. Seasonal allergic rhinitis due to pollen -Use Flonase as directed 2 sprays each nostril at bedtime after a week or so reduce it  to one spray each nostril at bedtime  Patient Instructions                       Medicare Annual Wellness Visit  [facility] and the medical providers at [facility] strive to bring you the best medical care.  In doing so we not only want to address your current medical conditions and concerns but also to detect new conditions early and prevent illness, disease and health-related problems.    Medicare offers a yearly Wellness Visit which allows our clinical staff to assess your need for preventative services including immunizations, lifestyle education, counseling to decrease risk of preventable diseases and screening for fall risk and other medical concerns.    This visit is provided free of charge (no copay) for all Medicare recipients. The clinical pharmacists at [facility] have begun to conduct these Wellness Visits which will also include a thorough review of all your medications.    As you primary medical provider recommend that you make an appointment for your Annual Wellness Visit if you have not done so already this year.  You may set up this appointment before you leave today or you may call back ([phone]) and schedule an appointment.  Please make sure when you call that you mention that you are scheduling your Annual Wellness Visit with the clinical pharmacist so that the appointment may be made for the proper length of time.     Continue current medications. Continue good therapeutic lifestyle changes which include good diet and exercise. Fall precautions discussed with patient. If an FOBT was given today- please return it to our front desk. If you are over 40 years old - you may need Prevnar 11 or the adult Pneumonia vaccine.  **Flu shots are available--- please call and schedule a FLU-CLINIC appointment**  After your visit with [place] today you will receive a survey in the mail or online from [employer] regarding your care with [place]. Please take a moment to fill this out. Your feedback is very important to [place] as you can help [place] better understand your patient needs as well as improve your experience and satisfaction. WE CARE ABOUT YOU!!!   We will call you in a prescription for an albuterol inhaler which can be used as needed and he should try this prior to seeing the pulmonologist and we will arrange for you to have a visit with. We will also get a chest x-ray we will call with the results of this as soon as it becomes available Try to leak your symptoms of shortness of breath with what you are doing so that you can relate this to 2 the pulmonologist when you see him.  Use Flonase 2 sprays each nostril nightly for at least a week and then cut it back to 1 spray each nostril.  Arrie Senate MD

## 2016-11-09 NOTE — Addendum Note (Signed)
Addended by: Zannie Cove on: 11/09/2016 11:12 AM   Modules accepted: Orders

## 2016-11-09 NOTE — Patient Instructions (Addendum)
Medicare Annual Wellness Visit  [facility] and the medical providers at [facility] strive to bring you the best medical care.  In doing so we not only want to address your current medical conditions and concerns but also to detect new conditions early and prevent illness, disease and health-related problems.    Medicare offers a yearly Wellness Visit which allows our clinical staff to assess your need for preventative services including immunizations, lifestyle education, counseling to decrease risk of preventable diseases and screening for fall risk and other medical concerns.    This visit is provided free of charge (no copay) for all Medicare recipients. The clinical pharmacists at [facility] have begun to conduct these Wellness Visits which will also include a thorough review of all your medications.    As you primary medical provider recommend that you make an appointment for your Annual Wellness Visit if you have not done so already this year.  You may set up this appointment before you leave today or you may call back ([phone]) and schedule an appointment.  Please make sure when you call that you mention that you are scheduling your Annual Wellness Visit with the clinical pharmacist so that the appointment may be made for the proper length of time.     Continue current medications. Continue good therapeutic lifestyle changes which include good diet and exercise. Fall precautions discussed with patient. If an FOBT was given today- please return it to our front desk. If you are over 47 years old - you may need Prevnar 80 or the adult Pneumonia vaccine.  **Flu shots are available--- please call and schedule a FLU-CLINIC appointment**  After your visit with [place] today you will receive a survey in the mail or online from [employer] regarding your care with [place]. Please take a moment to fill this out. Your feedback is very  important to [place] as you can help [place] better understand your patient needs as well as improve your experience and satisfaction. WE CARE ABOUT YOU!!!   We will call you in a prescription for an albuterol inhaler which can be used as needed and he should try this prior to seeing the pulmonologist and we will arrange for you to have a visit with. We will also get a chest x-ray we will call with the results of this as soon as it becomes available Try to leak your symptoms of shortness of breath with what you are doing so that you can relate this to 2 the pulmonologist when you see him.  Use Flonase 2 sprays each nostril nightly for at least a week and then cut it back to 1 spray each nostril.

## 2016-11-10 ENCOUNTER — Other Ambulatory Visit: Payer: Self-pay | Admitting: Family Medicine

## 2016-11-10 DIAGNOSIS — R0602 Shortness of breath: Secondary | ICD-10-CM

## 2016-11-10 LAB — BMP8+EGFR
BUN / CREAT RATIO: 17 (ref 10–24)
BUN: 16 mg/dL (ref 8–27)
CALCIUM: 10.3 mg/dL — AB (ref 8.6–10.2)
CHLORIDE: 97 mmol/L (ref 96–106)
CO2: 24 mmol/L (ref 20–29)
Creatinine, Ser: 0.96 mg/dL (ref 0.76–1.27)
GFR, EST AFRICAN AMERICAN: 92 mL/min/{1.73_m2} (ref >59)
GFR, EST NON AFRICAN AMERICAN: 79 mL/min/{1.73_m2} (ref >59)
Glucose: 94 mg/dL (ref 65–99)
Potassium: 3.5 mmol/L (ref 3.5–5.2)
Sodium: 139 mmol/L (ref 134–144)

## 2016-11-10 LAB — CBC WITH DIFFERENTIAL/PLATELET
BASOS: 0 %
Basophils Absolute: 0 10*3/uL (ref 0.0–0.2)
EOS (ABSOLUTE): 0 10*3/uL (ref 0.0–0.4)
EOS: 1 %
HEMATOCRIT: 45.3 % (ref 37.5–51.0)
HEMOGLOBIN: 15.7 g/dL (ref 13.0–17.7)
IMMATURE GRANULOCYTES: 0 %
Immature Grans (Abs): 0 10*3/uL (ref 0.0–0.1)
LYMPHS: 31 %
Lymphocytes Absolute: 2.3 10*3/uL (ref 0.7–3.1)
MCH: 31.7 pg (ref 26.6–33.0)
MCHC: 34.7 g/dL (ref 31.5–35.7)
MCV: 91 fL (ref 79–97)
MONOCYTES: 8 %
Monocytes Absolute: 0.6 10*3/uL (ref 0.1–0.9)
NEUTROS ABS: 4.4 10*3/uL (ref 1.4–7.0)
NEUTROS PCT: 60 %
Platelets: 233 10*3/uL (ref 150–379)
RBC: 4.96 x10E6/uL (ref 4.14–5.80)
RDW: 13.4 % (ref 12.3–15.4)
WBC: 7.3 10*3/uL (ref 3.4–10.8)

## 2016-11-10 LAB — LIPID PANEL
Chol/HDL Ratio: 3.3 ratio (ref 0.0–5.0)
Cholesterol, Total: 130 mg/dL (ref 100–199)
HDL: 39 mg/dL — AB (ref >39)
LDL CALC: 55 mg/dL (ref 0–99)
Triglycerides: 178 mg/dL — ABNORMAL HIGH (ref 0–149)
VLDL CHOLESTEROL CAL: 36 mg/dL (ref 5–40)

## 2016-11-10 LAB — HEPATIC FUNCTION PANEL
ALT: 19 IU/L (ref 0–44)
AST: 19 IU/L (ref 0–40)
Albumin: 4.8 g/dL (ref 3.5–4.8)
Alkaline Phosphatase: 48 IU/L (ref 39–117)
Bilirubin Total: 0.9 mg/dL (ref 0.0–1.2)
Bilirubin, Direct: 0.23 mg/dL (ref 0.00–0.40)
Total Protein: 7.2 g/dL (ref 6.0–8.5)

## 2016-11-10 LAB — VITAMIN D 25 HYDROXY (VIT D DEFICIENCY, FRACTURES): VIT D 25 HYDROXY: 36.5 ng/mL (ref 30.0–100.0)

## 2016-11-22 ENCOUNTER — Ambulatory Visit (INDEPENDENT_AMBULATORY_CARE_PROVIDER_SITE_OTHER): Payer: Medicare HMO | Admitting: *Deleted

## 2016-11-22 DIAGNOSIS — Z23 Encounter for immunization: Secondary | ICD-10-CM | POA: Diagnosis not present

## 2016-11-24 ENCOUNTER — Ambulatory Visit (HOSPITAL_COMMUNITY): Payer: Medicare HMO

## 2016-11-28 ENCOUNTER — Encounter: Payer: Self-pay | Admitting: Family Medicine

## 2016-11-28 ENCOUNTER — Ambulatory Visit (HOSPITAL_COMMUNITY)
Admission: RE | Admit: 2016-11-28 | Discharge: 2016-11-28 | Disposition: A | Discharge status: Home / Self Care | Payer: Medicare HMO | Source: Ambulatory Visit | Attending: Family Medicine | Admitting: Family Medicine

## 2016-11-28 DIAGNOSIS — K449 Diaphragmatic hernia without obstruction or gangrene: Secondary | ICD-10-CM | POA: Insufficient documentation

## 2016-11-28 DIAGNOSIS — R0602 Shortness of breath: Secondary | ICD-10-CM | POA: Insufficient documentation

## 2016-11-28 DIAGNOSIS — I700 Atherosclerosis of aorta: Secondary | ICD-10-CM | POA: Insufficient documentation

## 2016-11-28 MED ORDER — IOPAMIDOL (ISOVUE-370) INJECTION 76%
100.0000 mL | Freq: Once | INTRAVENOUS | Status: AC | PRN
  Administered 2016-11-28: 100 mL via INTRAVENOUS

## 2016-12-02 ENCOUNTER — Institutional Professional Consult (permissible substitution): Payer: Medicare HMO | Admitting: Pulmonary Disease

## 2016-12-21 ENCOUNTER — Other Ambulatory Visit: Payer: Self-pay | Admitting: Family Medicine

## 2016-12-26 ENCOUNTER — Other Ambulatory Visit (INDEPENDENT_AMBULATORY_CARE_PROVIDER_SITE_OTHER): Payer: Medicare HMO

## 2016-12-26 ENCOUNTER — Encounter: Payer: Self-pay | Admitting: Pulmonary Disease

## 2016-12-26 ENCOUNTER — Ambulatory Visit (INDEPENDENT_AMBULATORY_CARE_PROVIDER_SITE_OTHER): Payer: Medicare HMO | Admitting: Pulmonary Disease

## 2016-12-26 VITALS — HR 76 | Ht 72.0 in | Wt 192.0 lb

## 2016-12-26 DIAGNOSIS — J45909 Unspecified asthma, uncomplicated: Secondary | ICD-10-CM

## 2016-12-26 LAB — CBC WITH DIFFERENTIAL/PLATELET
BASOS PCT: 0.4 % (ref 0.0–3.0)
Basophils Absolute: 0 10*3/uL (ref 0.0–0.1)
EOS PCT: 0.4 % (ref 0.0–5.0)
Eosinophils Absolute: 0 10*3/uL (ref 0.0–0.7)
HCT: 48 % (ref 39.0–52.0)
HEMOGLOBIN: 16.2 g/dL (ref 13.0–17.0)
LYMPHS ABS: 1.8 10*3/uL (ref 0.7–4.0)
Lymphocytes Relative: 19.8 % (ref 12.0–46.0)
MCHC: 33.8 g/dL (ref 30.0–36.0)
MCV: 94.7 fl (ref 78.0–100.0)
MONO ABS: 0.5 10*3/uL (ref 0.1–1.0)
Monocytes Relative: 5.8 % (ref 3.0–12.0)
NEUTROS ABS: 6.6 10*3/uL (ref 1.4–7.7)
Neutrophils Relative %: 73.6 % (ref 43.0–77.0)
Platelets: 241 10*3/uL (ref 150.0–400.0)
RBC: 5.06 Mil/uL (ref 4.22–5.81)
RDW: 12.8 % (ref 11.5–15.5)
WBC: 9 10*3/uL (ref 4.0–10.5)

## 2016-12-26 LAB — POCT EXHALED NITRIC OXIDE: FeNO level (ppb): 16

## 2016-12-26 MED ORDER — BUDESONIDE-FORMOTEROL FUMARATE 160-4.5 MCG/ACT IN AERO: 2.0000 | INHALATION_SPRAY | Freq: Two times a day (BID) | RESPIRATORY_TRACT | 0 refills | Status: DC

## 2016-12-26 NOTE — Patient Instructions (Signed)
We will get pulmonary function test and FENO for better evaluation of your breathing Continue the albuterol inhaler.  We will start Symbicort 160/4.5 We will check CBC with differential and blood allergy profile, alpha-1 antitrypsin levels and phenotype Return to clinic in 1-2 months.

## 2016-12-26 NOTE — Progress Notes (Signed)
   Subjective:    Patient ID: Maurice Weaver, male    DOB: [birth year], 71 y.o.   MRN: [id number]  HPI    Review of Systems  Respiratory: Positive for chest tightness, shortness of breath and wheezing.   Allergic/Immunologic: Negative.        Objective:   Physical Exam        Assessment & Plan:

## 2016-12-26 NOTE — Progress Notes (Addendum)
Maurice Weaver    [id number]    1945-07-02  Primary Care Physician:Moore, Estella Husk, MD  Referring Physician: Chipper Herb, MD [address] [place], [place] [address]  Chief complaint: Consult for evaluation of COPD  HPI: 71 year old with history of coronary artery disease, hypertension, hyperlipidemia, atrial fibrillation, asthma.  He has complaints of dyspnea for the past 4 months.  He has symptoms with activity and at rest.  Denies cough, sputum production, fevers, chills, wheezing.  He likes to golf but has not done so in the past 2 months due to his dyspnea. Has history of asthma as a child but grew out of it.  He is currently on albuterol which he uses occasionally.  He is also on Prilosec for GERD. 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  Pets: Has a dog, no cats, birds Occupation: Retired.  Used to work as a Games developer at [employer] Exposures: Exposed to dust in his line of work.  Denies any other exposure Smoking history: 20-pack-year smoking history.  Quit in 2000 Travel History: Not significant  Outpatient Encounter Prescriptions as of 12/26/2016  Medication Sig  . albuterol (PROVENTIL HFA;VENTOLIN HFA) 108 (90 Base) MCG/ACT inhaler Inhale 2 puffs into the lungs every 6 (six) hours as needed for wheezing or shortness of breath.  Marland Kitchen aspirin 81 MG EC tablet Take 1 tablet (81 mg total) by mouth daily. Swallow whole.  . Cholecalciferol (VITAMIN D3) 5000 units CAPS Take 1 capsule by mouth daily.  Marland Kitchen ezetimibe (ZETIA) 10 MG tablet TAKE 1 TABLET AT BEDTIME  . fluticasone (FLONASE) 50 MCG/ACT nasal spray Place 2 sprays into both nostrils daily.  . hydrochlorothiazide (HYDRODIURIL) 25 MG tablet TAKE 1 TABLET EVERY DAY  . Loperamide HCl (IMODIUM A-D PO) Take 1 tablet by mouth daily as needed.  . metoprolol succinate (TOPROL-XL) 100 MG 24 hr tablet TAKE 1 TABLET EVERY DAY  . omeprazole (PRILOSEC) 40 MG capsule TAKE 1 CAPSULE EVERY DAY  . potassium  chloride (K-DUR,KLOR-CON) 10 MEQ tablet TAKE 1 TABLET EVERY DAY  . ramipril (ALTACE) 10 MG capsule TAKE 1 CAPSULE EVERY DAY  . rosuvastatin (CRESTOR) 20 MG tablet TAKE 1 TABLET AT BEDTIME   No facility-administered encounter medications on file as of 12/26/2016.     Allergies as of 12/26/2016 - Review Complete 11/09/2016  Allergen Reaction Noted  . Ampicillin Nausea Only 11/07/2011  . Erythromycin Other (See Comments) 06/13/2012    Past Medical History:  Diagnosis Date  . AF (atrial fibrillation) ([facility])   . Asthma    as a child. stable  . CAD (coronary artery disease)   . Cholesteatoma   . Decreased hearing   . Diverticulitis of colon    recurrent w/abscess - resected 2011  . Diverticulosis   . ED (erectile dysfunction)   . Fundic gland polyps of stomach, benign   . GERD (gastroesophageal reflux disease)   . Hiatal hernia   . Hyperlipidemia   . Hypertension   . Melanoma ([place]) 2013   Right Cheek  . MRSA (methicillin resistant staph aureus) culture positive 07/2009  . Nephrolithiasis   . S/P angioplasty   . Schatzki's ring   . Vitamin D deficiency     Past Surgical History:  Procedure Laterality Date  . COLON SURGERY  2011   diverticulitis left resection-Toth  . COLONOSCOPY  Multiple  . CORONARY ANGIOPLASTY WITH STENT PLACEMENT  2000  . ESOPHAGOGASTRODUODENOSCOPY  Multiple  . INNER  EAR SURGERY Bilateral 2015   x6  cholesteatomas  . MELANOMA EXCISION  2012  . TONSILLECTOMY AND ADENOIDECTOMY      Family History  Problem Relation Age of Onset  . Pancreatic cancer Mother   . Hypertension Mother   . Heart disease Father        Died of MI age 64  . Urolithiasis Brother   . Arthritis Brother   . Dementia Brother   . Alzheimer's disease Sister   . Colon cancer Neg Hx   . Esophageal cancer Neg Hx   . Rectal cancer Neg Hx   . Stomach cancer Neg Hx   . Prostate cancer Neg Hx     Social History   Social History  . Marital status: Divorced    Spouse name: N/A    . Number of children: 2  . Years of education: N/A   Occupational History  . Engineer, petroleum   Social History Main Topics  . Smoking status: Former Smoker    Packs/day: 0.50    Years: 30.00    Types: Cigarettes    Start date: 02/29/1960    Quit date: 04/29/1998  . Smokeless tobacco: Never Used     Comment: Patient quit "cold [place]" in 2000  . Alcohol use No  . Drug use: No  . Sexual activity: Not Currently   Other Topics Concern  . Not on file   Social History Narrative   Divorced and lives alone   Does have a step daughter in the area   Retired from [employer]   1 caffeine drinks daily   05/27/2015                Review of systems: Review of Systems  Constitutional: Negative for fever and chills.  HENT: Negative.   Eyes: Negative for blurred vision.  Respiratory: as per HPI  Cardiovascular: Negative for chest pain and palpitations.  Gastrointestinal: Negative for vomiting, diarrhea, blood per rectum. Genitourinary: Negative for dysuria, urgency, frequency and hematuria.  Musculoskeletal: Negative for myalgias, back pain and joint pain.  Skin: Negative for itching and rash.  Neurological: Negative for dizziness, tremors, focal weakness, seizures and loss of consciousness.  Endo/Heme/Allergies: Negative for environmental allergies.  Psychiatric/Behavioral: Negative for depression, suicidal ideas and hallucinations.  All other systems reviewed and are negative.  Physical Exam: Pulse 76, height 6' (1.829 m), weight 192 lb (87.1 kg), SpO2 97 %. Gen:      No acute distress HEENT:  EOMI, sclera anicteric Neck:     No masses; no thyromegaly Lungs:    Clear to auscultation bilaterally; normal respiratory effort CV:         Regular rate and rhythm; no murmurs Abd:      + bowel sounds; soft, non-tender; no palpable masses, no distension Ext:    No edema; adequate peripheral perfusion Skin:      Warm and dry; no rash Neuro: alert and oriented x  3 Psych: normal mood and affect  Data Reviewed: CTA chest 11/28/16-no evidence of pulmonary embolism, lung images are clear, large hiatal hernia and aortic atherosclerosis. I have reviewed all images personally  Assessment:  Evaluation for dyspnea Although he has history of childhood asthma symptoms now are not very typical of recurrence. He does have a history of smoking and may have emphysema and COPD We will evaluate by getting chest x-ray, FENO. Check lab work including CBC differential, blood allergy profile, alpha-1 antitrypsin levels and phenotype Continue on the albuterol.  We will start Symbicort 160/4.5  Plan/Recommendations: - PFTs, FENO - Continue albuterol, start Symbicort - Check CBC with differential, blood allergy profile, alpha-1 antitrypsin levels and phenotype  Marshell Garfinkel MD [facility] Pager [phone] 12/26/2016, 8:30 AM  CC: Chipper Herb, MD   Addendum  FENO 12/26/16- 16

## 2016-12-31 LAB — RESPIRATORY ALLERGY PROFILE REGION II ~~LOC~~
Allergen, A. alternata, m6: <0.1 kU/L
Allergen, Cottonwood, t14: <0.1 kU/L
Allergen, Mouse Urine Protein, e78: <0.1 kU/L
Allergen, Oak,t7: <0.1 kU/L
Aspergillus fumigatus, m3: <0.1 kU/L
Bermuda Grass: <0.1 kU/L
Box Elder IgE: <0.1 kU/L
CLASS: 0
CLASS: 0
CLASS: 0
CLASS: 0
CLASS: 0
CLASS: 0
CLASS: 0
CLASS: 0
CLASS: 0
CLASS: 0
Class: 0
Class: 0
Class: 0
Class: 0
Class: 0
Class: 0
Class: 0
Class: 0
Class: 0
Class: 0
Class: 0
Class: 0
Class: 0
Class: 0
Cockroach: <0.1 kU/L
D. farinae: <0.1 kU/L
Elm IgE: <0.1 kU/L
IGE (IMMUNOGLOBULIN E), SERUM: 10 kU/L (ref <=114)
Pecan/Hickory Tree IgE: <0.1 kU/L
Rough Pigweed  IgE: <0.1 kU/L
Sheep Sorrel IgE: <0.1 kU/L
Timothy Grass: <0.1 kU/L

## 2016-12-31 LAB — ALPHA-1 ANTITRYPSIN PHENOTYPE: A-1 Antitrypsin, Ser: 155 mg/dL (ref 83–199)

## 2016-12-31 LAB — INTERPRETATION:

## 2017-01-16 ENCOUNTER — Other Ambulatory Visit: Payer: Self-pay | Admitting: Family Medicine

## 2017-01-31 ENCOUNTER — Other Ambulatory Visit: Payer: Self-pay

## 2017-01-31 DIAGNOSIS — R0602 Shortness of breath: Secondary | ICD-10-CM

## 2017-02-01 ENCOUNTER — Ambulatory Visit (INDEPENDENT_AMBULATORY_CARE_PROVIDER_SITE_OTHER): Payer: Medicare HMO | Admitting: Pulmonary Disease

## 2017-02-01 ENCOUNTER — Ambulatory Visit: Payer: Medicare HMO | Admitting: Pulmonary Disease

## 2017-02-01 ENCOUNTER — Encounter: Payer: Self-pay | Admitting: Pulmonary Disease

## 2017-02-01 VITALS — BP 124/72 | HR 86 | Ht 70.0 in | Wt 190.0 lb

## 2017-02-01 DIAGNOSIS — R0602 Shortness of breath: Secondary | ICD-10-CM | POA: Diagnosis not present

## 2017-02-01 LAB — PULMONARY FUNCTION TEST
DL/VA % pred: 90 %
DL/VA: 4.16 ml/min/mmHg/L
DLCO unc % pred: 80 %
DLCO unc: 26.18 ml/min/mmHg
FEF 25-75 Post: 1.93 L/sec
FEF 25-75 Pre: 1.64 L/sec
FEF2575-%CHANGE-POST: 17 %
FEF2575-%PRED-POST: 81 %
FEF2575-%PRED-PRE: 69 %
FEV1-%Change-Post: 3 %
FEV1-%PRED-POST: 82 %
FEV1-%PRED-PRE: 79 %
FEV1-POST: 2.63 L
FEV1-PRE: 2.53 L
FEV1FVC-%CHANGE-POST: 0 %
FEV1FVC-%Pred-Pre: 98 %
FEV6-%Change-Post: 4 %
FEV6-%PRED-PRE: 85 %
FEV6-%Pred-Post: 89 %
FEV6-PRE: 3.5 L
FEV6-Post: 3.66 L
FEV6FVC-%Change-Post: 0 %
FEV6FVC-%Pred-Post: 106 %
FEV6FVC-%Pred-Pre: 105 %
FVC-%Change-Post: 4 %
FVC-%PRED-PRE: 80 %
FVC-%Pred-Post: 84 %
FVC-POST: 3.66 L
FVC-PRE: 3.51 L
POST FEV6/FVC RATIO: 100 %
PRE FEV1/FVC RATIO: 72 %
Post FEV1/FVC ratio: 72 %
Pre FEV6/FVC Ratio: 100 %

## 2017-02-01 LAB — NITRIC OXIDE: NITRIC OXIDE: 9

## 2017-02-01 NOTE — Patient Instructions (Signed)
Continue using the albuterol inhaler as needed You do not need to use the Symbicort as the lung function appears okay Continue to work on weight loss and exercise Follow-up in 6 months.

## 2017-02-01 NOTE — Progress Notes (Signed)
MANFORD SPRONG    [id number]    [birth year]  Primary Care Physician:Moore, Estella Husk, MD  Referring Physician: Chipper Herb, MD [address] [place], [place] [address]  Chief complaint: Follow up for dyspnea  HPI: 71 year old with history of coronary artery disease, hypertension, hyperlipidemia, atrial fibrillation, asthma.  He has complaints of dyspnea for the past 4 months.  He has symptoms with activity and at rest.  Denies cough, sputum production, fevers, chills, wheezing.  He likes to golf but has not done so in the past 2 months due to his dyspnea. Has history of asthma as a child but grew out of it.  He is currently on albuterol which he uses occasionally.  He is also on Prilosec for GERD. 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  Pets: Has a dog, no cats, birds Occupation: Retired.  Used to work as a Games developer at [employer] Exposures: Exposed to dust in his line of work.  Denies any other exposure Smoking history: 20-pack-year smoking history.  Quit in 2000 Travel History: Not significant  Interim History: Breathing is stable since last visit.  He has an occasional nonproductive cough, dyspnea with wheezing.  He was given samples of Symbicort at last visit.  He cannot tell if this is helping with his breathing.  Outpatient Encounter Medications as of 02/01/2017  Medication Sig  . albuterol (PROVENTIL HFA;VENTOLIN HFA) 108 (90 Base) MCG/ACT inhaler Inhale 2 puffs into the lungs every 6 (six) hours as needed for wheezing or shortness of breath.  Marland Kitchen aspirin 81 MG EC tablet Take 1 tablet (81 mg total) by mouth daily. Swallow whole.  . budesonide-formoterol (SYMBICORT) 160-4.5 MCG/ACT inhaler Inhale 2 puffs into the lungs 2 (two) times daily.  . Cholecalciferol (VITAMIN D3) 5000 units CAPS Take 1 capsule by mouth daily.  Marland Kitchen ezetimibe (ZETIA) 10 MG tablet TAKE 1 TABLET AT BEDTIME  . fluticasone (FLONASE) 50 MCG/ACT nasal spray Place 2 sprays into both  nostrils daily.  . hydrochlorothiazide (HYDRODIURIL) 25 MG tablet TAKE 1 TABLET EVERY DAY  . Loperamide HCl (IMODIUM A-D PO) Take 1 tablet by mouth daily as needed.  . metoprolol succinate (TOPROL-XL) 100 MG 24 hr tablet TAKE 1 TABLET EVERY DAY  . omeprazole (PRILOSEC) 40 MG capsule TAKE 1 CAPSULE EVERY DAY  . potassium chloride (K-DUR,KLOR-CON) 10 MEQ tablet TAKE 1 TABLET EVERY DAY  . ramipril (ALTACE) 10 MG capsule TAKE 1 CAPSULE EVERY DAY  . rosuvastatin (CRESTOR) 20 MG tablet TAKE 1 TABLET AT BEDTIME   No facility-administered encounter medications on file as of 02/01/2017.     Allergies as of 02/01/2017 - Review Complete 02/01/2017  Allergen Reaction Noted  . Ampicillin Nausea Only 11/07/2011  . Erythromycin Other (See Comments) 06/13/2012    Past Medical History:  Diagnosis Date  . AF (atrial fibrillation) ([place])   . Asthma    as a child. stable  . CAD (coronary artery disease)   . Cholesteatoma   . Decreased hearing   . Diverticulitis of colon    recurrent w/abscess - resected 2011  . Diverticulosis   . ED (erectile dysfunction)   . Fundic gland polyps of stomach, benign   . GERD (gastroesophageal reflux disease)   . Hiatal hernia   . Hyperlipidemia   . Hypertension   . Melanoma ([place]) 2013   Right Cheek  . MRSA (methicillin resistant staph aureus) culture positive 07/2009  . Nephrolithiasis   . S/P  angioplasty   . Schatzki's ring   . Vitamin D deficiency     Past Surgical History:  Procedure Laterality Date  . COLON SURGERY  2011   diverticulitis left resection-Toth  . COLONOSCOPY  Multiple  . CORONARY ANGIOPLASTY WITH STENT PLACEMENT  2000  . ESOPHAGOGASTRODUODENOSCOPY  Multiple  . INNER EAR SURGERY Bilateral 2015   x6  cholesteatomas  . MELANOMA EXCISION  2012  . TONSILLECTOMY AND ADENOIDECTOMY      Family History  Problem Relation Age of Onset  . Pancreatic cancer Mother   . Hypertension Mother   . Heart disease Father        Died of MI age 55  .  Urolithiasis Brother   . Arthritis Brother   . Dementia Brother   . Alzheimer's disease Sister   . Colon cancer Neg Hx   . Esophageal cancer Neg Hx   . Rectal cancer Neg Hx   . Stomach cancer Neg Hx   . Prostate cancer Neg Hx     Social History   Socioeconomic History  . Marital status: Divorced    Spouse name: Not on file  . Number of children: 2  . Years of education: Not on file  . Highest education level: Not on file  Social Needs  . Financial resource strain: Not on file  . Food insecurity - worry: Not on file  . Food insecurity - inability: Not on file  . Transportation needs - medical: Not on file  . Transportation needs - non-medical: Not on file  Occupational History  . Occupation: Arts development officer: [employer]  Tobacco Use  . Smoking status: Former Smoker    Packs/day: 0.50    Years: 30.00    Pack years: 15.00    Types: Cigarettes    Start date: 02/29/1960    Last attempt to quit: 04/29/1998    Years since quitting: 18.7  . Smokeless tobacco: Never Used  . Tobacco comment: Patient quit "cold [place]" in 2000  Substance and Sexual Activity  . Alcohol use: No    Alcohol/week: 0.0 oz  . Drug use: No  . Sexual activity: Not Currently  Other Topics Concern  . Not on file  Social History Narrative   Divorced and lives alone   Does have a step daughter in the area   Retired from [employer]   1 caffeine drinks daily   05/27/2015            Review of systems: Review of Systems  Constitutional: Negative for fever and chills.  HENT: Negative.   Eyes: Negative for blurred vision.  Respiratory: as per HPI  Cardiovascular: Negative for chest pain and palpitations.  Gastrointestinal: Negative for vomiting, diarrhea, blood per rectum. Genitourinary: Negative for dysuria, urgency, frequency and hematuria.  Musculoskeletal: Negative for myalgias, back pain and joint pain.  Skin: Negative for itching and rash.  Neurological: Negative for dizziness,  tremors, focal weakness, seizures and loss of consciousness.  Endo/Heme/Allergies: Negative for environmental allergies.  Psychiatric/Behavioral: Negative for depression, suicidal ideas and hallucinations.  All other systems reviewed and are negative.  Physical Exam: Blood pressure 124/72, pulse 86, height 5\' 10"  (1.778 m), weight 190 lb (86.2 kg), SpO2 100 %. Gen:      No acute distress HEENT:  EOMI, sclera anicteric Neck:     No masses; no thyromegaly Lungs:    Clear to auscultation bilaterally; normal respiratory effort CV:         Regular rate  and rhythm; no murmurs Abd:      + bowel sounds; soft, non-tender; no palpable masses, no distension Ext:    No edema; adequate peripheral perfusion Skin:      Warm and dry; no rash Neuro: alert and oriented x 3 Psych: normal mood and affect  Data Reviewed: CTA chest 11/28/16-no evidence of pulmonary embolism, lung images are clear, large hiatal hernia and aortic atherosclerosis. I have reviewed all images personally  CBC 12/26/16-WBC 9, eosinophils 0.4%, Blood allergy profile 12/26/16-RAST panel negative, IgE 10  A1AT 12/26/16- 155, PIMM  PFTs 02/01/17 FVC 3.66 (84%), FEV1 2.63 (82%), F/F 72, SVC 93%, DLCO 80% Minimal obstructive disease.  Unable to perform pleth  FENO 02/01/17-9  Assessment:  Evaluation for dyspnea Although he has history of childhood asthma symptoms now are not very typical of recurrence. PFTs reviewed with no obstruction.  Rest of the workup including CT, blood tests and FENO are normal.  I do not feel he will need to be on controller medication.  He will continue on albuterol as needed. I have asked him to stop the Symbicort.  He will continue to work on weight loss and exercise  Plan/Recommendations: - Albuterol as needed - Weight loss and exercise  Follow-up in 6 months.  Marshell Garfinkel MD [facility] Pager [phone] 02/01/2017, 12:25 PM  CC: Chipper Herb, MD   Addendum   Phylliss Blakes 12/26/16- 16

## 2017-02-01 NOTE — Progress Notes (Signed)
PFT done today. 

## 2017-02-02 DIAGNOSIS — H906 Mixed conductive and sensorineural hearing loss, bilateral: Secondary | ICD-10-CM | POA: Diagnosis not present

## 2017-02-02 DIAGNOSIS — H7192 Unspecified cholesteatoma, left ear: Secondary | ICD-10-CM | POA: Diagnosis not present

## 2017-02-27 ENCOUNTER — Other Ambulatory Visit: Payer: Self-pay | Admitting: Family Medicine

## 2017-03-29 ENCOUNTER — Ambulatory Visit: Payer: Medicare HMO | Admitting: Family Medicine

## 2017-04-05 ENCOUNTER — Encounter (INDEPENDENT_AMBULATORY_CARE_PROVIDER_SITE_OTHER): Payer: Self-pay

## 2017-04-05 ENCOUNTER — Encounter: Payer: Self-pay | Admitting: Family Medicine

## 2017-04-05 ENCOUNTER — Ambulatory Visit (INDEPENDENT_AMBULATORY_CARE_PROVIDER_SITE_OTHER): Payer: Medicare HMO | Admitting: Family Medicine

## 2017-04-05 VITALS — BP 120/71 | HR 73 | Temp 97.1°F | Ht 70.0 in | Wt 194.0 lb

## 2017-04-05 DIAGNOSIS — N400 Enlarged prostate without lower urinary tract symptoms: Secondary | ICD-10-CM | POA: Diagnosis not present

## 2017-04-05 DIAGNOSIS — D0339 Melanoma in situ of other parts of face: Secondary | ICD-10-CM | POA: Diagnosis not present

## 2017-04-05 DIAGNOSIS — E559 Vitamin D deficiency, unspecified: Secondary | ICD-10-CM

## 2017-04-05 DIAGNOSIS — I700 Atherosclerosis of aorta: Secondary | ICD-10-CM

## 2017-04-05 DIAGNOSIS — I2510 Atherosclerotic heart disease of native coronary artery without angina pectoris: Secondary | ICD-10-CM

## 2017-04-05 DIAGNOSIS — H9193 Unspecified hearing loss, bilateral: Secondary | ICD-10-CM | POA: Diagnosis not present

## 2017-04-05 DIAGNOSIS — K219 Gastro-esophageal reflux disease without esophagitis: Secondary | ICD-10-CM | POA: Diagnosis not present

## 2017-04-05 DIAGNOSIS — E7800 Pure hypercholesterolemia, unspecified: Secondary | ICD-10-CM | POA: Diagnosis not present

## 2017-04-05 DIAGNOSIS — I10 Essential (primary) hypertension: Secondary | ICD-10-CM | POA: Diagnosis not present

## 2017-04-05 DIAGNOSIS — R413 Other amnesia: Secondary | ICD-10-CM

## 2017-04-05 MED ORDER — DONEPEZIL HCL 5 MG PO TABS: 5.0000 mg | ORAL_TABLET | Freq: Every day | ORAL | 0 refills | Status: DC

## 2017-04-05 NOTE — Progress Notes (Signed)
Subjective:    Patient ID: Maurice Weaver, male    DOB: [birth year], 72 y.o.   MRN: [id number]  HPI Pt here for follow up and management of chronic medical problems which includes hyperlipidemia and hypertension. He is taking medication regularly.  Patient is doing well overall.  He is concerned about his decreased memory because of the family history of Alzheimer's.  He has been given a MMSE today and scored 26 out of 30.  The patient has no other complaints and will get lab work today.  The patient has a history of having a melanoma removed and has atherosclerosis of the aorta.  He also has a remote history of atrial fibrillation and atherosclerotic heart disease.  Patient denies any chest pain pressure tightness or shortness of breath.  He has no trouble with swallowing heartburn indigestion nausea vomiting diarrhea or blood in the stool.  He does have a history of frequent soft bowel movements but this is not been any different from the past.  He had a colonoscopy in May 2017.  He has no trouble passing his water.  He did tell me that his sister who is 54 years older than him had Alzheimer's type dementia and has passed away.  He has a brother that is older by a couple of years and he also has some memory issues.  We will go ahead today and start the patient on Aricept 5 mg once daily and gradually increase this to 10 and then add Namenda at a later time.  The MMSE that was done on him today is in the chart.   Patient Active Problem List   Diagnosis Date Noted  . Aortic atherosclerosis ([place] [facility]) 10/06/2015  . Hiatal hernia 10/06/2015  . GERD (gastroesophageal reflux disease) 01/23/2013  . Insomnia 01/23/2013  . Melanoma in situ of cheek, history of 06/13/2012  . Hearing deficit 06/13/2012  . Hyperlipidemia 12/24/2008  . HYPERTENSION, BENIGN 12/24/2008  . Coronary atherosclerosis of native coronary artery 12/24/2008   Outpatient Encounter Medications as of 04/05/2017  Medication Sig  .  albuterol (PROVENTIL HFA;VENTOLIN HFA) 108 (90 Base) MCG/ACT inhaler Inhale 2 puffs into the lungs every 6 (six) hours as needed for wheezing or shortness of breath.  Marland Kitchen aspirin 81 MG EC tablet Take 1 tablet (81 mg total) by mouth daily. Swallow whole.  . Cholecalciferol (VITAMIN D3) 5000 units CAPS Take 1 capsule by mouth daily.  Marland Kitchen ezetimibe (ZETIA) 10 MG tablet TAKE 1 TABLET AT BEDTIME  . fluticasone (FLONASE) 50 MCG/ACT nasal spray Place 2 sprays into both nostrils daily.  . hydrochlorothiazide (HYDRODIURIL) 25 MG tablet TAKE 1 TABLET EVERY DAY  . Loperamide HCl (IMODIUM A-D PO) Take 1 tablet by mouth daily as needed.  . metoprolol succinate (TOPROL-XL) 100 MG 24 hr tablet TAKE 1 TABLET EVERY DAY  . omeprazole (PRILOSEC) 40 MG capsule TAKE 1 CAPSULE EVERY DAY  . potassium chloride (K-DUR,KLOR-CON) 10 MEQ tablet TAKE 1 TABLET EVERY DAY  . ramipril (ALTACE) 10 MG capsule TAKE 1 CAPSULE EVERY DAY  . rosuvastatin (CRESTOR) 20 MG tablet TAKE 1 TABLET AT BEDTIME   No facility-administered encounter medications on file as of 04/05/2017.       Review of Systems  Constitutional: Negative.   HENT: Negative.   Eyes: Negative.   Respiratory: Negative.   Cardiovascular: Negative.   Gastrointestinal: Negative.   Endocrine: Negative.   Genitourinary: Negative.   Musculoskeletal: Negative.   Skin: Negative.   Allergic/Immunologic: Negative.   Neurological: Negative.  Hematological: Negative.   Psychiatric/Behavioral: Negative.        Decreased memory        Objective:   Physical Exam  Constitutional: He is oriented to person, place, and time. He appears well-developed and well-nourished.  The patient is calm pleasant and alert but obviously worsened hearing since the last visit   HENT:  Head: Normocephalic and atraumatic.  Nose: Nose normal.  Mouth/Throat: Oropharynx is clear and moist. No oropharyngeal exudate.  Slight nasal congestion bilaterally and patient is wearing bilateral  hearing aids with all the sound being transferred from the right side to the left side.  Eyes: Conjunctivae and EOM are normal. Pupils are equal, round, and reactive to light. Right eye exhibits no discharge. Left eye exhibits no discharge. No scleral icterus.  Neck: Normal range of motion. Neck supple. No thyromegaly present.  No bruits thyromegaly or anterior cervical adenopathy  Cardiovascular: Normal rate, regular rhythm, normal heart sounds and intact distal pulses.  No murmur heard. Heart has a regular rate and rhythm at 72/min  Pulmonary/Chest: Effort normal and breath sounds normal. No respiratory distress. He has no wheezes. He has no rales. He exhibits no tenderness.  Clear anteriorly and posteriorly and no axillary adenopathy  Abdominal: Soft. Bowel sounds are normal. He exhibits no mass. There is no tenderness. There is no rebound and no guarding.  No abdominal tenderness masses bruits organ enlargement or inguinal adenopathy with good inguinal pulses  Musculoskeletal: Normal range of motion. He exhibits no edema.  Lymphadenopathy:    He has no cervical adenopathy.  Neurological: He is alert and oriented to person, place, and time. He has normal reflexes. No cranial nerve deficit.  Diminished hearing bilaterally as already noted.  Reflexes on the right were slightly greater than those on the left in the lower extremity.  Skin: Skin is warm and dry. No rash noted.  Psychiatric: He has a normal mood and affect. His behavior is normal. Judgment and thought content normal.  Nursing note and vitals reviewed.  BP 120/71 (BP Location: Left Arm)   Pulse 73   Temp (!) 97.1 F (36.2 C) (Oral)   Ht _0  (1.778 m)   Wt 194 lb (88 kg)   BMI 27.84 kg/m         Assessment & Plan:  1. HYPERTENSION, BENIGN -The blood pressure is good today and he will continue with current treatment - BMP8+EGFR - CBC with Differential/Platelet - Hepatic function panel  2. Gastroesophageal reflux  disease, esophagitis presence not specified -Continue with omeprazole but we will encouraged the patient to try periodically some Zantac to see if this works as well. - CBC with Differential/Platelet - Hepatic function panel  3. Vitamin D deficiency -Continue vitamin D replacement pending results of lab work - CBC with Differential/Platelet - VITAMIN D 25 Hydroxy (Vit-D Deficiency, Fractures)  4. Pure hypercholesterolemia -Continue statin drug and current and aggressive therapeutic lifestyle changes pending results of lab work - CBC with Differential/Platelet - Lipid panel  5. Thoracic aortic atherosclerosis ([facility]) -Continue with statin therapy and aggressive therapeutic lifestyle changes - CBC with Differential/Platelet  6. Benign prostatic hyperplasia, unspecified whether lower urinary tract symptoms present -No complaints today with voiding - CBC with Differential/Platelet  7. Aortic atherosclerosis ([facility]) -Statin therapy along with aggressive therapeutic lifestyle changes  8. Hearing deficit, bilateral -Follow-up with audiologist and hearing specialist  9. Melanoma in situ of cheek, history of -Follow-up with dermatology as planned  10. Atherosclerosis of native coronary artery  of native heart without angina pectoris -Follow-up with cardiology as needed  11. Memory impairment of gradual onset -Start Aricept 5 mg daily and increase this to 10 mg in 1 month and gradually increase and add Namenda to Aricept and eventually get to a combination with namzeric -Patient scored 20 6 out of 10 today on the MMSE.  Patient Instructions                       Medicare Annual Wellness Visit  [facility] and the medical providers at [facility] strive to bring you the best medical care.  In doing so we not only want to address your current medical conditions and concerns but also to detect new conditions early and prevent illness, disease and health-related  problems.    Medicare offers a yearly Wellness Visit which allows our clinical staff to assess your need for preventative services including immunizations, lifestyle education, counseling to decrease risk of preventable diseases and screening for fall risk and other medical concerns.    This visit is provided free of charge (no copay) for all Medicare recipients. The clinical pharmacists at [facility] have begun to conduct these Wellness Visits which will also include a thorough review of all your medications.    As you primary medical provider recommend that you make an appointment for your Annual Wellness Visit if you have not done so already this year.  You may set up this appointment before you leave today or you may call back ([phone]) and schedule an appointment.  Please make sure when you call that you mention that you are scheduling your Annual Wellness Visit with the clinical pharmacist so that the appointment may be made for the proper length of time.     Continue current medications. Continue good therapeutic lifestyle changes which include good diet and exercise. Fall precautions discussed with patient. If an FOBT was given today- please return it to our front desk. If you are over 101 years old - you may need Prevnar 75 or the adult Pneumonia vaccine.  **Flu shots are available--- please call and schedule a FLU-CLINIC appointment**  After your visit with [place] today you will receive a survey in the mail or online from [employer] regarding your care with [place]. Please take a moment to fill this out. Your feedback is very important to [place] as you can help [place] better understand your patient needs as well as improve your experience and satisfaction. WE CARE ABOUT YOU!!!   Please schedule visit with Dr. Redmond Baseman and audiologist to make every attempt to upgrade your hearing devices to the best possible result We will start Aricept at 5 mg once daily and in 4 weeks we will ask  you to increase this to 10 mg as long as you tolerate the 5 mg without problems Down the road we will also add Namenda and eventually get you on a combination of Aricept and Namenda  Arrie Senate MD

## 2017-04-05 NOTE — Patient Instructions (Addendum)
Medicare Annual Wellness Visit  [facility] and the medical providers at [facility] strive to bring you the best medical care.  In doing so we not only want to address your current medical conditions and concerns but also to detect new conditions early and prevent illness, disease and health-related problems.    Medicare offers a yearly Wellness Visit which allows our clinical staff to assess your need for preventative services including immunizations, lifestyle education, counseling to decrease risk of preventable diseases and screening for fall risk and other medical concerns.    This visit is provided free of charge (no copay) for all Medicare recipients. The clinical pharmacists at [facility] have begun to conduct these Wellness Visits which will also include a thorough review of all your medications.    As you primary medical provider recommend that you make an appointment for your Annual Wellness Visit if you have not done so already this year.  You may set up this appointment before you leave today or you may call back ([phone]) and schedule an appointment.  Please make sure when you call that you mention that you are scheduling your Annual Wellness Visit with the clinical pharmacist so that the appointment may be made for the proper length of time.     Continue current medications. Continue good therapeutic lifestyle changes which include good diet and exercise. Fall precautions discussed with patient. If an FOBT was given today- please return it to our front desk. If you are over 55 years old - you may need Prevnar 64 or the adult Pneumonia vaccine.  **Flu shots are available--- please call and schedule a FLU-CLINIC appointment**  After your visit with [place] today you will receive a survey in the mail or online from [employer] regarding your care with [place]. Please take a moment to fill this out. Your feedback is very  important to [place] as you can help [place] better understand your patient needs as well as improve your experience and satisfaction. WE CARE ABOUT YOU!!!   Please schedule visit with Dr. Redmond Baseman and audiologist to make every attempt to upgrade your hearing devices to the best possible result We will start Aricept at 5 mg once daily and in 4 weeks we will ask you to increase this to 10 mg as long as you tolerate the 5 mg without problems Down the road we will also add Namenda and eventually get you on a combination of Aricept and Namenda

## 2017-04-06 LAB — CBC WITH DIFFERENTIAL/PLATELET
BASOS: 0 %
Basophils Absolute: 0 10*3/uL (ref 0.0–0.2)
EOS (ABSOLUTE): 0 10*3/uL (ref 0.0–0.4)
EOS: 0 %
HEMATOCRIT: 47.9 % (ref 37.5–51.0)
HEMOGLOBIN: 16 g/dL (ref 13.0–17.7)
Immature Grans (Abs): 0 10*3/uL (ref 0.0–0.1)
Immature Granulocytes: 1 %
Lymphocytes Absolute: 2.2 10*3/uL (ref 0.7–3.1)
Lymphs: 25 %
MCH: 31.3 pg (ref 26.6–33.0)
MCHC: 33.4 g/dL (ref 31.5–35.7)
MCV: 94 fL (ref 79–97)
MONOCYTES: 7 %
MONOS ABS: 0.6 10*3/uL (ref 0.1–0.9)
Neutrophils Absolute: 5.9 10*3/uL (ref 1.4–7.0)
Neutrophils: 67 %
Platelets: 238 10*3/uL (ref 150–379)
RBC: 5.11 x10E6/uL (ref 4.14–5.80)
RDW: 12.7 % (ref 12.3–15.4)
WBC: 8.8 10*3/uL (ref 3.4–10.8)

## 2017-04-06 LAB — BMP8+EGFR
BUN / CREAT RATIO: 19 (ref 10–24)
BUN: 18 mg/dL (ref 8–27)
CO2: 24 mmol/L (ref 20–29)
CREATININE: 0.96 mg/dL (ref 0.76–1.27)
Calcium: 10 mg/dL (ref 8.6–10.2)
Chloride: 98 mmol/L (ref 96–106)
GFR, EST AFRICAN AMERICAN: 91 mL/min/{1.73_m2} (ref >59)
GFR, EST NON AFRICAN AMERICAN: 79 mL/min/{1.73_m2} (ref >59)
GLUCOSE: 101 mg/dL — AB (ref 65–99)
Potassium: 3.7 mmol/L (ref 3.5–5.2)
SODIUM: 139 mmol/L (ref 134–144)

## 2017-04-06 LAB — LIPID PANEL
CHOL/HDL RATIO: 3.3 ratio (ref 0.0–5.0)
Cholesterol, Total: 133 mg/dL (ref 100–199)
HDL: 40 mg/dL (ref >39)
LDL CALC: 58 mg/dL (ref 0–99)
TRIGLYCERIDES: 176 mg/dL — AB (ref 0–149)
VLDL Cholesterol Cal: 35 mg/dL (ref 5–40)

## 2017-04-06 LAB — HEPATIC FUNCTION PANEL
ALT: 19 IU/L (ref 0–44)
AST: 18 IU/L (ref 0–40)
Albumin: 5 g/dL — ABNORMAL HIGH (ref 3.5–4.8)
Alkaline Phosphatase: 50 IU/L (ref 39–117)
Bilirubin Total: 1 mg/dL (ref 0.0–1.2)
Bilirubin, Direct: 0.24 mg/dL (ref 0.00–0.40)
Total Protein: 7.3 g/dL (ref 6.0–8.5)

## 2017-04-06 LAB — VITAMIN D 25 HYDROXY (VIT D DEFICIENCY, FRACTURES): Vit D, 25-Hydroxy: 23.8 ng/mL — ABNORMAL LOW (ref 30.0–100.0)

## 2017-04-13 ENCOUNTER — Ambulatory Visit (INDEPENDENT_AMBULATORY_CARE_PROVIDER_SITE_OTHER): Payer: Medicare HMO | Admitting: *Deleted

## 2017-04-13 VITALS — BP 121/73 | HR 78 | Temp 97.2°F | Ht 70.0 in | Wt 194.0 lb

## 2017-04-13 DIAGNOSIS — Z0000 Encounter for general adult medical examination without abnormal findings: Secondary | ICD-10-CM

## 2017-04-13 NOTE — Progress Notes (Signed)
Subjective:   Maurice Weaver is a 72 y.o. male who presents for Medicare Annual/Subsequent preventive examination. He is retired from [employer]. He enjoys playing golf and he walks regularly for exercise. He states he eats a healthy diet and gets in 2-3 meals a day. He attends church occasionally, but not regularly. He lives at home alone and he does have 1 small dog. He has 2 step-children that live local here in town and they check on him frequently. Fall hazards and risks were discussed today. He states that his health is about the same as it was a year ago.    Cardiac Risk Factors include: advanced age (>66men, >21 women);male gender     Objective:    Vitals: BP 121/73 (BP Location: Left Arm)   Pulse 78   Temp (!) 97.2 F (36.2 C) (Oral)   Ht 5\' 10"  (1.778 m)   Wt 194 lb (88 kg)   BMI 27.84 kg/m   Body mass index is 27.84 kg/m.  Advanced Directives 04/13/2017 09/03/2015 06/23/2015 06/23/2015 05/27/2014  Does Patient Have a Medical Advance Directive? Yes Yes - Yes Yes  Type of Advance Directive Living will [place];Living will Healthcare Power of Attorney Living will Living will  Does patient want to make changes to medical advance directive? - No - Patient declined - No - Patient declined No - Patient declined  Copy of [place] in Chart? - No - copy requested - No - copy requested Yes    Tobacco Social History   Tobacco Use  Smoking Status Former Smoker  . Packs/day: 0.50  . Years: 30.00  . Pack years: 15.00  . Types: Cigarettes  . Start date: 02/29/1960  . Last attempt to quit: 04/29/1998  . Years since quitting: 18.9  Smokeless Tobacco Never Used  Tobacco Comment   Patient quit "cold [place]" in 2000     Counseling given: Not Answered Comment: Patient quit "cold [place]" in 2000   Clinical Intake:                       Past Medical History:  Diagnosis Date  . AF (atrial fibrillation) ([place])   . Asthma    as  a child. stable  . CAD (coronary artery disease)   . Cholesteatoma   . Decreased hearing   . Diverticulitis of colon    recurrent w/abscess - resected 2011  . Diverticulosis   . ED (erectile dysfunction)   . Fundic gland polyps of stomach, benign   . GERD (gastroesophageal reflux disease)   . Hiatal hernia   . Hyperlipidemia   . Hypertension   . Melanoma ([place]) 2013   Right Cheek  . MRSA (methicillin resistant staph aureus) culture positive 07/2009  . Nephrolithiasis   . S/P angioplasty   . Schatzki's ring   . Vitamin D deficiency    Past Surgical History:  Procedure Laterality Date  . COLON SURGERY  2011   diverticulitis left resection-Toth  . COLONOSCOPY  Multiple  . CORONARY ANGIOPLASTY WITH STENT PLACEMENT  2000  . ESOPHAGOGASTRODUODENOSCOPY  Multiple  . INNER EAR SURGERY Bilateral 2015   x6  cholesteatomas  . MELANOMA EXCISION  2012  . TONSILLECTOMY AND ADENOIDECTOMY     Family History  Problem Relation Age of Onset  . Pancreatic cancer Mother   . Hypertension Mother   . Heart disease Father        Died of MI age 88  .  Heart attack Father   . Urolithiasis Brother   . Arthritis Brother   . Dementia Brother   . Alzheimer's disease Sister   . Colon cancer Neg Hx   . Esophageal cancer Neg Hx   . Rectal cancer Neg Hx   . Stomach cancer Neg Hx   . Prostate cancer Neg Hx    Social History   Socioeconomic History  . Marital status: Divorced    Spouse name: None  . Number of children: 2  . Years of education: None  . Highest education level: None  Social Needs  . Financial resource strain: None  . Food insecurity - worry: None  . Food insecurity - inability: None  . Transportation needs - medical: None  . Transportation needs - non-medical: None  Occupational History  . Occupation: Arts development officer: [employer]  Tobacco Use  . Smoking status: Former Smoker    Packs/day: 0.50    Years: 30.00    Pack years: 15.00    Types: Cigarettes     Start date: 02/29/1960    Last attempt to quit: 04/29/1998    Years since quitting: 18.9  . Smokeless tobacco: Never Used  . Tobacco comment: Patient quit "cold [place]" in 2000  Substance and Sexual Activity  . Alcohol use: No    Alcohol/week: 0.0 oz  . Drug use: No  . Sexual activity: Not Currently  Other Topics Concern  . None  Social History Narrative   Divorced and lives alone   Does have a step daughter in the area   Retired from [employer]   1 caffeine drinks daily   05/27/2015             Outpatient Encounter Medications as of 04/13/2017  Medication Sig  . albuterol (PROVENTIL HFA;VENTOLIN HFA) 108 (90 Base) MCG/ACT inhaler Inhale 2 puffs into the lungs every 6 (six) hours as needed for wheezing or shortness of breath.  Marland Kitchen aspirin 81 MG EC tablet Take 1 tablet (81 mg total) by mouth daily. Swallow whole.  . Cholecalciferol (VITAMIN D3) 5000 units CAPS Take 1 capsule by mouth daily.  Marland Kitchen donepezil (ARICEPT) 5 MG tablet Take 1 tablet (5 mg total) by mouth at bedtime.  Marland Kitchen ezetimibe (ZETIA) 10 MG tablet TAKE 1 TABLET AT BEDTIME  . fluticasone (FLONASE) 50 MCG/ACT nasal spray Place 2 sprays into both nostrils daily.  . hydrochlorothiazide (HYDRODIURIL) 25 MG tablet TAKE 1 TABLET EVERY DAY  . Loperamide HCl (IMODIUM A-D PO) Take 1 tablet by mouth daily as needed.  . metoprolol succinate (TOPROL-XL) 100 MG 24 hr tablet TAKE 1 TABLET EVERY DAY  . omeprazole (PRILOSEC) 40 MG capsule TAKE 1 CAPSULE EVERY DAY  . potassium chloride (K-DUR,KLOR-CON) 10 MEQ tablet TAKE 1 TABLET EVERY DAY  . ramipril (ALTACE) 10 MG capsule TAKE 1 CAPSULE EVERY DAY  . rosuvastatin (CRESTOR) 20 MG tablet TAKE 1 TABLET AT BEDTIME   No facility-administered encounter medications on file as of 04/13/2017.     Activities of Daily Living In your present state of health, do you have any difficulty performing the following activities: 04/13/2017  Hearing? Y  Vision? Y  Comment reading   Difficulty  concentrating or making decisions? Y  Comment some - new onset memory issues   Walking or climbing stairs? N  Dressing or bathing? N  Doing errands, shopping? N  Preparing Food and eating ? N  Using the Toilet? N  In the past six months, have you accidently  leaked urine? N  Do you have problems with loss of bowel control? N  Managing your Medications? N  Managing your Finances? N  Housekeeping or managing your Housekeeping? N  Some recent data might be hidden    Patient Care Team: Chipper Herb, MD as PCP - General (Family Medicine) Gatha Mayer, MD as Attending Physician (Gastroenterology) Melida Quitter, MD as Consulting Physician (Otolaryngology) Minus Breeding, MD as Consulting Physician (Cardiology)   Assessment:   This is a routine wellness examination for Raphael.  Exercise Activities and Dietary recommendations Current Exercise Habits: Home exercise routine, Type of exercise: walking, Frequency (Times/Week): 4, Intensity: Mild, Exercise limited by: None identified  Goals    . Increase physical activity     Keep walking    . Prevent falls       Fall Risk Fall Risk  04/13/2017 04/05/2017 11/09/2016 06/07/2016 01/27/2016  Falls in the past year? No No No No No  Number falls in past yr: - - - - -  Injury with Fall? - - - - -   Is the patient's home free of loose throw rugs in walkways, pet beds, electrical cords, etc?  Fall risk and hazards were discussed today.    Depression Screen PHQ 2/9 Scores 04/13/2017 04/05/2017 11/09/2016 06/07/2016  PHQ - 2 Score 0 0 0 0    Cognitive Function MMSE - Mini Mental State Exam 04/13/2017 04/05/2017 09/03/2015 05/27/2014  Not completed: - - - Refused  Orientation to time 4 4 5 5   Orientation to Place 5 5 5 5   Registration 3 3 3 1   Attention/ Calculation 5 5 5 4   Recall 0 0 0 3  Language- name 2 objects 2 2 2 2   Language- repeat 1 1 1 1   Language- follow 3 step command 3 3 2 3   Language- read & follow direction 1 1 1 1   Write a  sentence 1 1 1 1   Copy design 1 1 1 1   Total score 26 26 26 27         Immunization History  Administered Date(s) Administered  . Influenza, High Dose Seasonal PF 12/29/2014, 01/27/2016, 11/22/2016  . Influenza,inj,quad, With Preservative 01/02/2014  . Influenza-Unspecified 01/23/2013  . Pneumococcal Conjugate-13 01/23/2013  . Pneumococcal Polysaccharide-23 08/29/2010  . Tdap 06/28/2008  . Zoster 11/28/2005    Qualifies for Shingles Vaccine? He has already had the zostavax and prefers to wait on the new shingrix.   Screening Tests Health Maintenance  Topic Date Due  . Hepatitis C Screening  06/07/2021 (Originally [birth year])  . COLON CANCER SCREENING ANNUAL FOBT  06/08/2017  . TETANUS/TDAP  06/29/2018  . INFLUENZA VACCINE  Completed  . PNA vac Low Risk Adult  Completed   Cancer Screenings: Lung: Low Dose CT Chest recommended if Age 31-80 years, 30 pack-year currently smoking OR have quit w/in 15years. Patient does qualify. Colorectal: done 4/18- does yearly.  Additional Screenings:  Hepatitis B/HIV/Syphillis: Hepatitis C Screening:    denied  Plan:   he will keep follow up with Dr Laurance Flatten and other specialist.  He will bring in a copy of his advanced directives.  He has been having some recent dizziness - this will be discussed with Dr Laurance Flatten.  I have personally reviewed and noted the following in the patient's chart:   . Medical and social history . Use of alcohol, tobacco or illicit drugs  . Current medications and supplements . Functional ability and status . Nutritional status . Physical activity . Advanced directives .  List of other physicians . Hospitalizations, surgeries, and ER visits in previous 12 months . Vitals . Screenings to include cognitive, depression, and falls . Referrals and appointments  In addition, I have reviewed and discussed with patient certain preventive protocols, quality metrics, and best practice recommendations. A written  personalized care plan for preventive services as well as general preventive health recommendations were provided to patient.     Bullins, Cameron Proud, LPN  09/22/2033  I have reviewed and agree with the above AWV documentation.   Mary-Margaret Hassell Done, FNP

## 2017-04-13 NOTE — Patient Instructions (Addendum)
  Mr. Rosencrans , Thank you for taking time to come for your Medicare Wellness Visit. I appreciate your ongoing commitment to your health goals. Please review the following plan we discussed and let me know if I can assist you in the future.   These are the goals we discussed: Goals    . Increase physical activity     Keep walking    . Prevent falls       This is a list of the screening recommended for you and due dates:  Health Maintenance  Topic Date Due  .  Hepatitis C: One time screening is recommended by Center for Disease Control  (CDC) for  adults born from [birth year] through 1966.   06/07/2021*  . Stool Blood Test  06/08/2017  . Tetanus Vaccine  06/29/2018  . Flu Shot  Completed  . Pneumonia vaccines  Completed  *Topic was postponed. The date shown is not the original due date.     Keep follow up with Dr Laurance Flatten and other specialist  Continue monitoring the dizziness - if it doesn't subside soon - we will talk to Dr Laurance Flatten about a change in the medicine. Bring in a copy of your advanced directives / living will.

## 2017-05-01 ENCOUNTER — Other Ambulatory Visit: Payer: Self-pay | Admitting: *Deleted

## 2017-05-01 MED ORDER — DONEPEZIL HCL 5 MG PO TABS: 5.0000 mg | ORAL_TABLET | Freq: Every day | ORAL | 1 refills | Status: DC

## 2017-05-01 MED ORDER — DONEPEZIL HCL 5 MG PO TABS: 5.0000 mg | ORAL_TABLET | Freq: Every day | ORAL | 0 refills | Status: DC

## 2017-05-03 ENCOUNTER — Other Ambulatory Visit: Payer: Self-pay | Admitting: Family Medicine

## 2017-05-25 DIAGNOSIS — H7192 Unspecified cholesteatoma, left ear: Secondary | ICD-10-CM | POA: Diagnosis not present

## 2017-05-25 DIAGNOSIS — H906 Mixed conductive and sensorineural hearing loss, bilateral: Secondary | ICD-10-CM | POA: Diagnosis not present

## 2017-05-31 ENCOUNTER — Other Ambulatory Visit: Payer: Self-pay | Admitting: Family Medicine

## 2017-06-01 ENCOUNTER — Other Ambulatory Visit: Payer: Self-pay | Admitting: Otolaryngology

## 2017-06-01 DIAGNOSIS — H7192 Unspecified cholesteatoma, left ear: Secondary | ICD-10-CM

## 2017-06-03 ENCOUNTER — Ambulatory Visit
Admission: RE | Admit: 2017-06-03 | Discharge: 2017-06-03 | Disposition: A | Discharge status: Home / Self Care | Payer: Medicare HMO | Source: Ambulatory Visit | Attending: Otolaryngology | Admitting: Otolaryngology

## 2017-06-03 ENCOUNTER — Other Ambulatory Visit: Payer: Medicare HMO

## 2017-06-03 DIAGNOSIS — H7192 Unspecified cholesteatoma, left ear: Secondary | ICD-10-CM | POA: Diagnosis not present

## 2017-06-09 DIAGNOSIS — W57XXXA Bitten or stung by nonvenomous insect and other nonvenomous arthropods, initial encounter: Secondary | ICD-10-CM | POA: Diagnosis not present

## 2017-06-09 DIAGNOSIS — S70362A Insect bite (nonvenomous), left thigh, initial encounter: Secondary | ICD-10-CM | POA: Diagnosis not present

## 2017-06-19 ENCOUNTER — Telehealth: Payer: Self-pay | Admitting: *Deleted

## 2017-06-19 MED ORDER — MEMANTINE HCL 5 MG PO TABS: 5.0000 mg | ORAL_TABLET | Freq: Two times a day (BID) | ORAL | 1 refills | Status: DC

## 2017-06-19 NOTE — Telephone Encounter (Signed)
Could try Namenda with an increased dosing regimen.

## 2017-06-19 NOTE — Telephone Encounter (Signed)
Baxter Flattery aware of change and med sen to in

## 2017-06-19 NOTE — Telephone Encounter (Signed)
Please call Baxter Flattery with advice.

## 2017-07-05 ENCOUNTER — Other Ambulatory Visit: Payer: Self-pay | Admitting: Family Medicine

## 2017-07-17 ENCOUNTER — Other Ambulatory Visit: Payer: Self-pay | Admitting: Family Medicine

## 2017-07-27 DIAGNOSIS — H906 Mixed conductive and sensorineural hearing loss, bilateral: Secondary | ICD-10-CM | POA: Diagnosis not present

## 2017-07-27 DIAGNOSIS — H9390 Unspecified disorder of ear, unspecified ear: Secondary | ICD-10-CM | POA: Diagnosis not present

## 2017-07-27 DIAGNOSIS — H7192 Unspecified cholesteatoma, left ear: Secondary | ICD-10-CM | POA: Diagnosis not present

## 2017-07-27 DIAGNOSIS — H9191 Unspecified hearing loss, right ear: Secondary | ICD-10-CM | POA: Diagnosis not present

## 2017-07-27 DIAGNOSIS — H9212 Otorrhea, left ear: Secondary | ICD-10-CM | POA: Diagnosis not present

## 2017-08-07 ENCOUNTER — Other Ambulatory Visit: Payer: Self-pay | Admitting: Family Medicine

## 2017-08-09 ENCOUNTER — Ambulatory Visit (INDEPENDENT_AMBULATORY_CARE_PROVIDER_SITE_OTHER): Payer: Medicare HMO | Admitting: Family Medicine

## 2017-08-09 ENCOUNTER — Encounter: Payer: Self-pay | Admitting: Family Medicine

## 2017-08-09 VITALS — BP 136/74 | HR 78 | Temp 97.3°F | Ht 70.0 in | Wt 194.0 lb

## 2017-08-09 DIAGNOSIS — I700 Atherosclerosis of aorta: Secondary | ICD-10-CM

## 2017-08-09 DIAGNOSIS — I10 Essential (primary) hypertension: Secondary | ICD-10-CM

## 2017-08-09 DIAGNOSIS — K219 Gastro-esophageal reflux disease without esophagitis: Secondary | ICD-10-CM | POA: Diagnosis not present

## 2017-08-09 DIAGNOSIS — E7800 Pure hypercholesterolemia, unspecified: Secondary | ICD-10-CM

## 2017-08-09 DIAGNOSIS — N400 Enlarged prostate without lower urinary tract symptoms: Secondary | ICD-10-CM | POA: Diagnosis not present

## 2017-08-09 DIAGNOSIS — E559 Vitamin D deficiency, unspecified: Secondary | ICD-10-CM

## 2017-08-09 DIAGNOSIS — Z0100 Encounter for examination of eyes and vision without abnormal findings: Secondary | ICD-10-CM | POA: Diagnosis not present

## 2017-08-09 DIAGNOSIS — Z1283 Encounter for screening for malignant neoplasm of skin: Secondary | ICD-10-CM | POA: Diagnosis not present

## 2017-08-09 DIAGNOSIS — H5200 Hypermetropia, unspecified eye: Secondary | ICD-10-CM | POA: Diagnosis not present

## 2017-08-09 LAB — URINALYSIS, COMPLETE
Bilirubin, UA: NEGATIVE
Glucose, UA: NEGATIVE
KETONES UA: NEGATIVE
LEUKOCYTES UA: NEGATIVE
NITRITE UA: NEGATIVE
RBC, UA: NEGATIVE
SPEC GRAV UA: 1.02 (ref 1.005–1.030)
Urobilinogen, Ur: 0.2 mg/dL (ref 0.2–1.0)
pH, UA: 5.5 (ref 5.0–7.5)

## 2017-08-09 LAB — MICROSCOPIC EXAMINATION
Bacteria, UA: NONE SEEN
EPITHELIAL CELLS (NON RENAL): NONE SEEN /HPF (ref 0–10)
RENAL EPITHEL UA: NONE SEEN /HPF
WBC UA: NONE SEEN /HPF (ref 0–5)

## 2017-08-09 NOTE — Patient Instructions (Addendum)
Medicare Annual Wellness Visit  [facility] and the medical providers at [facility] strive to bring you the best medical care.  In doing so we not only want to address your current medical conditions and concerns but also to detect new conditions early and prevent illness, disease and health-related problems.    Medicare offers a yearly Wellness Visit which allows our clinical staff to assess your need for preventative services including immunizations, lifestyle education, counseling to decrease risk of preventable diseases and screening for fall risk and other medical concerns.    This visit is provided free of charge (no copay) for all Medicare recipients. The clinical pharmacists at [facility] have begun to conduct these Wellness Visits which will also include a thorough review of all your medications.    As you primary medical provider recommend that you make an appointment for your Annual Wellness Visit if you have not done so already this year.  You may set up this appointment before you leave today or you may call back ([phone]) and schedule an appointment.  Please make sure when you call that you mention that you are scheduling your Annual Wellness Visit with the clinical pharmacist so that the appointment may be made for the proper length of time.     Continue current medications. Continue good therapeutic lifestyle changes which include good diet and exercise. Fall precautions discussed with patient. If an FOBT was given today- please return it to our front desk. If you are over 52 years old - you may need Prevnar 29 or the adult Pneumonia vaccine.  **Flu shots are available--- please call and schedule a FLU-CLINIC appointment**  After your visit with [place] today you will receive a survey in the mail or online from [employer] regarding your care with [place]. Please take a moment to fill this out. Your feedback is very  important to [place] as you can help [place] better understand your patient needs as well as improve your experience and satisfaction. WE CARE ABOUT YOU!!!   We will schedule this patient for a routine follow-up with the cardiologist because of increased risk factors and heart disease and we will asked that he see him every 2 to 3 years periodically. We also reminded the patient that when he gets a call from the gastroenterologist and they decided not to do any further colonoscopies that he could consider doing the Cologuard test. He should stay active physically and eat healthy and follow-up with his ear nose and throat specialist regularly.

## 2017-08-09 NOTE — Progress Notes (Signed)
Subjective:    Patient ID: Maurice Weaver, male    DOB: 04-18-1945, 72 y.o.   MRN: [id number]  HPI Pt here for follow up and management of chronic medical problems which includes hypertension and hyperlipidemia. He is taking medication regularly.  The patient is doing well with no specific complaints today.  He is due to get a urine rectal exam and lab work today.  He is followed regularly by his ear nose and throat specialist because of his history of cholesteatoma and hearing loss.  His BMI is good at 27.84.  His family history is positive in his mother for hypertension and pancreatic cancer.  His father had dementia and heart disease.  He also had a sister that had Alzheimer's disease and she has passed away.  He has 1 brother and 1 sister living and dementia is also positive and his brother who has arthritis.  She today is pleasant and obviously still having hearing issues despite his hearing aid devices.  He denies any chest pain pressure tightness or shortness of breath.  He denies any trouble with swallowing heartburn indigestion nausea vomiting or constipation.  He does occasionally have some softer loose bowel movements but this appears to be normal for him.  He has not seen any blood in the stool.  He is passing his water without problems.  He does have some reactive airways disease and finds that he needs to use his albuterol inhaler prior to walking and exercise and as needed.  We did discuss using Mucinex for cough and congestion and I gave him a handout sheet regarding the Mucinex.  He plans to schedule himself for an eye exam soon and will make sure that we get a copy of this report.  He also will need a periodic follow-up with the cardiologist because of his multiple risk factor problems for heart disease.    Patient Active Problem List   Diagnosis Date Noted  . Aortic atherosclerosis ([place]) 10/06/2015  . Hiatal hernia 10/06/2015  . GERD (gastroesophageal reflux disease) 01/23/2013   . Insomnia 01/23/2013  . Melanoma in situ of cheek, history of 06/13/2012  . Hearing deficit 06/13/2012  . Hyperlipidemia 12/24/2008  . HYPERTENSION, BENIGN 12/24/2008  . Coronary atherosclerosis of native coronary artery 12/24/2008   Outpatient Encounter Medications as of 08/09/2017  Medication Sig  . albuterol (PROVENTIL HFA;VENTOLIN HFA) 108 (90 Base) MCG/ACT inhaler INHALE 2 PUFFS INTO THE LUNGS EVERY 6 (SIX) HOURS AS NEEDED FOR WHEEZING OR SHORTNESS OF BREATH.  Marland Kitchen aspirin 81 MG EC tablet Take 1 tablet (81 mg total) by mouth daily. Swallow whole.  . Cholecalciferol (VITAMIN D3) 5000 units CAPS Take 1 capsule by mouth daily.  Marland Kitchen ezetimibe (ZETIA) 10 MG tablet TAKE 1 TABLET AT BEDTIME  . fluticasone (FLONASE) 50 MCG/ACT nasal spray Place 2 sprays into both nostrils daily.  . hydrochlorothiazide (HYDRODIURIL) 25 MG tablet TAKE 1 TABLET EVERY DAY  . Loperamide HCl (IMODIUM A-D PO) Take 1 tablet by mouth daily as needed.  . memantine (NAMENDA) 5 MG tablet Take 1 tablet (5 mg total) by mouth 2 (two) times daily.  . metoprolol succinate (TOPROL-XL) 100 MG 24 hr tablet TAKE 1 TABLET EVERY DAY  . omeprazole (PRILOSEC) 40 MG capsule TAKE 1 CAPSULE EVERY DAY  . potassium chloride (K-DUR,KLOR-CON) 10 MEQ tablet TAKE 1 TABLET EVERY DAY  . ramipril (ALTACE) 10 MG capsule TAKE 1 CAPSULE EVERY DAY  . rosuvastatin (CRESTOR) 20 MG tablet TAKE 1 TABLET AT BEDTIME  No facility-administered encounter medications on file as of 08/09/2017.      Review of Systems  Constitutional: Negative.   HENT: Negative.   Eyes: Negative.   Respiratory: Negative.   Cardiovascular: Negative.   Gastrointestinal: Negative.   Endocrine: Negative.   Genitourinary: Negative.   Musculoskeletal: Negative.   Skin: Negative.   Allergic/Immunologic: Negative.   Neurological: Negative.   Hematological: Negative.   Psychiatric/Behavioral: Negative.        Objective:   Physical Exam  Constitutional: He is oriented to  person, place, and time. He appears well-developed and well-nourished. No distress.  The patient is pleasant and relaxed  HENT:  Head: Normocephalic and atraumatic.  Right Ear: External ear normal.  Nose: Nose normal.  Mouth/Throat: Oropharynx is clear and moist. No oropharyngeal exudate.  Patient wears bilateral hearing aid in his left ear canal which he does not hear from has debris and congestion but with his hearing devices which can place from the other ear he is hearing is stable.  Hearing impairment with all the hearing going to 1 ear from the other ear with hearing aids.  He sees Dr. Redmond Baseman regularly.  Eyes: Pupils are equal, round, and reactive to light. Conjunctivae and EOM are normal. Right eye exhibits no discharge. Left eye exhibits no discharge. No scleral icterus.  He is getting ready to schedule himself for an eye exam and will make sure we get a copy of the report  Neck: Normal range of motion. Neck supple. No thyromegaly present.  No bruits thyromegaly or anterior cervical adenopathy  Cardiovascular: Normal rate, regular rhythm, normal heart sounds and intact distal pulses.  No murmur heard. Heart is regular at 72/min  Pulmonary/Chest: Effort normal and breath sounds normal. He has no wheezes. He has no rales. He exhibits no tenderness.  Clear anteriorly and posteriorly and no axillary adenopathy or chest wall masses.  Abdominal: Soft. Bowel sounds are normal. He exhibits no mass. There is no tenderness. There is no guarding. No hernia.  No abdominal tenderness masses bruits or inguinal adenopathy  Genitourinary: Rectum normal, prostate normal and penis normal.  Genitourinary Comments: The prostate is enlarged with the right side being larger than the left but with no lumps or masses.  There were no rectal masses.  External genitalia were within normal limits with normal testicles and no masses.  Musculoskeletal: Normal range of motion. He exhibits no edema.  Lymphadenopathy:      He has no cervical adenopathy.  Neurological: He is alert and oriented to person, place, and time. He has normal reflexes. No cranial nerve deficit.  Skin: Skin is warm and dry. No rash noted.  The patient does have a warty type growth on the dorsal aspect of the right hand which he has seen about from the dermatologist.  Psychiatric: He has a normal mood and affect. His behavior is normal. Judgment and thought content normal.  The patient is pleasant and relaxed and his biggest issues have to do with  Nursing note and vitals reviewed.   BP 136/74 (BP Location: Left Arm)   Pulse 78   Temp (!) 97.3 F (36.3 C) (Oral)   Ht '5\' 10"'  (1.778 m)   Wt 194 lb (88 kg)   BMI 27.84 kg/m        Assessment & Plan:  1. HYPERTENSION, BENIGN -Blood pressure is good today and he will continue with current treatment and lifestyle management with exercise and watching sodium intake - BMP8+EGFR - CBC with Differential/Platelet -  Hepatic function panel - BMP8+EGFR - CBC with Differential/Platelet - Hepatic function panel - Ambulatory referral to Cardiology  2. Gastroesophageal reflux disease, esophagitis presence not specified -Patient is stable with his reflux issues unless he eats something out of the ordinary. - CBC with Differential/Platelet - Hepatic function panel - CBC with Differential/Platelet  3. Vitamin D deficiency -Continue with vitamin D replacement pending results of lab work - CBC with Differential/Platelet - VITAMIN D 25 Hydroxy (Vit-D Deficiency, Fractures) - CBC with Differential/Platelet - VITAMIN D 25 Hydroxy (Vit-D Deficiency, Fractures)  4. Pure hypercholesterolemia -Continue with aggressive therapeutic lifestyle changes and current cholesterol meds pending results of lab work - CBC with Differential/Platelet - Lipid panel - CBC with Differential/Platelet - Lipid panel - Ambulatory referral to Cardiology  5. Benign prostatic hyperplasia, unspecified whether  lower urinary tract symptoms present -The prostate remains enlarged but patient is having no issues with voiding. - CBC with Differential/Platelet - Lipid panel - PSA, total and free - Urinalysis, Complete - CBC with Differential/Platelet - Urinalysis, Complete - PSA, total and free  6. Thoracic aortic atherosclerosis ([place]) -Continue with aggressive therapeutic lifestyle changes and statin therapy - CBC with Differential/Platelet - Hepatic function panel - CBC with Differential/Platelet - Ambulatory referral to Cardiology  7. Aortic atherosclerosis ([place]) -As above with periodic visits to see cardiology. - CBC with Differential/Platelet - Lipid panel - CBC with Differential/Platelet - Ambulatory referral to Cardiology  8. Screening for skin cancer - Ambulatory referral to Dermatology  No orders of the defined types were placed in this encounter.  Patient Instructions                       Medicare Annual Wellness Visit  [facility] and the medical providers at [facility] strive to bring you the best medical care.  In doing so we not only want to address your current medical conditions and concerns but also to detect new conditions early and prevent illness, disease and health-related problems.    Medicare offers a yearly Wellness Visit which allows our clinical staff to assess your need for preventative services including immunizations, lifestyle education, counseling to decrease risk of preventable diseases and screening for fall risk and other medical concerns.    This visit is provided free of charge (no copay) for all Medicare recipients. The clinical pharmacists at [facility] have begun to conduct these Wellness Visits which will also include a thorough review of all your medications.    As you primary medical provider recommend that you make an appointment for your Annual Wellness Visit if you have not done so already this  year.  You may set up this appointment before you leave today or you may call back ([phone]) and schedule an appointment.  Please make sure when you call that you mention that you are scheduling your Annual Wellness Visit with the clinical pharmacist so that the appointment may be made for the proper length of time.     Continue current medications. Continue good therapeutic lifestyle changes which include good diet and exercise. Fall precautions discussed with patient. If an FOBT was given today- please return it to our front desk. If you are over 6 years old - you may need Prevnar 18 or the adult Pneumonia vaccine.  **Flu shots are available--- please call and schedule a FLU-CLINIC appointment**  After your visit with [place] today you will receive a survey in the mail or online from [employer]  regarding your care with [place]. Please take a moment to fill this out. Your feedback is very important to [place] as you can help [place] better understand your patient needs as well as improve your experience and satisfaction. WE CARE ABOUT YOU!!!   We will schedule this patient for a routine follow-up with the cardiologist because of increased risk factors and heart disease and we will asked that he see him every 2 to 3 years periodically. We also reminded the patient that when he gets a call from the gastroenterologist and they decided not to do any further colonoscopies that he could consider doing the Cologuard test. He should stay active physically and eat healthy and follow-up with his ear nose and throat specialist regularly.  Arrie Senate MD

## 2017-08-10 ENCOUNTER — Other Ambulatory Visit: Payer: Self-pay | Admitting: *Deleted

## 2017-08-10 LAB — PSA, TOTAL AND FREE
PROSTATE SPECIFIC AG, SERUM: 2.5 ng/mL (ref 0.0–4.0)
PSA, Free Pct: 29.6 %
PSA, Free: 0.74 ng/mL

## 2017-08-10 LAB — CBC WITH DIFFERENTIAL/PLATELET
BASOS: 0 %
Basophils Absolute: 0 10*3/uL (ref 0.0–0.2)
EOS (ABSOLUTE): 0 10*3/uL (ref 0.0–0.4)
EOS: 1 %
HEMATOCRIT: 47.2 % (ref 37.5–51.0)
HEMOGLOBIN: 16 g/dL (ref 13.0–17.7)
IMMATURE GRANS (ABS): 0 10*3/uL (ref 0.0–0.1)
IMMATURE GRANULOCYTES: 0 %
LYMPHS: 30 %
Lymphocytes Absolute: 2 10*3/uL (ref 0.7–3.1)
MCH: 32.1 pg (ref 26.6–33.0)
MCHC: 33.9 g/dL (ref 31.5–35.7)
MCV: 95 fL (ref 79–97)
MONOCYTES: 7 %
Monocytes Absolute: 0.5 10*3/uL (ref 0.1–0.9)
NEUTROS PCT: 62 %
Neutrophils Absolute: 4.1 10*3/uL (ref 1.4–7.0)
Platelets: 225 10*3/uL (ref 150–450)
RBC: 4.99 x10E6/uL (ref 4.14–5.80)
RDW: 13 % (ref 12.3–15.4)
WBC: 6.7 10*3/uL (ref 3.4–10.8)

## 2017-08-10 LAB — LIPID PANEL
CHOL/HDL RATIO: 3.1 ratio (ref 0.0–5.0)
Cholesterol, Total: 139 mg/dL (ref 100–199)
HDL: 45 mg/dL (ref >39)
LDL Calculated: 63 mg/dL (ref 0–99)
Triglycerides: 153 mg/dL — ABNORMAL HIGH (ref 0–149)
VLDL CHOLESTEROL CAL: 31 mg/dL (ref 5–40)

## 2017-08-10 LAB — HEPATIC FUNCTION PANEL
ALT: 18 IU/L (ref 0–44)
AST: 17 IU/L (ref 0–40)
Albumin: 4.6 g/dL (ref 3.5–4.8)
Alkaline Phosphatase: 50 IU/L (ref 39–117)
BILIRUBIN, DIRECT: 0.22 mg/dL (ref 0.00–0.40)
Bilirubin Total: 0.8 mg/dL (ref 0.0–1.2)
TOTAL PROTEIN: 7.1 g/dL (ref 6.0–8.5)

## 2017-08-10 LAB — BMP8+EGFR
BUN / CREAT RATIO: 12 (ref 10–24)
BUN: 13 mg/dL (ref 8–27)
CHLORIDE: 100 mmol/L (ref 96–106)
CO2: 24 mmol/L (ref 20–29)
Calcium: 10.4 mg/dL — ABNORMAL HIGH (ref 8.6–10.2)
Creatinine, Ser: 1.07 mg/dL (ref 0.76–1.27)
GFR calc non Af Amer: 69 mL/min/{1.73_m2} (ref >59)
GFR, EST AFRICAN AMERICAN: 80 mL/min/{1.73_m2} (ref >59)
Glucose: 98 mg/dL (ref 65–99)
POTASSIUM: 3.7 mmol/L (ref 3.5–5.2)
SODIUM: 139 mmol/L (ref 134–144)

## 2017-08-10 LAB — VITAMIN D 25 HYDROXY (VIT D DEFICIENCY, FRACTURES): VIT D 25 HYDROXY: 22 ng/mL — AB (ref 30.0–100.0)

## 2017-08-10 MED ORDER — VITAMIN D (ERGOCALCIFEROL) 1.25 MG (50000 UNIT) PO CAPS: 50000.0000 [IU] | ORAL_CAPSULE | ORAL | 3 refills | Status: DC

## 2017-08-17 ENCOUNTER — Other Ambulatory Visit: Payer: Self-pay | Admitting: *Deleted

## 2017-08-17 ENCOUNTER — Other Ambulatory Visit: Payer: Medicare HMO

## 2017-08-17 DIAGNOSIS — Z129 Encounter for screening for malignant neoplasm, site unspecified: Secondary | ICD-10-CM

## 2017-08-18 LAB — FECAL OCCULT BLOOD, IMMUNOCHEMICAL: Fecal Occult Bld: NEGATIVE

## 2017-08-30 ENCOUNTER — Other Ambulatory Visit: Payer: Self-pay | Admitting: *Deleted

## 2017-08-30 MED ORDER — MEMANTINE HCL 5 MG PO TABS: 5.0000 mg | ORAL_TABLET | Freq: Two times a day (BID) | ORAL | 3 refills | Status: DC

## 2017-09-18 ENCOUNTER — Other Ambulatory Visit: Payer: Self-pay | Admitting: Family Medicine

## 2017-10-12 ENCOUNTER — Other Ambulatory Visit: Payer: Self-pay | Admitting: Family Medicine

## 2017-10-16 ENCOUNTER — Telehealth: Payer: Self-pay | Admitting: Family Medicine

## 2017-10-16 NOTE — Telephone Encounter (Signed)
He is under the care for Dr. Redmond Baseman for cholesteatoma they referred him to specialist at [facility]. He is complaining with severe dizziness and wants to know if there is anything that he can use for the dizziness? They also have a call into Dr. Redmond Baseman. She knows you are off and still would like me to send note to you.

## 2017-10-17 NOTE — Telephone Encounter (Signed)
If Dr. Redmond Baseman has not prescribed anything, please try Valium 2 mg with each meal and at bedtime until Dr. Redmond Baseman makes a decision about what he would like to do for the dizziness.  #28 with no refill

## 2017-10-18 NOTE — Telephone Encounter (Signed)
DR May - at [facility] - no surgery.   Dizziness from cholesteatoma.  HA in base of neck  - he lays and reads a lot - tara think it could be from that.  Baxter Flattery is unsure about this - she prefers [place] to not send this in at this time and she may wait on Dr bates.   She will call back if they need this med.

## 2017-10-24 NOTE — Progress Notes (Signed)
Cardiology Office Note   Date:  10/25/2017   ID:  Maurice Weaver, DOB [birth year], MRN [id number]  PCP:  Chipper Herb, MD  Cardiologist:   Minus Breeding, MD  Referring:  Chipper Herb, MD  Chief Complaint  Patient presents with  . Coronary Artery Disease      History of Present Illness: Maurice Weaver is a 72 y.o. male who presents for of CAD with a PCI in the late 90s by Dr. Olevia Perches.   In 2015 he had a POET (Plain Old Exercise Treadmill) which he passed without any evidence of ischemia.  Since I last saw him he has done well.  The patient denies any new symptoms such as chest discomfort, neck or arm discomfort. There has been no new shortness of breath, PND or orthopnea. There have been no reported palpitations, presyncope or syncope.    Past Medical History:  Diagnosis Date  . AF (atrial fibrillation) ([place])   . Asthma    as a child. stable  . CAD (coronary artery disease)   . Cholesteatoma   . Decreased hearing   . Diverticulitis of colon    recurrent w/abscess - resected 2011  . Diverticulosis   . ED (erectile dysfunction)   . Fundic gland polyps of stomach, benign   . GERD (gastroesophageal reflux disease)   . Hiatal hernia   . Hyperlipidemia   . Hypertension   . Melanoma ([place]) 2013   Right Cheek  . MRSA (methicillin resistant staph aureus) culture positive 07/2009  . Nephrolithiasis   . S/P angioplasty   . Schatzki's ring   . Vitamin D deficiency     Past Surgical History:  Procedure Laterality Date  . COLON SURGERY  2011   diverticulitis left resection-[place]  . COLONOSCOPY  Multiple  . CORONARY ANGIOPLASTY WITH STENT PLACEMENT  2000  . ESOPHAGOGASTRODUODENOSCOPY  Multiple  . INNER EAR SURGERY Bilateral 2015   x6  cholesteatomas  . MELANOMA EXCISION  2012  . TONSILLECTOMY AND ADENOIDECTOMY       Current Outpatient Medications  Medication Sig Dispense Refill  . albuterol (PROVENTIL HFA;VENTOLIN HFA) 108 (90 Base) MCG/ACT inhaler INHALE  2 PUFFS INTO THE LUNGS EVERY 6 (SIX) HOURS AS NEEDED FOR WHEEZING OR SHORTNESS OF BREATH. 54 g 1  . Cholecalciferol (VITAMIN D3) 5000 units CAPS Take 1 capsule by mouth daily.    Marland Kitchen ezetimibe (ZETIA) 10 MG tablet TAKE 1 TABLET AT BEDTIME 90 tablet 1  . hydrochlorothiazide (HYDRODIURIL) 25 MG tablet TAKE 1 TABLET EVERY DAY 90 tablet 1  . memantine (NAMENDA) 5 MG tablet Take 1 tablet (5 mg total) by mouth 2 (two) times daily. 180 tablet 3  . metoprolol succinate (TOPROL-XL) 100 MG 24 hr tablet TAKE 1 TABLET EVERY DAY 90 tablet 1  . omeprazole (PRILOSEC) 40 MG capsule TAKE 1 CAPSULE EVERY DAY 90 capsule 1  . potassium chloride (K-DUR,KLOR-CON) 10 MEQ tablet TAKE 1 TABLET EVERY DAY 90 tablet 1  . ramipril (ALTACE) 10 MG capsule TAKE 1 CAPSULE EVERY DAY 90 capsule 0  . rosuvastatin (CRESTOR) 20 MG tablet TAKE 1 TABLET AT BEDTIME 90 tablet 1  . aspirin 81 MG EC tablet Take 1 tablet (81 mg total) by mouth daily. Swallow whole.    . fluticasone (FLONASE) 50 MCG/ACT nasal spray Place 2 sprays into both nostrils daily. 48 g 3  . Loperamide HCl (IMODIUM A-D PO) Take 1 tablet by mouth daily as needed.     No current  facility-administered medications for this visit.     Allergies:   Ampicillin and Erythromycin    ROS:  Please see the history of present illness.   Otherwise, review of systems are positive for none.   All other systems are reviewed and negative.    PHYSICAL EXAM: VS:  BP 120/80   Pulse 74   Ht 6' (1.829 m)   Wt 193 lb (87.5 kg)   BMI 26.18 kg/m  , BMI Body mass index is 26.18 kg/m.  GENERAL:  Well appearing NECK:  No jugular venous distention, waveform within normal limits, carotid upstroke brisk and symmetric, no bruits, no thyromegaly LUNGS:  Clear to auscultation bilaterally CHEST:  Unremarkable HEART:  PMI not displaced or sustained,S1 and S2 within normal limits, no S3, no S4, no clicks, no rubs, no murmurs ABD:  Flat, positive bowel sounds normal in frequency in pitch, no  bruits, no rebound, no guarding, no midline pulsatile mass, no hepatomegaly, no splenomegaly EXT:  2 plus pulses throughout, no edema, no cyanosis no clubbing   EKG:  EKG is ordered today. The ekg ordered today demonstrates sinus rhythm, rate 74, axis within normal limits, intervals within normal limits, no acute ST-T wave changes.   Recent Labs: 08/09/2017: ALT 18; BUN 13; Creatinine, Ser 1.07; Hemoglobin 16.0; Platelets 225; Potassium 3.7; Sodium 139    Lipid Panel    Component Value Date/Time   CHOL 139 08/09/2017 1042   CHOL 120 06/13/2012 1011   TRIG 153 (H) 08/09/2017 1042   TRIG 234 (H) 06/07/2016 1025   TRIG 111 06/13/2012 1011   HDL 45 08/09/2017 1042   HDL 39 (L) 06/07/2016 1025   HDL 35 (L) 06/13/2012 1011   CHOLHDL 3.1 08/09/2017 1042   LDLCALC 63 08/09/2017 1042   LDLCALC 71 09/10/2013 0934   LDLCALC 63 06/13/2012 1011      Wt Readings from Last 3 Encounters:  10/25/17 193 lb (87.5 kg)  08/09/17 194 lb (88 kg)  04/13/17 194 lb (88 kg)      Other studies Reviewed: Additional studies/ records that were reviewed today include:  Labs Review of the above records demonstrates:     ASSESSMENT AND PLAN:    CAD: The patient has no new symptoms.  No change in therapy except that he stopped his baby aspirin and he should restart this.  He is not at low risk for future cardiovascular events.  He is not at high risk for bleeding.  HTN:  The blood pressure is at target.  No change in therapy.   DYSLIPIDEMIA:  His LDL was 63 this year.  No change in therapy.  ATRIAL FIB: This is mentioned in his records before he saw me.  However, I have never seen atrial fibrillation on him.  I have not seen documentation of this.  No change in therapy.  He has no symptoms.   Current medicines are reviewed at length with the patient today.  The patient does not have concerns regarding medicines.  The following changes have been made:  None  Labs/ tests ordered today include:   None  Orders Placed This Encounter  Procedures  . EKG 12-Lead     Disposition:   FU with me in 18 months.   Signed, Minus Breeding, MD  10/25/2017 1:16 PM    [facility]

## 2017-10-25 ENCOUNTER — Encounter: Payer: Self-pay | Admitting: Cardiology

## 2017-10-25 ENCOUNTER — Ambulatory Visit: Payer: Medicare HMO | Admitting: Cardiology

## 2017-10-25 VITALS — BP 120/80 | HR 74 | Ht 72.0 in | Wt 193.0 lb

## 2017-10-25 DIAGNOSIS — I10 Essential (primary) hypertension: Secondary | ICD-10-CM

## 2017-10-25 DIAGNOSIS — I2510 Atherosclerotic heart disease of native coronary artery without angina pectoris: Secondary | ICD-10-CM | POA: Diagnosis not present

## 2017-10-25 MED ORDER — ASPIRIN 81 MG PO TBEC: 81.0000 mg | DELAYED_RELEASE_TABLET | Freq: Every day | ORAL | Status: DC

## 2017-10-25 NOTE — Patient Instructions (Signed)
Medication Instructions:  Please restart your aspirin 81 mg a day. Continue all other medications as listed.  Follow-Up: Follow up in 18 months with Dr. Percival Spanish.  You will receive a letter in the mail 2 months before you are due.  Please call us when you receive this letter to schedule your follow up appointment.  If you need a refill on your cardiac medications before your next appointment, please call your pharmacy.  Thank you for choosing [facility]!!

## 2017-11-23 ENCOUNTER — Other Ambulatory Visit: Payer: Self-pay | Admitting: Family Medicine

## 2017-12-04 ENCOUNTER — Ambulatory Visit (INDEPENDENT_AMBULATORY_CARE_PROVIDER_SITE_OTHER): Payer: Medicare HMO | Admitting: *Deleted

## 2017-12-04 DIAGNOSIS — Z23 Encounter for immunization: Secondary | ICD-10-CM

## 2017-12-14 ENCOUNTER — Ambulatory Visit (INDEPENDENT_AMBULATORY_CARE_PROVIDER_SITE_OTHER): Payer: Medicare HMO | Admitting: Family Medicine

## 2017-12-14 ENCOUNTER — Encounter: Payer: Self-pay | Admitting: Family Medicine

## 2017-12-14 VITALS — BP 131/78 | HR 73 | Temp 96.9°F | Ht 73.0 in | Wt 191.0 lb

## 2017-12-14 DIAGNOSIS — I700 Atherosclerosis of aorta: Secondary | ICD-10-CM | POA: Diagnosis not present

## 2017-12-14 DIAGNOSIS — E7800 Pure hypercholesterolemia, unspecified: Secondary | ICD-10-CM

## 2017-12-14 DIAGNOSIS — H9193 Unspecified hearing loss, bilateral: Secondary | ICD-10-CM

## 2017-12-14 DIAGNOSIS — E559 Vitamin D deficiency, unspecified: Secondary | ICD-10-CM | POA: Diagnosis not present

## 2017-12-14 DIAGNOSIS — I10 Essential (primary) hypertension: Secondary | ICD-10-CM

## 2017-12-14 DIAGNOSIS — N400 Enlarged prostate without lower urinary tract symptoms: Secondary | ICD-10-CM

## 2017-12-14 DIAGNOSIS — K219 Gastro-esophageal reflux disease without esophagitis: Secondary | ICD-10-CM

## 2017-12-14 NOTE — Progress Notes (Signed)
Subjective:    Patient ID: Maurice Weaver, male    DOB: 1946/02/25, 72 y.o.   MRN: [id number]  HPI Pt here for follow up and management of chronic medical problems which includes hypertension and hyperlipidemia. He is taking medication regularly.  Patient is doing well today with no specific complaints.  His weight is down 2 pounds from previous visit.  He will get lab work today.  He continues to be followed by the ear nose and throat specialist, Dr. Redmond Baseman for his diminishing hearing.  She continues to have some hearing issues and Dr. Redmond Baseman has referred him to a hearing specialist at [facility].  They are approaching his left ear findings cautiously and do have plans for follow-up in the next 2 or 3 months.  The patient today denies any chest pain pressure tightness or palpitations.  He denies any shortness of breath.  He denies any trouble with swallowing heartburn indigestion nausea vomiting diarrhea blood in the stool black tarry bowel movements or change in bowel habits.  He is passing his water without problems.  He is obviously concerned about his hearing issues because all of his hearing comes from the left ear and is transmitted to the right ear and he has plans to follow-up with the specialist to make sure that this is taking care of.     Patient Active Problem List   Diagnosis Date Noted  . Aortic atherosclerosis ([place]) 10/06/2015  . Hiatal hernia 10/06/2015  . GERD (gastroesophageal reflux disease) 01/23/2013  . Insomnia 01/23/2013  . Melanoma in situ of cheek, history of 06/13/2012  . Hearing deficit 06/13/2012  . Hyperlipidemia 12/24/2008  . HYPERTENSION, BENIGN 12/24/2008  . Coronary atherosclerosis of native coronary artery 12/24/2008   Outpatient Encounter Medications as of 12/14/2017  Medication Sig  . albuterol (PROVENTIL HFA;VENTOLIN HFA) 108 (90 Base) MCG/ACT inhaler INHALE 2 PUFFS INTO THE LUNGS EVERY 6 (SIX) HOURS AS NEEDED FOR  WHEEZING OR SHORTNESS OF BREATH.  Marland Kitchen aspirin 81 MG EC tablet Take 1 tablet (81 mg total) by mouth daily. Swallow whole.  . Cholecalciferol (VITAMIN D3) 5000 units CAPS Take 1 capsule by mouth daily.  Marland Kitchen ezetimibe (ZETIA) 10 MG tablet TAKE 1 TABLET AT BEDTIME  . fluticasone (FLONASE) 50 MCG/ACT nasal spray Place 2 sprays into both nostrils daily.  . hydrochlorothiazide (HYDRODIURIL) 25 MG tablet TAKE 1 TABLET EVERY DAY  . Loperamide HCl (IMODIUM A-D PO) Take 1 tablet by mouth daily as needed.  . memantine (NAMENDA) 5 MG tablet Take 1 tablet (5 mg total) by mouth 2 (two) times daily.  . metoprolol succinate (TOPROL-XL) 100 MG 24 hr tablet TAKE 1 TABLET EVERY DAY  . omeprazole (PRILOSEC) 40 MG capsule TAKE 1 CAPSULE EVERY DAY  . potassium chloride (K-DUR,KLOR-CON) 10 MEQ tablet TAKE 1 TABLET EVERY DAY  . ramipril (ALTACE) 10 MG capsule TAKE 1 CAPSULE EVERY DAY  . rosuvastatin (CRESTOR) 20 MG tablet TAKE 1 TABLET AT BEDTIME   No facility-administered encounter medications on file as of 12/14/2017.      Review of Systems  Constitutional: Negative.   HENT: Negative.   Eyes: Negative.   Respiratory: Negative.   Cardiovascular: Negative.   Gastrointestinal: Negative.   Endocrine: Negative.   Genitourinary: Negative.   Musculoskeletal: Negative.   Skin: Negative.   Allergic/Immunologic: Negative.   Neurological: Negative.   Hematological: Negative.   Psychiatric/Behavioral: Negative.        Objective:   Physical Exam  Constitutional:  He is oriented to person, place, and time. He appears well-developed and well-nourished.  The patient is pleasant and alert and of course continues to have diminished hearing.  HENT:  Head: Normocephalic and atraumatic.  Right Ear: External ear normal.  Left Ear: External ear normal.  Nose: Nose normal.  Mouth/Throat: Oropharynx is clear and moist. No oropharyngeal exudate.  Eyes: Pupils are equal, round, and reactive to light. Conjunctivae and EOM are  normal. Right eye exhibits no discharge. Left eye exhibits no discharge. No scleral icterus.  Follow-up regularly with ophthalmologist  Neck: Normal range of motion. Neck supple. No thyromegaly present.  No bruits thyromegaly or anterior cervical adenopathy  Cardiovascular: Normal rate, regular rhythm, normal heart sounds and intact distal pulses.  No murmur heard. The heart is regular at 60/min with good pedal pulses bilaterally  Pulmonary/Chest: Effort normal and breath sounds normal. No respiratory distress. He has no wheezes. He has no rales. He exhibits no tenderness.  No wheezing or rales and good breath sounds anteriorly and posteriorly and no axillary adenopathy no chest wall masses.  Abdominal: Soft. Bowel sounds are normal. He exhibits no mass. There is no tenderness. There is no rebound and no guarding.  No spleen or liver enlargement.  No epigastric tenderness.  No masses no bruits and no inguinal adenopathy.  Musculoskeletal: Normal range of motion. He exhibits no edema or tenderness.  No edema and good range of motion of all extremities  Lymphadenopathy:    He has no cervical adenopathy.  Neurological: He is alert and oriented to person, place, and time. He has normal reflexes. No cranial nerve deficit.  Reflexes are 2+ and equal bilaterally  Skin: Skin is warm and dry. No rash noted.  Psychiatric: He has a normal mood and affect. His behavior is normal. Judgment and thought content normal.  The patient's mood affect and behavior are all normal.  Nursing note and vitals reviewed.         Assessment & Plan:  1. Gastroesophageal reflux disease, esophagitis presence not specified -No complaints with this today.  Patient will continue with his omeprazole. - CBC with Differential/Platelet - Hepatic function panel  2. HYPERTENSION, BENIGN -Blood pressure is good he will continue with current treatment - BMP8+EGFR - CBC with Differential/Platelet - Hepatic function  panel  3. Vitamin D deficiency -Continue with vitamin D replacement pending results of lab work - CBC with Differential/Platelet - VITAMIN D 25 Hydroxy (Vit-D Deficiency, Fractures)  4. Pure hypercholesterolemia -Continue with cholesterol treatment and therapeutic lifestyle changes to achieve weight loss - CBC with Differential/Platelet - Lipid panel  5. Thoracic aortic atherosclerosis ([place]) -Continue with current statin therapy of Crestor and as aggressive therapeutic lifestyle changes as possible - CBC with Differential/Platelet - Lipid panel  6. Benign prostatic hyperplasia, unspecified whether lower urinary tract symptoms present -No complaints with voiding today - CBC with Differential/Platelet  7. Aortic atherosclerosis ([place]) -Continue with Crestor pending results of lab work - CBC with Differential/Platelet - Lipid panel  8. Hearing deficit, bilateral -Follow-up with Dr. Redmond Baseman and Dr. day to manage hearing deficits  Patient Instructions                       Medicare Annual Wellness Visit  [facility] and the medical providers at [facility] strive to bring you the best medical care.  In doing so we not only want to address your current medical conditions and concerns but also to detect new  conditions early and prevent illness, disease and health-related problems.    Medicare offers a yearly Wellness Visit which allows our clinical staff to assess your need for preventative services including immunizations, lifestyle education, counseling to decrease risk of preventable diseases and screening for fall risk and other medical concerns.    This visit is provided free of charge (no copay) for all Medicare recipients. The clinical pharmacists at [facility] have begun to conduct these Wellness Visits which will also include a thorough review of all your medications.    As you primary medical provider recommend that you make an  appointment for your Annual Wellness Visit if you have not done so already this year.  You may set up this appointment before you leave today or you may call back ([phone]) and schedule an appointment.  Please make sure when you call that you mention that you are scheduling your Annual Wellness Visit with the clinical pharmacist so that the appointment may be made for the proper length of time.     Continue current medications. Continue good therapeutic lifestyle changes which include good diet and exercise. Fall precautions discussed with patient. If an FOBT was given today- please return it to our front desk. If you are over 73 years old - you may need Prevnar 5 or the adult Pneumonia vaccine.  **Flu shots are available--- please call and schedule a FLU-CLINIC appointment**  After your visit with [place] today you will receive a survey in the mail or online from [employer] regarding your care with [place]. Please take a moment to fill this out. Your feedback is very important to [place] as you can help [place] better understand your patient needs as well as improve your experience and satisfaction. WE CARE ABOUT YOU!!!   Continue walking regularly Eat a healthy diet Watch sodium intake Drink plenty of fluids and stay well-hydrated Avoid the use of the overhead fans as much as possible Keep the house as cool as possible this winter Use nasal saline to keep nasal passages more moist Continue as needed use of albuterol and use Flonase regularly Keep regular appointments with ear nose and throat specialist to monitor hearing issues.  Arrie Senate MD

## 2017-12-14 NOTE — Patient Instructions (Addendum)
Medicare Annual Wellness Visit  [facility] and the medical providers at [facility] strive to bring you the best medical care.  In doing so we not only want to address your current medical conditions and concerns but also to detect new conditions early and prevent illness, disease and health-related problems.    Medicare offers a yearly Wellness Visit which allows our clinical staff to assess your need for preventative services including immunizations, lifestyle education, counseling to decrease risk of preventable diseases and screening for fall risk and other medical concerns.    This visit is provided free of charge (no copay) for all Medicare recipients. The clinical pharmacists at [facility] have begun to conduct these Wellness Visits which will also include a thorough review of all your medications.    As you primary medical provider recommend that you make an appointment for your Annual Wellness Visit if you have not done so already this year.  You may set up this appointment before you leave today or you may call back ([phone]) and schedule an appointment.  Please make sure when you call that you mention that you are scheduling your Annual Wellness Visit with the clinical pharmacist so that the appointment may be made for the proper length of time.     Continue current medications. Continue good therapeutic lifestyle changes which include good diet and exercise. Fall precautions discussed with patient. If an FOBT was given today- please return it to our front desk. If you are over 69 years old - you may need Prevnar 93 or the adult Pneumonia vaccine.  **Flu shots are available--- please call and schedule a FLU-CLINIC appointment**  After your visit with [place] today you will receive a survey in the mail or online from [employer] regarding your care with [place]. Please take a moment to fill this out. Your feedback is very  important to [place] as you can help [place] better understand your patient needs as well as improve your experience and satisfaction. WE CARE ABOUT YOU!!!   Continue walking regularly Eat a healthy diet Watch sodium intake Drink plenty of fluids and stay well-hydrated Avoid the use of the overhead fans as much as possible Keep the house as cool as possible this winter Use nasal saline to keep nasal passages more moist Continue as needed use of albuterol and use Flonase regularly Keep regular appointments with ear nose and throat specialist to monitor hearing issues.

## 2017-12-20 ENCOUNTER — Other Ambulatory Visit: Payer: Self-pay | Admitting: Family Medicine

## 2018-01-29 ENCOUNTER — Other Ambulatory Visit: Payer: Self-pay | Admitting: Family Medicine

## 2018-01-29 DIAGNOSIS — H7012 Chronic mastoiditis, left ear: Secondary | ICD-10-CM | POA: Diagnosis not present

## 2018-01-29 DIAGNOSIS — H6122 Impacted cerumen, left ear: Secondary | ICD-10-CM | POA: Diagnosis not present

## 2018-02-06 ENCOUNTER — Other Ambulatory Visit: Payer: Self-pay | Admitting: Family Medicine

## 2018-04-04 ENCOUNTER — Other Ambulatory Visit: Payer: Self-pay | Admitting: Family Medicine

## 2018-04-18 DIAGNOSIS — R52 Pain, unspecified: Secondary | ICD-10-CM | POA: Diagnosis not present

## 2018-04-18 DIAGNOSIS — J111 Influenza due to unidentified influenza virus with other respiratory manifestations: Secondary | ICD-10-CM | POA: Diagnosis not present

## 2018-04-24 ENCOUNTER — Ambulatory Visit (INDEPENDENT_AMBULATORY_CARE_PROVIDER_SITE_OTHER): Payer: Medicare HMO | Admitting: Family Medicine

## 2018-04-24 ENCOUNTER — Encounter: Payer: Self-pay | Admitting: Family Medicine

## 2018-04-24 VITALS — BP 113/67 | HR 81 | Temp 97.3°F | Ht 73.0 in | Wt 189.0 lb

## 2018-04-24 DIAGNOSIS — E559 Vitamin D deficiency, unspecified: Secondary | ICD-10-CM

## 2018-04-24 DIAGNOSIS — K219 Gastro-esophageal reflux disease without esophagitis: Secondary | ICD-10-CM | POA: Diagnosis not present

## 2018-04-24 DIAGNOSIS — I700 Atherosclerosis of aorta: Secondary | ICD-10-CM | POA: Diagnosis not present

## 2018-04-24 DIAGNOSIS — I2510 Atherosclerotic heart disease of native coronary artery without angina pectoris: Secondary | ICD-10-CM | POA: Diagnosis not present

## 2018-04-24 DIAGNOSIS — C439 Malignant melanoma of skin, unspecified: Secondary | ICD-10-CM

## 2018-04-24 DIAGNOSIS — N400 Enlarged prostate without lower urinary tract symptoms: Secondary | ICD-10-CM

## 2018-04-24 DIAGNOSIS — I10 Essential (primary) hypertension: Secondary | ICD-10-CM | POA: Diagnosis not present

## 2018-04-24 DIAGNOSIS — E7800 Pure hypercholesterolemia, unspecified: Secondary | ICD-10-CM | POA: Diagnosis not present

## 2018-04-24 NOTE — Progress Notes (Signed)
Subjective:    Patient ID: Maurice Weaver, male    DOB: 01-Mar-1945, 73 y.o.   MRN: [id number]  HPI Pt here for follow up and management of chronic medical problems which includes hypertension.  He is taking medication regularly.  The patient is doing well other than complaining with some shortness of breath.  He does not need any refills.  His weight is down 2 pounds.  The patient has a history of melanoma GERD hyperlipidemia hypertension and kidney stones.  He also has coronary artery disease erectile dysfunction and has a long history of cholesteatoma.  His goal for cholesterol is to have an LDL-C less than 70.  The patient did have a chest CT in the past with no evidence of pulmonary nodules and only a large hiatal hernia with aortic atherosclerosis.  He did see a pulmonologist.  He was evaluated with pulmonary function testing and was started on albuterol and Symbicort.  Was evaluated for alpha-1 antitrypsin deficiency as well as allergies.  Patient is pleasant and alert and of course still has trouble with his hearing.  He is now being followed by Dr. day at [facility] and sees him periodically instead of Dr. Redmond Baseman.  Today he denies any chest pain pressure or tightness.  He has had some increased shortness of breath which he says is typical with changes in the weather and especially with a kind of weather were having today.  He is still using his albuterol inhaler and his Symbicort.  He is currently not taking any Mucinex.  He has not been running any fever.  He was treated recently for the flu and took Tamiflu and has felt better since taking that.  He denies any trouble with swallowing heartburn indigestion nausea vomiting diarrhea or blood in the stool and says he is passing his water well.  His pulse ox today was 98% and all of his vital signs were stable.   Patient Active Problem List   Diagnosis Date Noted  . Aortic atherosclerosis ([place]) 10/06/2015  .  Hiatal hernia 10/06/2015  . GERD (gastroesophageal reflux disease) 01/23/2013  . Insomnia 01/23/2013  . Melanoma in situ of cheek, history of 06/13/2012  . Hearing deficit 06/13/2012  . Hyperlipidemia 12/24/2008  . HYPERTENSION, BENIGN 12/24/2008  . Coronary atherosclerosis of native coronary artery 12/24/2008   Outpatient Encounter Medications as of 04/24/2018  Medication Sig  . albuterol (PROVENTIL HFA;VENTOLIN HFA) 108 (90 Base) MCG/ACT inhaler INHALE 2 PUFFS INTO THE LUNGS EVERY 6 (SIX) HOURS AS NEEDED FOR WHEEZING OR SHORTNESS OF BREATH.  Marland Kitchen aspirin 81 MG EC tablet Take 1 tablet (81 mg total) by mouth daily. Swallow whole.  . Cholecalciferol (VITAMIN D3) 5000 units CAPS Take 1 capsule by mouth daily.  Marland Kitchen donepezil (ARICEPT) 5 MG tablet TAKE 1 TABLET AT BEDTIME  . ezetimibe (ZETIA) 10 MG tablet TAKE 1 TABLET AT BEDTIME  . fluticasone (FLONASE) 50 MCG/ACT nasal spray Place 2 sprays into both nostrils daily.  . hydrochlorothiazide (HYDRODIURIL) 25 MG tablet TAKE 1 TABLET EVERY DAY  . Loperamide HCl (IMODIUM A-D PO) Take 1 tablet by mouth daily as needed.  . memantine (NAMENDA) 5 MG tablet Take 1 tablet (5 mg total) by mouth 2 (two) times daily.  . metoprolol succinate (TOPROL-XL) 100 MG 24 hr tablet TAKE 1 TABLET EVERY DAY  . omeprazole (PRILOSEC) 40 MG capsule TAKE 1 CAPSULE EVERY DAY  . potassium chloride (K-DUR,KLOR-CON) 10 MEQ tablet TAKE 1 TABLET EVERY DAY  .  ramipril (ALTACE) 10 MG capsule TAKE 1 CAPSULE EVERY DAY  . rosuvastatin (CRESTOR) 20 MG tablet TAKE 1 TABLET AT BEDTIME   No facility-administered encounter medications on file as of 04/24/2018.      Review of Systems  Constitutional: Negative.   HENT: Negative.   Eyes: Negative.   Respiratory: Positive for shortness of breath ("this time of year").   Cardiovascular: Negative.   Gastrointestinal: Negative.   Endocrine: Negative.   Genitourinary: Negative.   Musculoskeletal: Negative.   Skin: Negative.     Allergic/Immunologic: Negative.   Neurological: Negative.   Hematological: Negative.   Psychiatric/Behavioral: Negative.        Objective:   Physical Exam Vitals signs and nursing note reviewed.  Constitutional:      General: He is not in acute distress.    Appearance: Normal appearance. He is well-developed and normal weight. He is not ill-appearing.     Comments: The patient is pleasant and doing well overall other than some shortness of breath with the current weather issues.  HENT:     Head: Normocephalic and atraumatic.     Right Ear: Ear canal and external ear normal. There is no impacted cerumen.     Left Ear: Ear canal and external ear normal. There is no impacted cerumen.     Ears:     Comments: Both TMs are scarred.  The ear canals are clear.  He wears hearing aids in both ears and he has no hearing on the right and that hearing aid he wears on that side transfers the hearing to the left ear.    Nose: Nose normal. No congestion.     Mouth/Throat:     Mouth: Mucous membranes are moist.     Pharynx: No oropharyngeal exudate.  Eyes:     General: No scleral icterus.       Right eye: No discharge.        Left eye: No discharge.     Extraocular Movements: Extraocular movements intact.     Conjunctiva/sclera: Conjunctivae normal.     Pupils: Pupils are equal, round, and reactive to light.  Neck:     Musculoskeletal: Normal range of motion and neck supple.     Thyroid: No thyromegaly.     Vascular: No carotid bruit.     Trachea: No tracheal deviation.     Comments: No bruits thyromegaly or anterior cervical adenopathy Cardiovascular:     Rate and Rhythm: Normal rate and regular rhythm.     Pulses: Normal pulses.     Heart sounds: Normal heart sounds. No murmur.     Comments: Heart is regular at 84/min with good pedal pulses and no edema Pulmonary:     Effort: Pulmonary effort is normal. No respiratory distress.     Breath sounds: Normal breath sounds. No wheezing or  rales.     Comments: Clear anteriorly and posteriorly and no axillary adenopathy chest wall tenderness or masses Chest:     Chest wall: No tenderness.  Abdominal:     General: Abdomen is flat. Bowel sounds are normal.     Palpations: Abdomen is soft. There is no mass.     Tenderness: There is no abdominal tenderness. There is no guarding or rebound.     Comments: No masses tenderness organ enlargement or bruits  Musculoskeletal: Normal range of motion.        General: No tenderness.     Right lower leg: No edema.     Left lower  leg: No edema.  Lymphadenopathy:     Cervical: No cervical adenopathy.  Skin:    General: Skin is warm and dry.     Findings: No rash.     Comments: Patient has history of melanoma and sees a dermatologist regularly.  Neurological:     General: No focal deficit present.     Mental Status: He is alert and oriented to person, place, and time. Mental status is at baseline.     Cranial Nerves: No cranial nerve deficit.     Gait: Gait normal.     Deep Tendon Reflexes: Reflexes are normal and symmetric. Reflexes normal.  Psychiatric:        Mood and Affect: Mood normal.        Behavior: Behavior normal.        Thought Content: Thought content normal.        Judgment: Judgment normal.     Comments: Mood affect and behavior for this patient are normal for him.    BP 113/67 (BP Location: Left Arm)   Pulse 81   Temp (!) 97.3 F (36.3 C) (Oral)   Ht '6\' 1"'  (1.854 m)   Wt 189 lb (85.7 kg)   BMI 24.94 kg/m         Assessment & Plan:  1. HYPERTENSION, BENIGN -The blood pressure is good today and he will continue with current treatment - BMP8+EGFR - CBC with Differential/Platelet - Hepatic function panel  2. Vitamin D deficiency -Continue with vitamin D replacement pending results of lab work - CBC with Differential/Platelet - VITAMIN D 25 Hydroxy (Vit-D Deficiency, Fractures)  3. Gastroesophageal reflux disease, esophagitis presence not  specified -No complaints with reflux or acid indigestion.  The patient will continue with his current omeprazole treatment. - CBC with Differential/Platelet - Hepatic function panel  4. Pure hypercholesterolemia -Continue with Crestor and therapeutic lifestyle changes pending results of lab work - CBC with Differential/Platelet - Lipid panel  5. Thoracic aortic atherosclerosis ([place]) -Continue with Crestor and aggressive therapeutic lifestyle changes including diet and exercise - CBC with Differential/Platelet - Lipid panel  6. Benign prostatic hyperplasia, unspecified whether lower urinary tract symptoms present -No complaints today with voiding - CBC with Differential/Platelet  7. Aortic atherosclerosis ([place]) -Continue with Crestor and therapeutic lifestyle changes - CBC with Differential/Platelet - Lipid panel  8. Melanoma of skin ([place]) -Continue to follow-up with dermatology  9. Atherosclerosis of native coronary artery of native heart without angina pectoris -Continue with Crestor with goal of LDL being less than 70.  Patient Instructions                       Medicare Annual Wellness Visit  [facility] and the medical providers at [facility] strive to bring you the best medical care.  In doing so we not only want to address your current medical conditions and concerns but also to detect new conditions early and prevent illness, disease and health-related problems.    Medicare offers a yearly Wellness Visit which allows our clinical staff to assess your need for preventative services including immunizations, lifestyle education, counseling to decrease risk of preventable diseases and screening for fall risk and other medical concerns.    This visit is provided free of charge (no copay) for all Medicare recipients. The clinical pharmacists at [facility] have begun to conduct these Wellness Visits which will also include a  thorough review of all your medications.  As you primary medical provider recommend that you make an appointment for your Annual Wellness Visit if you have not done so already this year.  You may set up this appointment before you leave today or you may call back ([phone]) and schedule an appointment.  Please make sure when you call that you mention that you are scheduling your Annual Wellness Visit with the clinical pharmacist so that the appointment may be made for the proper length of time.    Continue current medications. Continue good therapeutic lifestyle changes which include good diet and exercise. Fall precautions discussed with patient. If an FOBT was given today- please return it to our front desk. If you are over 24 years old - you may need Prevnar 103 or the adult Pneumonia vaccine.  **Flu shots are available--- please call and schedule a FLU-CLINIC appointment**  After your visit with [place] today you will receive a survey in the mail or online from [employer] regarding your care with [place]. Please take a moment to fill this out. Your feedback is very important to [place] as you can help [place] better understand your patient needs as well as improve your experience and satisfaction. WE CARE ABOUT YOU!!!   Continue to follow-up with Dr. day the ear nose and throat specialist at [facility] Continue to use inhalers regularly Take Mucinex maximum strength, 1 twice daily, with a large glass of water, and get the plain Mucinex, blue and white in color. Drink plenty of fluids and stay well-hydrated and keep the house as cool as possible Stay as active as physically possible.  Arrie Senate MD

## 2018-04-24 NOTE — Patient Instructions (Addendum)
Medicare Annual Wellness Visit  [facility] and the medical providers at [facility] strive to bring you the best medical care.  In doing so we not only want to address your current medical conditions and concerns but also to detect new conditions early and prevent illness, disease and health-related problems.    Medicare offers a yearly Wellness Visit which allows our clinical staff to assess your need for preventative services including immunizations, lifestyle education, counseling to decrease risk of preventable diseases and screening for fall risk and other medical concerns.    This visit is provided free of charge (no copay) for all Medicare recipients. The clinical pharmacists at [facility] have begun to conduct these Wellness Visits which will also include a thorough review of all your medications.    As you primary medical provider recommend that you make an appointment for your Annual Wellness Visit if you have not done so already this year.  You may set up this appointment before you leave today or you may call back ([phone]) and schedule an appointment.  Please make sure when you call that you mention that you are scheduling your Annual Wellness Visit with the clinical pharmacist so that the appointment may be made for the proper length of time.    Continue current medications. Continue good therapeutic lifestyle changes which include good diet and exercise. Fall precautions discussed with patient. If an FOBT was given today- please return it to our front desk. If you are over 105 years old - you may need Prevnar 66 or the adult Pneumonia vaccine.  **Flu shots are available--- please call and schedule a FLU-CLINIC appointment**  After your visit with [place] today you will receive a survey in the mail or online from [employer] regarding your care with [place]. Please take a moment to fill this out. Your feedback is very  important to [place] as you can help [place] better understand your patient needs as well as improve your experience and satisfaction. WE CARE ABOUT YOU!!!   Continue to follow-up with Dr. day the ear nose and throat specialist at [facility] Continue to use inhalers regularly Take Mucinex maximum strength, 1 twice daily, with a large glass of water, and get the plain Mucinex, blue and white in color. Drink plenty of fluids and stay well-hydrated and keep the house as cool as possible Stay as active as physically possible.

## 2018-04-25 ENCOUNTER — Other Ambulatory Visit: Payer: Self-pay | Admitting: *Deleted

## 2018-04-25 LAB — CBC WITH DIFFERENTIAL/PLATELET
Basophils Absolute: 0 10*3/uL (ref 0.0–0.2)
Basos: 1 %
EOS (ABSOLUTE): 0 10*3/uL (ref 0.0–0.4)
Eos: 1 %
Hematocrit: 44.4 % (ref 37.5–51.0)
Hemoglobin: 15.8 g/dL (ref 13.0–17.7)
Immature Grans (Abs): 0.1 10*3/uL (ref 0.0–0.1)
Immature Granulocytes: 1 %
LYMPHS: 33 %
Lymphocytes Absolute: 2 10*3/uL (ref 0.7–3.1)
MCH: 32.2 pg (ref 26.6–33.0)
MCHC: 35.6 g/dL (ref 31.5–35.7)
MCV: 90 fL (ref 79–97)
Monocytes Absolute: 0.4 10*3/uL (ref 0.1–0.9)
Monocytes: 7 %
Neutrophils Absolute: 3.5 10*3/uL (ref 1.4–7.0)
Neutrophils: 57 %
PLATELETS: 223 10*3/uL (ref 150–450)
RBC: 4.91 x10E6/uL (ref 4.14–5.80)
RDW: 11.9 % (ref 11.6–15.4)
WBC: 6 10*3/uL (ref 3.4–10.8)

## 2018-04-25 LAB — BMP8+EGFR
BUN/Creatinine Ratio: 10 (ref 10–24)
BUN: 11 mg/dL (ref 8–27)
CO2: 23 mmol/L (ref 20–29)
Calcium: 10 mg/dL (ref 8.6–10.2)
Chloride: 97 mmol/L (ref 96–106)
Creatinine, Ser: 1.11 mg/dL (ref 0.76–1.27)
GFR calc Af Amer: 76 mL/min/{1.73_m2} (ref >59)
GFR, EST NON AFRICAN AMERICAN: 66 mL/min/{1.73_m2} (ref >59)
Glucose: 109 mg/dL — ABNORMAL HIGH (ref 65–99)
Potassium: 3.6 mmol/L (ref 3.5–5.2)
Sodium: 140 mmol/L (ref 134–144)

## 2018-04-25 LAB — LIPID PANEL
Chol/HDL Ratio: 4 ratio (ref 0.0–5.0)
Cholesterol, Total: 133 mg/dL (ref 100–199)
HDL: 33 mg/dL — ABNORMAL LOW (ref >39)
LDL Calculated: 62 mg/dL (ref 0–99)
Triglycerides: 190 mg/dL — ABNORMAL HIGH (ref 0–149)
VLDL CHOLESTEROL CAL: 38 mg/dL (ref 5–40)

## 2018-04-25 LAB — HEPATIC FUNCTION PANEL
ALT: 30 IU/L (ref 0–44)
AST: 30 IU/L (ref 0–40)
Albumin: 4.5 g/dL (ref 3.7–4.7)
Alkaline Phosphatase: 54 IU/L (ref 39–117)
BILIRUBIN TOTAL: 0.8 mg/dL (ref 0.0–1.2)
Bilirubin, Direct: 0.23 mg/dL (ref 0.00–0.40)
Total Protein: 6.8 g/dL (ref 6.0–8.5)

## 2018-04-25 LAB — VITAMIN D 25 HYDROXY (VIT D DEFICIENCY, FRACTURES): Vit D, 25-Hydroxy: 52.5 ng/mL (ref 30.0–100.0)

## 2018-04-25 MED ORDER — ICOSAPENT ETHYL 1 G PO CAPS: 2.0000 g | ORAL_CAPSULE | Freq: Two times a day (BID) | ORAL | 2 refills | Status: DC

## 2018-05-01 ENCOUNTER — Ambulatory Visit (INDEPENDENT_AMBULATORY_CARE_PROVIDER_SITE_OTHER): Payer: Medicare HMO | Admitting: *Deleted

## 2018-05-01 ENCOUNTER — Encounter: Payer: Self-pay | Admitting: *Deleted

## 2018-05-01 VITALS — BP 118/72 | HR 80 | Ht 73.0 in | Wt 191.0 lb

## 2018-05-01 DIAGNOSIS — Z0000 Encounter for general adult medical examination without abnormal findings: Secondary | ICD-10-CM

## 2018-05-01 NOTE — Patient Instructions (Signed)
Please work on your goal of staying active- walking and playing golf are great options.   At your convenience, please bring a copy of your Advance Directives (Healthcare Power of Attorney and Living Will) to our office to be filed in your medical record.  Please follow up with Dr. Laurance Flatten as scheduled.   Please continue to move carefully to avoid falls.   Thank you for coming in for your Annual Wellness Visit today!    Preventive Care 37 Years and Older, Male Preventive care refers to lifestyle choices and visits with your health care provider that can promote health and wellness. What does preventive care include?   A yearly physical exam. This is also called an annual well check.  Dental exams once or twice a year.  Routine eye exams. Ask your health care provider how often you should have your eyes checked.  Personal lifestyle choices, including: ? Daily care of your teeth and gums. ? Regular physical activity. ? Eating a healthy diet. ? Avoiding tobacco and drug use. ? Limiting alcohol use. ? Practicing safe sex. ? Taking low doses of aspirin every day. ? Taking vitamin and mineral supplements as recommended by your health care provider. What happens during an annual well check? The services and screenings done by your health care provider during your annual well check will depend on your age, overall health, lifestyle risk factors, and family history of disease. Counseling Your health care provider may ask you questions about your:  Alcohol use.  Tobacco use.  Drug use.  Emotional well-being.  Home and relationship well-being.  Sexual activity.  Eating habits.  History of falls.  Memory and ability to understand (cognition).  Work and work Statistician. Screening You may have the following tests or measurements:  Height, weight, and BMI.  Blood pressure.  Lipid and cholesterol levels. These may be checked every 5 years, or more frequently if you are  over 30 years old.  Skin check.  Lung cancer screening. You may have this screening every year starting at age 71 if you have a 30-pack-year history of smoking and currently smoke or have quit within the past 15 years.  Colorectal cancer screening. All adults should have this screening starting at age 88 and continuing until age 47. You will have tests every 1-10 years, depending on your results and the type of screening test. People at increased risk should start screening at an earlier age. Screening tests may include: ? Guaiac-based fecal occult blood testing. ? Fecal immunochemical test (FIT). ? Stool DNA test. ? Virtual colonoscopy. ? Sigmoidoscopy. During this test, a flexible tube with a tiny camera (sigmoidoscope) is used to examine your rectum and lower colon. The sigmoidoscope is inserted through your anus into your rectum and lower colon. ? Colonoscopy. During this test, a long, thin, flexible tube with a tiny camera (colonoscope) is used to examine your entire colon and rectum.  Prostate cancer screening. Recommendations will vary depending on your family history and other risks.  Hepatitis C blood test.  Hepatitis B blood test.  Sexually transmitted disease (STD) testing.  Diabetes screening. This is done by checking your blood sugar (glucose) after you have not eaten for a while (fasting). You may have this done every 1-3 years.  Abdominal aortic aneurysm (AAA) screening. You may need this if you are a current or former smoker.  Osteoporosis. You may be screened starting at age 1 if you are at high risk. Talk with your health care provider about  your test results, treatment options, and if necessary, the need for more tests. Vaccines Your health care provider may recommend certain vaccines, such as:  Influenza vaccine. This is recommended every year.  Tetanus, diphtheria, and acellular pertussis (Tdap, Td) vaccine. You may need a Td booster every 10 years.  Varicella  vaccine. You may need this if you have not been vaccinated.  Zoster vaccine. You may need this after age 65.  Measles, mumps, and rubella (MMR) vaccine. You may need at least one dose of MMR if you were born in 1957 or later. You may also need a second dose.  Pneumococcal 13-valent conjugate (PCV13) vaccine. One dose is recommended after age 44.  Pneumococcal polysaccharide (PPSV23) vaccine. One dose is recommended after age 72.  Meningococcal vaccine. You may need this if you have certain conditions.  Hepatitis A vaccine. You may need this if you have certain conditions or if you travel or work in places where you may be exposed to hepatitis A.  Hepatitis B vaccine. You may need this if you have certain conditions or if you travel or work in places where you may be exposed to hepatitis B.  Haemophilus influenzae type b (Hib) vaccine. You may need this if you have certain risk factors. Talk to your health care provider about which screenings and vaccines you need and how often you need them. This information is not intended to replace advice given to you by your health care provider. Make sure you discuss any questions you have with your health care provider. Document Released: 03/13/2015 Document Revised: 04/06/2017 Document Reviewed: 12/16/2014 Elsevier Interactive Patient Education  2019 [employer].

## 2018-05-01 NOTE — Progress Notes (Addendum)
Subjective:   GRAVIEL Weaver is a 73 y.o. male who presents for a subsequent Medicare Annual Wellness Visit.  Maurice Weaver retired from [employer] 3 years ago.  He also worked at [employer]. for 29 years before that.  He enjoys walking for exercise with his dog and playing golf when weather permits.  He attends [employer] several times per year.  He has 2 step children and 3 step grandchildren.  He is close with his step children and he states they check on him regularly.  Patient Care Team: Chipper Herb, MD as PCP - General (Family Medicine) Gatha Mayer, MD as Attending Physician (Gastroenterology) Melida Quitter, MD as Consulting Physician (Otolaryngology) Minus Breeding, MD as Consulting Physician (Cardiology) May, Luretha Rued, MD as Referring Physician (Otolaryngology)  Melida Quitter, MD as Consulting Physician (Otolaryngology)   Hospitalizations, surgeries, and ER visits in previous 12 months No hospitalizations, ER visits, or surgeries this past year.   Review of Systems    Patient reports that his overall health is unchanged compared to last year.  Cardiac Risk Factors include: advanced age (>66men, >62 women);dyslipidemia;hypertension;male gender    All other systems negative       Current Medications (verified) Outpatient Encounter Medications as of 05/01/2018  Medication Sig  . albuterol (PROVENTIL HFA;VENTOLIN HFA) 108 (90 Base) MCG/ACT inhaler INHALE 2 PUFFS INTO THE LUNGS EVERY 6 (SIX) HOURS AS NEEDED FOR WHEEZING OR SHORTNESS OF BREATH.  Marland Kitchen aspirin 81 MG EC tablet Take 1 tablet (81 mg total) by mouth daily. Swallow whole.  . Cholecalciferol (VITAMIN D3) 5000 units CAPS Take 1 capsule by mouth daily.  Marland Kitchen donepezil (ARICEPT) 5 MG tablet TAKE 1 TABLET AT BEDTIME  . ezetimibe (ZETIA) 10 MG tablet TAKE 1 TABLET AT BEDTIME  . fluticasone (FLONASE) 50 MCG/ACT nasal spray Place 2 sprays into both nostrils daily.  . hydrochlorothiazide  (HYDRODIURIL) 25 MG tablet TAKE 1 TABLET EVERY DAY  . Icosapent Ethyl 1 g CAPS Take 2 capsules (2 g total) by mouth 2 (two) times daily.  . Loperamide HCl (IMODIUM A-D PO) Take 1 tablet by mouth daily as needed.  . memantine (NAMENDA) 5 MG tablet Take 1 tablet (5 mg total) by mouth 2 (two) times daily.  . metoprolol succinate (TOPROL-XL) 100 MG 24 hr tablet TAKE 1 TABLET EVERY DAY  . omeprazole (PRILOSEC) 40 MG capsule TAKE 1 CAPSULE EVERY DAY  . potassium chloride (K-DUR,KLOR-CON) 10 MEQ tablet TAKE 1 TABLET EVERY DAY  . ramipril (ALTACE) 10 MG capsule TAKE 1 CAPSULE EVERY DAY  . rosuvastatin (CRESTOR) 20 MG tablet TAKE 1 TABLET AT BEDTIME   No facility-administered encounter medications on file as of 05/01/2018.     Allergies (verified) Ampicillin and Erythromycin   History: Past Medical History:  Diagnosis Date  . AF (atrial fibrillation) ([place])   . Asthma    as a child. stable  . CAD (coronary artery disease)   . Cholesteatoma   . Decreased hearing   . Diverticulitis of colon    recurrent w/abscess - resected 2011  . Diverticulosis   . ED (erectile dysfunction)   . Fundic gland polyps of stomach, benign   . GERD (gastroesophageal reflux disease)   . Hiatal hernia   . Hyperlipidemia   . Hypertension   . Melanoma ([place]) 2013   Right Cheek  . MRSA (methicillin resistant staph aureus) culture positive 07/2009  . Nephrolithiasis   . S/P angioplasty   . Schatzki's ring   .  Vitamin D deficiency    Past Surgical History:  Procedure Laterality Date  . COLON SURGERY  2011   diverticulitis left resection-Toth  . COLONOSCOPY  Multiple  . CORONARY ANGIOPLASTY WITH STENT PLACEMENT  2000  . ESOPHAGOGASTRODUODENOSCOPY  Multiple  . INNER EAR SURGERY Bilateral 2015   x6  cholesteatomas  . MELANOMA EXCISION  2012  . TONSILLECTOMY AND ADENOIDECTOMY     Family History  Problem Relation Age of Onset  . Pancreatic cancer Mother   . Hypertension Mother   . Heart disease Father         Died of MI age 34  . Heart attack Father   . Urolithiasis Brother   . Arthritis Brother   . Dementia Brother   . Alzheimer's disease Sister   . Colon cancer Neg Hx   . Esophageal cancer Neg Hx   . Rectal cancer Neg Hx   . Stomach cancer Neg Hx   . Prostate cancer Neg Hx    Social History   Socioeconomic History  . Marital status: Divorced    Spouse name: Not on file  . Number of children: 2  . Years of education: Not on file  . Highest education level: Some college, no degree  Occupational History  . Occupation: Arts development officer: [employer]  . Financial resource strain: Not hard at all  . Food insecurity:    Worry: Never true    Inability: Never true  . Transportation needs:    Medical: No    Non-medical: No  Tobacco Use  . Smoking status: Former Smoker    Packs/day: 0.50    Years: 30.00    Pack years: 15.00    Types: Cigarettes    Start date: 02/29/1960    Last attempt to quit: 04/29/1998    Years since quitting: 20.0  . Smokeless tobacco: Never Used  . Tobacco comment: Patient quit "cold [place]" in 2000  Substance and Sexual Activity  . Alcohol use: No    Alcohol/week: 0.0 standard drinks  . Drug use: No  . Sexual activity: Not Currently  Lifestyle  . Physical activity:    Days per week: 4 days    Minutes per session: 40 min  . Stress: Only a little  Relationships  . Social connections:    Talks on phone: More than three times a week    Gets together: More than three times a week    Attends religious service: More than 4 times per year    Active member of club or organization: No    Attends meetings of clubs or organizations: Never    Relationship status: Divorced  Other Topics Concern  . Not on file  Social History Narrative   Divorced and lives alone   Does have a step daughter in the area   Retired from [employer]   1 caffeine drinks daily   05/27/2015              Clinical Intake:     Pain Score: 0-No  pain                  Activities of Daily Living In your present state of health, do you have any difficulty performing the following activities: 05/01/2018  Hearing? Y  Comment Wears hearing aids   Vision? N  Difficulty concentrating or making decisions? N  Walking or climbing stairs? N  Dressing or bathing? N  Doing errands, shopping? N  Preparing  Food and eating ? N  Using the Toilet? N  In the past six months, have you accidently leaked urine? N  Do you have problems with loss of bowel control? N  Managing your Medications? N  Managing your Finances? N  Housekeeping or managing your Housekeeping? N  Some recent data might be hidden     Exercise Current Exercise Habits: Home exercise routine, Type of exercise: walking, Time (Minutes): 40, Frequency (Times/Week): 4, Weekly Exercise (Minutes/Week): 160, Intensity: Moderate  Diet Consumes 3 meals a day and 0 snacks a day.  The patient feels that he mostly follow a Regular diet.  Diet History Patient states he avoids eating "junk food" and tries to eat mostly whole foods like fruits, vegetables, and proteins. He has access to all the food he needs.   Depression Screen PHQ 2/9 Scores 05/01/2018 04/24/2018 12/14/2017 08/09/2017 04/13/2017 04/05/2017 11/09/2016  PHQ - 2 Score 0 0 1 1 0 0 0     Fall Risk Fall Risk  05/01/2018 04/24/2018 12/14/2017 08/09/2017 04/13/2017  Falls in the past year? 0 0 No No No  Number falls in past yr: - - - - -  Injury with Fall? - - - - -     Objective:    Today's Vitals   05/01/18 0834  BP: 118/72  Pulse: 80  Weight: 191 lb (86.6 kg)  Height: 6\' 1"  (1.854 m)  PainSc: 0-No pain   Body mass index is 25.2 kg/m.  Advanced Directives 05/01/2018 04/13/2017 09/03/2015 06/23/2015 06/23/2015 05/27/2014  Does Patient Have a Medical Advance Directive? Yes Yes Yes - Yes Yes  Type of Advance Directive [place];Living will Living will [place];Living will  Healthcare Power of Attorney Living will Living will  Does patient want to make changes to medical advance directive? No - Patient declined - No - Patient declined - No - Patient declined No - Patient declined  Copy of [place] in Chart? No - copy requested - No - copy requested - No - copy requested Yes    Hearing/Vision  No hearing or vision deficits noted during visit.  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  He wears hearing aids bilaterally.  He is follow closely by Dr. Redmond Baseman - ENT.  He wears glasses and is seen annually at [facility] in [place].  He states he sees well with his glasses.   Cognitive Function: MMSE - Mini Mental State Exam 05/01/2018 04/13/2017 04/05/2017 09/03/2015 05/27/2014  Not completed: - - - - Refused  Orientation to time 2 4 4 5 5   Orientation to Place 5 5 5 5 5   Registration 3 3 3 3 1   Attention/ Calculation 3 5 5 5 4   Recall 0 0 0 0 3  Language- name 2 objects 2 2 2 2 2   Language- repeat 1 1 1 1 1   Language- follow 3 step command 3 3 3 2 3   Language- read & follow direction 1 1 1 1 1   Write a sentence 1 1 1 1 1   Copy design 1 1 1 1 1   Total score 22 26 26 26 27    Significant decrease in MMSE score from 2019.  Will make sure Dr. Laurance Flatten is aware.          Immunizations and Health Maintenance Immunization History  Administered Date(s) Administered  . Influenza, High Dose Seasonal PF  12/29/2014, 01/27/2016, 11/22/2016, 12/04/2017  . Influenza,inj,quad, With Preservative 01/02/2014  . Influenza-Unspecified 01/23/2013  . Pneumococcal Conjugate-13 01/23/2013  . Pneumococcal Polysaccharide-23 08/29/2010  . Tdap 06/28/2008  . Zoster 11/28/2005   Shingrix declined today  There are no preventive care reminders to display for this patient. Health Maintenance  Topic Date Due  . Hepatitis C Screening  06/07/2021 (Originally 17-Jul-1945)  . TETANUS/TDAP   06/29/2018  . COLON CANCER SCREENING ANNUAL FOBT  08/18/2018  . COLONOSCOPY  07/01/2025  . INFLUENZA VACCINE  Completed  . PNA vac Low Risk Adult  Completed        Assessment:   This is a routine wellness examination for Heather.    Plan:    Goals    . Exercise 150 min/wk Moderate Activity     Walking and playing golf are great options.     . Increase physical activity     Keep walking    . Prevent falls        Health Maintenance & Additional Screening Recommendations: Advanced directives: has an advanced directive - a copy HAS NOT been provided.  Lung: Low Dose CT Chest recommended if Age 29-80 years, 30 pack-year currently smoking OR have quit w/in 15years. Patient does not qualify. Hepatitis C Screening recommended: yes     Keep f/u with Chipper Herb, MD and any other specialty appointments you may have Continue current medications Move carefully to avoid falls.  Aim for at least 150 minutes of moderate activity a week.  Read or work on puzzles daily Stay connected with friends and family  I have personally reviewed and noted the following in the patient's chart:   . Medical and social history . Use of alcohol, tobacco or illicit drugs  . Current medications and supplements . Functional ability and status . Nutritional status . Physical activity . Advanced directives . List of other physicians . Hospitalizations, surgeries, and ER visits in previous 12 months . Vitals . Screenings to include cognitive, depression, and falls . Referrals and appointments  In addition, I have reviewed and discussed with patient certain preventive protocols, quality metrics, and best practice recommendations. A written personalized care plan for preventive services as well as general preventive health recommendations were provided to patient.     Nolberto Hanlon, RN  05/01/2018    "I have reviewed this encounter including the documentation in this note and/or discussed this  patient with the nurse coordinator, K. Hudy . I am certifying that I agree with the content of this note as supervising physician." [place], FNP

## 2018-05-16 ENCOUNTER — Other Ambulatory Visit: Payer: Self-pay | Admitting: Family Medicine

## 2018-05-25 ENCOUNTER — Other Ambulatory Visit: Payer: Self-pay | Admitting: Family Medicine

## 2018-06-08 ENCOUNTER — Other Ambulatory Visit: Payer: Self-pay | Admitting: Family Medicine

## 2018-07-18 ENCOUNTER — Other Ambulatory Visit: Payer: Self-pay | Admitting: Family Medicine

## 2018-07-25 ENCOUNTER — Other Ambulatory Visit: Payer: Self-pay | Admitting: Family Medicine

## 2018-07-30 DIAGNOSIS — H7012 Chronic mastoiditis, left ear: Secondary | ICD-10-CM | POA: Diagnosis not present

## 2018-08-02 ENCOUNTER — Other Ambulatory Visit: Payer: Self-pay | Admitting: Family Medicine

## 2018-08-08 ENCOUNTER — Encounter: Payer: Self-pay | Admitting: Family Medicine

## 2018-08-08 ENCOUNTER — Ambulatory Visit (INDEPENDENT_AMBULATORY_CARE_PROVIDER_SITE_OTHER): Payer: Medicare HMO | Admitting: Family Medicine

## 2018-08-08 ENCOUNTER — Other Ambulatory Visit: Payer: Self-pay

## 2018-08-08 DIAGNOSIS — R21 Rash and other nonspecific skin eruption: Secondary | ICD-10-CM | POA: Diagnosis not present

## 2018-08-08 MED ORDER — NYSTATIN-TRIAMCINOLONE 100000-0.1 UNIT/GM-% EX OINT: 1.0000 "application " | TOPICAL_OINTMENT | Freq: Two times a day (BID) | CUTANEOUS | 0 refills | Status: DC

## 2018-08-08 NOTE — Progress Notes (Signed)
Virtual Visit via telephone Note Due to COVID-19, visit is conducted virtually and was requested by patient. This visit type was conducted due to national recommendations for restrictions regarding the COVID-19 Pandemic (e.g. social distancing) in an effort to limit this patient's exposure and mitigate transmission in our community. All issues noted in this document were discussed and addressed.  A physical exam was not performed with this format.   I connected with Maurice Weaver on 08/08/18 at 1245 by telephone and verified that I am speaking with the correct person using two identifiers. Maurice Weaver is currently located at home and no one is currently with them during visit. The provider, Monia Pouch, FNP is located in their office at time of visit.  I discussed the limitations, risks, security and privacy concerns of performing an evaluation and management service by telephone and the availability of in person appointments. I also discussed with the patient that there may be a patient responsible charge related to this service. The patient expressed understanding and agreed to proceed.  Subjective:  Patient ID: Maurice Weaver, male    DOB: 12-24-1945, 73 y.o.   MRN: [id number]  Chief Complaint:  Rash   HPI: Maurice Weaver is a 73 y.o. male presenting on 08/08/2018 for Rash   Pt reports a rash to his groin and perineal area. Pt states this started about 4 days ago and is getting worse. States the rash burns and itches. States it is red and irritated looking. States he has been outside in the heat and has been sweating a lot. No fever, chills, weakness, or confusion. He has been using over the counter ointments and powder without relief of symptoms.   Rash  This is a new problem. The current episode started in the past 7 days. The problem has been gradually worsening since onset. The affected locations include the groin and genitalia. The rash is characterized by  burning, itchiness, redness and pain. He was exposed to nothing. Pertinent negatives include no anorexia, congestion, cough, diarrhea, eye pain, facial edema, fatigue, fever, joint pain, nail changes, rhinorrhea, shortness of breath, sore throat or vomiting. Past treatments include anti-itch cream. The treatment provided mild relief.     Relevant past medical, surgical, family, and social history reviewed and updated as indicated.  Allergies and medications reviewed and updated.   Past Medical History:  Diagnosis Date   AF (atrial fibrillation) (HCC)    Asthma    as a child. stable   CAD (coronary artery disease)    Cholesteatoma    Decreased hearing    Diverticulitis of colon    recurrent w/abscess - resected 2011   Diverticulosis    ED (erectile dysfunction)    Fundic gland polyps of stomach, benign    GERD (gastroesophageal reflux disease)    Hiatal hernia    Hyperlipidemia    Hypertension    Melanoma ([place]) 2013   Right Cheek   MRSA (methicillin resistant staph aureus) culture positive 07/2009   Nephrolithiasis    S/P angioplasty    Schatzki's ring    Vitamin D deficiency     Past Surgical History:  Procedure Laterality Date   COLON SURGERY  2011   diverticulitis left resection-Toth   COLONOSCOPY  Multiple   CORONARY ANGIOPLASTY WITH STENT PLACEMENT  2000   ESOPHAGOGASTRODUODENOSCOPY  Multiple   INNER EAR SURGERY Bilateral 2015   x6  cholesteatomas   MELANOMA EXCISION  2012   TONSILLECTOMY AND ADENOIDECTOMY  Social History   Socioeconomic History   Marital status: Divorced    Spouse name: Not on file   Number of children: 2   Years of education: Not on file   Highest education level: Some college, no degree  Occupational History   Occupation: Arts development officer: [employer] resource strain: Not hard at all   Food insecurity:    Worry: Never true    Inability: Never true    Transportation needs:    Medical: No    Non-medical: No  Tobacco Use   Smoking status: Former Smoker    Packs/day: 0.50    Years: 30.00    Pack years: 15.00    Types: Cigarettes    Start date: 02/29/1960    Last attempt to quit: 04/29/1998    Years since quitting: 20.2   Smokeless tobacco: Never Used   Tobacco comment: Patient quit "cold [place]" in 2000  Substance and Sexual Activity   Alcohol use: No    Alcohol/week: 0.0 standard drinks   Drug use: No   Sexual activity: Not Currently  Lifestyle   Physical activity:    Days per week: 4 days    Minutes per session: 40 min   Stress: Only a little  Relationships   Social connections:    Talks on phone: More than three times a week    Gets together: More than three times a week    Attends religious service: More than 4 times per year    Active member of club or organization: No    Attends meetings of clubs or organizations: Never    Relationship status: Divorced   Intimate partner violence:    Fear of current or ex partner: No    Emotionally abused: No    Physically abused: No    Forced sexual activity: No  Other Topics Concern   Not on file  Social History Narrative   Divorced and lives alone   Does have a step daughter in the area   Retired from [employer]   1 caffeine drinks daily   05/27/2015             Outpatient Encounter Medications as of 08/08/2018  Medication Sig   albuterol (PROVENTIL HFA;VENTOLIN HFA) 108 (90 Base) MCG/ACT inhaler INHALE 2 PUFFS INTO THE LUNGS EVERY 6 (SIX) HOURS AS NEEDED FOR WHEEZING OR SHORTNESS OF BREATH.   aspirin 81 MG EC tablet Take 1 tablet (81 mg total) by mouth daily. Swallow whole.   Cholecalciferol (VITAMIN D3) 5000 units CAPS Take 1 capsule by mouth daily.   donepezil (ARICEPT) 5 MG tablet TAKE 1 TABLET AT BEDTIME   ezetimibe (ZETIA) 10 MG tablet TAKE 1 TABLET AT BEDTIME   fluticasone (FLONASE) 50 MCG/ACT nasal spray Place 2 sprays into both nostrils daily.    hydrochlorothiazide (HYDRODIURIL) 25 MG tablet TAKE 1 TABLET EVERY DAY   Icosapent Ethyl 1 g CAPS Take 2 capsules (2 g total) by mouth 2 (two) times daily.   Loperamide HCl (IMODIUM A-D PO) Take 1 tablet by mouth daily as needed.   memantine (NAMENDA) 5 MG tablet Take 1 tablet (5 mg total) by mouth 2 (two) times daily.   metoprolol succinate (TOPROL-XL) 100 MG 24 hr tablet TAKE 1 TABLET EVERY DAY   nystatin-triamcinolone ointment (MYCOLOG) Apply 1 application topically 2 (two) times daily.   omeprazole (PRILOSEC) 40 MG capsule TAKE 1 CAPSULE EVERY DAY   potassium chloride (K-DUR) 10 MEQ  tablet TAKE 1 TABLET EVERY DAY   ramipril (ALTACE) 10 MG capsule TAKE 1 CAPSULE EVERY DAY   rosuvastatin (CRESTOR) 20 MG tablet TAKE 1 TABLET AT BEDTIME   Vitamin D, Ergocalciferol, (DRISDOL) 1.25 MG (50000 UT) CAPS capsule TAKE 1 CAPSULE EVERY 7 DAYS.   No facility-administered encounter medications on file as of 08/08/2018.     Allergies  Allergen Reactions   Ampicillin Nausea Only   Erythromycin Other (See Comments)    indigestion    Review of Systems  Constitutional: Negative for chills, fatigue, fever and unexpected weight change.  HENT: Negative for congestion, rhinorrhea and sore throat.   Eyes: Negative for pain.  Respiratory: Negative for cough and shortness of breath.   Cardiovascular: Negative for chest pain, palpitations and leg swelling.  Gastrointestinal: Negative for anorexia, diarrhea and vomiting.  Genitourinary: Negative for decreased urine volume, difficulty urinating, discharge, dysuria, hematuria, penile pain, penile swelling, scrotal swelling and testicular pain.  Musculoskeletal: Negative for joint pain.  Skin: Positive for rash. Negative for nail changes.  Neurological: Negative for weakness.  Psychiatric/Behavioral: Negative for confusion.  All other systems reviewed and are negative.        Observations/Objective: No vital signs or physical exam, this  was a telephone or virtual health encounter.  Pt alert and oriented, answers all questions appropriately, and able to speak in full sentences.    Assessment and Plan: Maurice Weaver was seen today for rash.  Diagnoses and all orders for this visit:  Rash of groin Reported signs and symptoms consistent with yeast dermatitis. Will treat with below. Keep area clean and dry. Symptomatic care discussed. Report any new or worsening symptoms.  -     nystatin-triamcinolone ointment (MYCOLOG); Apply 1 application topically 2 (two) times daily.     Follow Up Instructions: Return if symptoms worsen or fail to improve.    I discussed the assessment and treatment plan with the patient. The patient was provided an opportunity to ask questions and all were answered. The patient agreed with the plan and demonstrated an understanding of the instructions.   The patient was advised to call back or seek an in-person evaluation if the symptoms worsen or if the condition fails to improve as anticipated.  The above assessment and management plan was discussed with the patient. The patient verbalized understanding of and has agreed to the management plan. Patient is aware to call the clinic if symptoms persist or worsen. Patient is aware when to return to the clinic for a follow-up visit. Patient educated on when it is appropriate to go to the emergency department.    I provided 15 minutes of non-face-to-face time during this encounter. The call started at 1245. The call ended at 1300. The other time was used for coordination of care.    Monia Pouch, FNP-C [facility] [address] [place], [place] [address] [phone]

## 2018-09-10 ENCOUNTER — Other Ambulatory Visit: Payer: Self-pay | Admitting: Family Medicine

## 2018-09-12 ENCOUNTER — Ambulatory Visit: Payer: Medicare HMO | Admitting: Family Medicine

## 2018-09-20 ENCOUNTER — Other Ambulatory Visit: Payer: Self-pay

## 2018-09-21 ENCOUNTER — Encounter: Payer: Self-pay | Admitting: Family Medicine

## 2018-09-21 ENCOUNTER — Ambulatory Visit (INDEPENDENT_AMBULATORY_CARE_PROVIDER_SITE_OTHER): Payer: Medicare HMO | Admitting: Family Medicine

## 2018-09-21 ENCOUNTER — Other Ambulatory Visit: Payer: Self-pay | Admitting: Family Medicine

## 2018-09-21 VITALS — BP 130/83 | HR 74 | Temp 98.0°F | Ht 73.0 in | Wt 191.0 lb

## 2018-09-21 DIAGNOSIS — Z125 Encounter for screening for malignant neoplasm of prostate: Secondary | ICD-10-CM | POA: Diagnosis not present

## 2018-09-21 DIAGNOSIS — E782 Mixed hyperlipidemia: Secondary | ICD-10-CM

## 2018-09-21 DIAGNOSIS — K219 Gastro-esophageal reflux disease without esophagitis: Secondary | ICD-10-CM

## 2018-09-21 DIAGNOSIS — R21 Rash and other nonspecific skin eruption: Secondary | ICD-10-CM

## 2018-09-21 DIAGNOSIS — I700 Atherosclerosis of aorta: Secondary | ICD-10-CM | POA: Diagnosis not present

## 2018-09-21 DIAGNOSIS — Z23 Encounter for immunization: Secondary | ICD-10-CM

## 2018-09-21 DIAGNOSIS — H906 Mixed conductive and sensorineural hearing loss, bilateral: Secondary | ICD-10-CM | POA: Diagnosis not present

## 2018-09-21 DIAGNOSIS — E559 Vitamin D deficiency, unspecified: Secondary | ICD-10-CM | POA: Diagnosis not present

## 2018-09-21 DIAGNOSIS — I10 Essential (primary) hypertension: Secondary | ICD-10-CM | POA: Diagnosis not present

## 2018-09-21 MED ORDER — NYSTATIN-TRIAMCINOLONE 100000-0.1 UNIT/GM-% EX OINT: 1.0000 "application " | TOPICAL_OINTMENT | Freq: Two times a day (BID) | CUTANEOUS | 2 refills | Status: DC

## 2018-09-21 NOTE — Patient Instructions (Signed)
It was a pleasure seeing you today, Maurice Weaver.  Information regarding what we discussed is included in this packet.  Please make an appointment to see me in 6 months.   In a few days you may receive a survey in the mail or online from [employer] regarding your visit with [place] today. Please take a moment to fill this out. Your feedback is very important to our office. It can help [place] better understand your needs as well as improve your experience and satisfaction. Thank you for taking your time to complete it. We care about you.  Because of recent events of COVID-19 ("Coronavirus"), please follow CDC recommendations:   1. Wash your hand frequently 2. Avoid touching your face 3. Stay away from people who are sick 4. If you have symptoms such as fever, cough, shortness of breath then call your healthcare provider for further guidance 5. If you are sick, STAY AT HOME, unless otherwise directed by your healthcare provider. 6. Follow directions from state and national officials regarding staying safe    Please feel free to call our office if any questions or concerns arise.  Warm Regards, Monia Pouch, FNP-C [facility] [address] [place], [place] [address] [phone]

## 2018-09-21 NOTE — Progress Notes (Signed)
Subjective:  Patient ID: Maurice Weaver, male    DOB: 02/15/1946, 73 y.o.   MRN: [id number]  Patient Care Team: Chipper Herb, MD as PCP - General (Family Medicine) Gatha Mayer, MD as Attending Physician (Gastroenterology) Melida Quitter, MD as Consulting Physician (Otolaryngology) Minus Breeding, MD as Consulting Physician (Cardiology) May, Luretha Rued, MD as Referring Physician (Otolaryngology) Melida Quitter, MD as Consulting Physician (Otolaryngology)   Chief Complaint:  Medical Management of Chronic Issues   HPI: Maurice Weaver is a 73 y.o. male presenting on 09/21/2018 for Medical Management of Chronic Issues    1. Benign essential hypertension  Complaint with meds - Yes Checking BP at home - No Exercising Regularly - Yes Watching Salt intake - Yes Pertinent ROS:  Headache - No Chest pain - No Dyspnea - intermittent due to humidity Palpitations - No LE edema - No They report good compliance with medications and can restate their regimen by memory. No medication side effects.  BP Readings from Last 3 Encounters:  09/21/18 130/83  05/01/18 118/72  04/24/18 113/67     2. Rash of groin  Pt reports red, pruritic rash to groin. States onset when it warmed up outside and he was sweating a lot. No pain or drainage.    3. Aortic atherosclerosis ([place])  On statin therapy. No chest pain, abdominal pain, palpitations, or dizziness.   4. Gastroesophageal reflux disease without esophagitis  Well controlled. No red flags foe esophagitis.    5. Mixed conductive and sensorineural hearing loss of both ears  Chronic. Followed by audiology and ENT at [facility].    6. Mixed hyperlipidemia  Compliant with medications without associated side effects. Does stay active and try to watch diet.    7. Vitamin D deficiency  On oral repletion therapy. No muscle weakness, arthralgias, or recent fractures.      Relevant past medical, surgical, family, and social history  reviewed and updated as indicated.  Allergies and medications reviewed and updated. Date reviewed: Chart in Epic.   Past Medical History:  Diagnosis Date  . AF (atrial fibrillation) ([place])   . Asthma    as a child. stable  . CAD (coronary artery disease)   . Cholesteatoma   . Decreased hearing   . Diverticulitis of colon    recurrent w/abscess - resected 2011  . Diverticulosis   . ED (erectile dysfunction)   . Fundic gland polyps of stomach, benign   . GERD (gastroesophageal reflux disease)   . Hiatal hernia   . Hyperlipidemia   . Hypertension   . Melanoma ([place]) 2013   Right Cheek  . MRSA (methicillin resistant staph aureus) culture positive 07/2009  . Nephrolithiasis   . S/P angioplasty   . Schatzki's ring   . Vitamin D deficiency     Past Surgical History:  Procedure Laterality Date  . COLON SURGERY  2011   diverticulitis left resection-Toth  . COLONOSCOPY  Multiple  . CORONARY ANGIOPLASTY WITH STENT PLACEMENT  2000  . ESOPHAGOGASTRODUODENOSCOPY  Multiple  . INNER EAR SURGERY Bilateral 2015   x6  cholesteatomas  . MELANOMA EXCISION  2012  . TONSILLECTOMY AND ADENOIDECTOMY      Social History   Socioeconomic History  . Marital status: Divorced    Spouse name: Not on file  . Number of children: 2  . Years of education: Not on file  . Highest education level: Some college, no degree  Occupational History  . Occupation: Research officer, trade union  Employer: [employer]  Social Needs  . Financial resource strain: Not hard at all  . Food insecurity    Worry: Never true    Inability: Never true  . Transportation needs    Medical: No    Non-medical: No  Tobacco Use  . Smoking status: Former Smoker    Packs/day: 0.50    Years: 30.00    Pack years: 15.00    Types: Cigarettes    Start date: 02/29/1960    Quit date: 04/29/1998    Years since quitting: 20.4  . Smokeless tobacco: Never Used  . Tobacco comment: Patient quit "cold [place]" in 2000  Substance and Sexual  Activity  . Alcohol use: No    Alcohol/week: 0.0 standard drinks  . Drug use: No  . Sexual activity: Not Currently  Lifestyle  . Physical activity    Days per week: 4 days    Minutes per session: 40 min  . Stress: Only a little  Relationships  . Social connections    Talks on phone: More than three times a week    Gets together: More than three times a week    Attends religious service: More than 4 times per year    Active member of club or organization: No    Attends meetings of clubs or organizations: Never    Relationship status: Divorced  . Intimate partner violence    Fear of current or ex partner: No    Emotionally abused: No    Physically abused: No    Forced sexual activity: No  Other Topics Concern  . Not on file  Social History Narrative   Divorced and lives alone   Does have a step daughter in the area   Retired from [employer]   1 caffeine drinks daily   05/27/2015             Outpatient Encounter Medications as of 09/21/2018  Medication Sig  . albuterol (PROVENTIL HFA;VENTOLIN HFA) 108 (90 Base) MCG/ACT inhaler INHALE 2 PUFFS INTO THE LUNGS EVERY 6 (SIX) HOURS AS NEEDED FOR WHEEZING OR SHORTNESS OF BREATH.  Marland Kitchen aspirin 81 MG EC tablet Take 1 tablet (81 mg total) by mouth daily. Swallow whole.  . Cholecalciferol (VITAMIN D3) 5000 units CAPS Take 1 capsule by mouth daily.  Marland Kitchen donepezil (ARICEPT) 5 MG tablet TAKE 1 TABLET AT BEDTIME  . ezetimibe (ZETIA) 10 MG tablet TAKE 1 TABLET AT BEDTIME  . fluticasone (FLONASE) 50 MCG/ACT nasal spray Place 2 sprays into both nostrils daily.  . hydrochlorothiazide (HYDRODIURIL) 25 MG tablet TAKE 1 TABLET EVERY DAY  . Loperamide HCl (IMODIUM A-D PO) Take 1 tablet by mouth daily as needed.  . memantine (NAMENDA) 5 MG tablet Take 1 tablet (5 mg total) by mouth 2 (two) times daily.  . metoprolol succinate (TOPROL-XL) 100 MG 24 hr tablet TAKE 1 TABLET EVERY DAY  . nystatin-triamcinolone ointment (MYCOLOG) Apply 1 application  topically 2 (two) times daily.  Marland Kitchen omeprazole (PRILOSEC) 40 MG capsule TAKE 1 CAPSULE EVERY DAY  . potassium chloride (K-DUR) 10 MEQ tablet TAKE 1 TABLET EVERY DAY  . ramipril (ALTACE) 10 MG capsule TAKE 1 CAPSULE EVERY DAY  . rosuvastatin (CRESTOR) 20 MG tablet TAKE 1 TABLET AT BEDTIME  . Vitamin D, Ergocalciferol, (DRISDOL) 1.25 MG (50000 UT) CAPS capsule TAKE 1 CAPSULE EVERY 7 DAYS.  . [DISCONTINUED] nystatin-triamcinolone ointment (MYCOLOG) Apply 1 application topically 2 (two) times daily.  . [DISCONTINUED] nystatin-triamcinolone ointment (MYCOLOG) Apply 1 application topically 2 (  two) times daily.  Vanessa Kick Ethyl 1 g CAPS Take 2 capsules (2 g total) by mouth 2 (two) times daily. (Patient not taking: Reported on 09/21/2018)   No facility-administered encounter medications on file as of 09/21/2018.     Allergies  Allergen Reactions  . Ampicillin Nausea Only  . Erythromycin Other (See Comments)    indigestion    Review of Systems  Constitutional: Negative for activity change, appetite change, chills, diaphoresis, fatigue, fever and unexpected weight change.  HENT: Positive for hearing loss (chronic).   Eyes: Negative.  Negative for photophobia and visual disturbance.  Respiratory: Negative for cough, chest tightness and shortness of breath.   Cardiovascular: Negative for chest pain, palpitations and leg swelling.  Gastrointestinal: Negative for abdominal distention, abdominal pain, anal bleeding, blood in stool, constipation, diarrhea, nausea, rectal pain and vomiting.  Endocrine: Negative.  Negative for cold intolerance, heat intolerance, polydipsia, polyphagia and polyuria.  Genitourinary: Negative for decreased urine volume, difficulty urinating, discharge, dysuria, frequency, hematuria, penile pain, penile swelling, scrotal swelling, testicular pain and urgency.  Musculoskeletal: Negative for arthralgias and myalgias.  Skin: Positive for rash (groin).  Allergic/Immunologic:  Negative.   Neurological: Negative for dizziness and headaches.  Hematological: Negative.   Psychiatric/Behavioral: Negative for confusion, hallucinations, sleep disturbance and suicidal ideas.  All other systems reviewed and are negative.       Objective:  BP 130/83   Pulse 74   Temp 98 F (36.7 C) (Oral)   Ht '6\' 1"'  (1.854 m)   Wt 191 lb (86.6 kg)   BMI 25.20 kg/m    Wt Readings from Last 3 Encounters:  09/21/18 191 lb (86.6 kg)  05/01/18 191 lb (86.6 kg)  04/24/18 189 lb (85.7 kg)    Physical Exam Vitals signs and nursing note reviewed.  Constitutional:      General: He is not in acute distress.    Appearance: Normal appearance. He is well-developed and well-groomed. He is not ill-appearing, toxic-appearing or diaphoretic.  HENT:     Head: Normocephalic and atraumatic.     Jaw: There is normal jaw occlusion.     Right Ear: Ear canal and external ear normal. Decreased hearing noted. Tympanic membrane is scarred.     Left Ear: Ear canal and external ear normal. Decreased hearing noted. Tympanic membrane is scarred.     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 Wears bilateral hearing aids that transfer all sound to left ear.     Nose: Nose normal.     Mouth/Throat:     Lips: Pink.     Mouth: Mucous membranes are moist.     Pharynx: Oropharynx is clear. Uvula midline.  Eyes:     General: Lids are normal.     Extraocular Movements: Extraocular movements intact.     Conjunctiva/sclera: Conjunctivae normal.     Pupils: Pupils are equal, round, and reactive to light.  Neck:     Musculoskeletal: Normal range of motion and neck supple.     Thyroid: No thyroid mass, thyromegaly or thyroid tenderness.     Vascular: No carotid bruit or JVD.     Trachea: Trachea and phonation normal.  Cardiovascular:     Rate and Rhythm: Normal rate and regular rhythm.     Chest Wall: PMI is not displaced.      Pulses: Normal pulses.     Heart sounds: Normal heart sounds. No murmur. No friction rub.  No gallop.   Pulmonary:     Effort: Pulmonary effort is normal. No respiratory distress.     Breath sounds: Normal breath sounds. No wheezing.  Abdominal:     General: Bowel sounds are normal. There is no distension or abdominal bruit.     Palpations: Abdomen is soft. There is no hepatomegaly or splenomegaly.     Tenderness: There is no abdominal tenderness. There is no right CVA tenderness or left CVA tenderness.     Hernia: No hernia is present.  Genitourinary:    Pubic Area: Rash (bilateral groin rash, beefy red raised rash with satellite pustules) present.  Musculoskeletal: Normal range of motion.     Right lower leg: No edema.     Left lower leg: No edema.  Lymphadenopathy:     Cervical: No cervical adenopathy.  Skin:    General: Skin is warm and dry.     Capillary Refill: Capillary refill takes less than 2 seconds.     Coloration: Skin is not cyanotic, jaundiced or pale.     Findings: No rash.  Neurological:     General: No focal deficit present.     Mental Status: He is alert and oriented to person, place, and time.     Cranial Nerves: Cranial nerves are intact.     Sensory: Sensation is intact.     Motor: Motor function is intact.     Coordination: Coordination is intact.     Gait: Gait is intact.     Deep Tendon Reflexes: Reflexes are normal and symmetric.  Psychiatric:        Attention and Perception: Attention and perception normal.        Mood and Affect: Mood and affect normal.        Speech: Speech normal.        Behavior: Behavior normal. Behavior is cooperative.        Thought Content: Thought content normal.        Cognition and Memory: Cognition and memory normal.        Judgment: Judgment normal.     Results for orders placed or performed in visit on 09/21/18  CMP14+EGFR  Result Value Ref Range   Glucose WILL FOLLOW    BUN WILL FOLLOW    Creatinine, Ser WILL  FOLLOW    GFR calc non Af Amer WILL FOLLOW    GFR calc Af Amer WILL FOLLOW    BUN/Creatinine Ratio WILL FOLLOW    Sodium WILL FOLLOW    Potassium WILL FOLLOW    Chloride WILL FOLLOW    CO2 WILL FOLLOW    Calcium WILL FOLLOW    Total Protein WILL FOLLOW    Albumin WILL FOLLOW    Globulin, Total WILL FOLLOW    Albumin/Globulin Ratio WILL FOLLOW    Bilirubin Total WILL FOLLOW    Alkaline Phosphatase WILL FOLLOW    AST WILL FOLLOW    ALT WILL FOLLOW   CBC with Differential/Platelet  Result Value Ref Range   WBC 7.3 3.4 - 10.8 x10E3/uL   RBC 5.14 4.14 - 5.80 x10E6/uL   Hemoglobin 15.8 13.0 - 17.7 g/dL   Hematocrit 47.5 37.5 - 51.0 %   MCV 92 79 - 97 fL   MCH 30.7 26.6 - 33.0 pg   MCHC 33.3 31.5 - 35.7 g/dL   RDW 12.1 11.6 - 15.4 %   Platelets 228 150 - 450 x10E3/uL   Neutrophils 64 Not Estab. %   Lymphs 27 Not Estab. %  Monocytes 7 Not Estab. %   Eos 1 Not Estab. %   Basos 1 Not Estab. %   Neutrophils Absolute 4.7 1.4 - 7.0 x10E3/uL   Lymphocytes Absolute 1.9 0.7 - 3.1 x10E3/uL   Monocytes Absolute 0.5 0.1 - 0.9 x10E3/uL   EOS (ABSOLUTE) 0.1 0.0 - 0.4 x10E3/uL   Basophils Absolute 0.1 0.0 - 0.2 x10E3/uL   Immature Granulocytes 0 Not Estab. %   Immature Grans (Abs) 0.0 0.0 - 0.1 x10E3/uL  Lipid panel  Result Value Ref Range   Cholesterol, Total WILL FOLLOW    Triglycerides WILL FOLLOW    HDL WILL FOLLOW    VLDL Cholesterol Cal WILL FOLLOW    LDL Calculated WILL FOLLOW    Comment: WILL FOLLOW    Chol/HDL Ratio WILL FOLLOW   Thyroid Panel With TSH  Result Value Ref Range   TSH WILL FOLLOW    T4, Total WILL FOLLOW    T3 Uptake Ratio WILL FOLLOW    Free Thyroxine Index WILL FOLLOW   VITAMIN D 25 Hydroxy (Vit-D Deficiency, Fractures)  Result Value Ref Range   Vit D, 25-Hydroxy WILL FOLLOW   PSA, total and free  Result Value Ref Range   Prostate Specific Ag, Serum WILL FOLLOW    PSA, Free WILL FOLLOW    PSA, Free Pct WILL FOLLOW        Pertinent labs & imaging  results that were available during my care of the patient were reviewed by me and considered in my medical decision making.  Assessment & Plan:  Rayhan was seen today for medical management of chronic issues.  Diagnoses and all orders for this visit:  Benign essential hypertension -Daily blood pressure log given with instructions on how to fill out and told to bring to next visit -Dash diet information given -Exercise encouraged - Stress Management  -Continue current meds, labs pending.  -     CMP14+EGFR -     CBC with Differential/Platelet -     Lipid panel -     Thyroid Panel With TSH  Rash of groin Beefy red, pruritic rash with satellite pustules, likely Candidal rash of groin. Symptomatic care discussed. Medications as prescribed. Report any new or worsening symptoms.  -     nystatin-triamcinolone ointment (MYCOLOG); Apply 1 application topically 2 (two) times daily.  Aortic atherosclerosis ([facility]) Recommended ASA 81 mg daily. On statin therapy.   Gastroesophageal reflux disease without esophagitis Well controlled on current regiment. Labs pending. Report any new or worsening symptoms.  -     CBC with Differential/Platelet  Mixed conductive and sensorineural hearing loss of both ears Chronic. Followed by audiology and ENT at [facility].   Mixed hyperlipidemia Continue medications as prescribed. Labs pending. Diet and exercise encouraged.   Vitamin D deficiency Continue repletion therapy. Labs pending.  -     VITAMIN D 25 Hydroxy (Vit-D Deficiency, Fractures)  Screening for prostate cancer Last PSA elevated. Will repeat today.  -     PSA, total and free   Pt declined Td immunization due to cost.   Continue all other maintenance medications.  Follow up plan: Return in about 3 months (around 12/22/2018), or if symptoms worsen or fail to improve.  Educational handout given for survey, COVID-19  The above assessment and management plan was discussed with the patient. The  patient verbalized understanding of and has agreed to the management plan. Patient is aware to call the clinic if symptoms persist or worsen. Patient is aware when  to return to the clinic for a follow-up visit. Patient educated on when it is appropriate to go to the emergency department.   Monia Pouch, FNP-C [facility] [phone] 09/21/18

## 2018-09-22 LAB — VITAMIN D 25 HYDROXY (VIT D DEFICIENCY, FRACTURES): Vit D, 25-Hydroxy: 64.8 ng/mL (ref 30.0–100.0)

## 2018-09-22 LAB — CBC WITH DIFFERENTIAL/PLATELET
Basophils Absolute: 0.1 10*3/uL (ref 0.0–0.2)
Basos: 1 %
EOS (ABSOLUTE): 0.1 10*3/uL (ref 0.0–0.4)
Eos: 1 %
Hematocrit: 47.5 % (ref 37.5–51.0)
Hemoglobin: 15.8 g/dL (ref 13.0–17.7)
Immature Grans (Abs): 0 10*3/uL (ref 0.0–0.1)
Immature Granulocytes: 0 %
Lymphocytes Absolute: 1.9 10*3/uL (ref 0.7–3.1)
Lymphs: 27 %
MCH: 30.7 pg (ref 26.6–33.0)
MCHC: 33.3 g/dL (ref 31.5–35.7)
MCV: 92 fL (ref 79–97)
Monocytes Absolute: 0.5 10*3/uL (ref 0.1–0.9)
Monocytes: 7 %
Neutrophils Absolute: 4.7 10*3/uL (ref 1.4–7.0)
Neutrophils: 64 %
Platelets: 228 10*3/uL (ref 150–450)
RBC: 5.14 x10E6/uL (ref 4.14–5.80)
RDW: 12.1 % (ref 11.6–15.4)
WBC: 7.3 10*3/uL (ref 3.4–10.8)

## 2018-09-22 LAB — CMP14+EGFR
ALT: 24 IU/L (ref 0–44)
AST: 23 IU/L (ref 0–40)
Albumin/Globulin Ratio: 2.1 (ref 1.2–2.2)
Albumin: 4.6 g/dL (ref 3.7–4.7)
Alkaline Phosphatase: 64 IU/L (ref 39–117)
BUN/Creatinine Ratio: 12 (ref 10–24)
BUN: 12 mg/dL (ref 8–27)
Bilirubin Total: 1 mg/dL (ref 0.0–1.2)
CO2: 22 mmol/L (ref 20–29)
Calcium: 10.1 mg/dL (ref 8.6–10.2)
Chloride: 100 mmol/L (ref 96–106)
Creatinine, Ser: 1.04 mg/dL (ref 0.76–1.27)
GFR calc Af Amer: 82 mL/min/{1.73_m2} (ref >59)
GFR calc non Af Amer: 71 mL/min/{1.73_m2} (ref >59)
Globulin, Total: 2.2 g/dL (ref 1.5–4.5)
Glucose: 93 mg/dL (ref 65–99)
Potassium: 4.4 mmol/L (ref 3.5–5.2)
Sodium: 140 mmol/L (ref 134–144)
Total Protein: 6.8 g/dL (ref 6.0–8.5)

## 2018-09-22 LAB — LIPID PANEL
Chol/HDL Ratio: 3.5 ratio (ref 0.0–5.0)
Cholesterol, Total: 134 mg/dL (ref 100–199)
HDL: 38 mg/dL — ABNORMAL LOW (ref >39)
LDL Calculated: 62 mg/dL (ref 0–99)
Triglycerides: 170 mg/dL — ABNORMAL HIGH (ref 0–149)
VLDL Cholesterol Cal: 34 mg/dL (ref 5–40)

## 2018-09-22 LAB — THYROID PANEL WITH TSH
Free Thyroxine Index: 2.2 (ref 1.2–4.9)
T3 Uptake Ratio: 28 % (ref 24–39)
T4, Total: 7.9 ug/dL (ref 4.5–12.0)
TSH: 1.45 u[IU]/mL (ref 0.450–4.500)

## 2018-09-22 LAB — PSA, TOTAL AND FREE
PSA, Free Pct: 26.3 %
PSA, Free: 0.63 ng/mL
Prostate Specific Ag, Serum: 2.4 ng/mL (ref 0.0–4.0)

## 2018-09-25 ENCOUNTER — Other Ambulatory Visit: Payer: Self-pay | Admitting: Family Medicine

## 2018-09-25 ENCOUNTER — Telehealth: Payer: Self-pay | Admitting: Family Medicine

## 2018-09-25 NOTE — Telephone Encounter (Signed)
Pt aware, he has received call from [facility] right after he called here

## 2018-09-25 NOTE — Telephone Encounter (Signed)
Baxter Flattery called and aware of fathers labs

## 2018-09-26 ENCOUNTER — Other Ambulatory Visit: Payer: Self-pay | Admitting: Family Medicine

## 2018-10-02 ENCOUNTER — Telehealth: Payer: Self-pay | Admitting: Family Medicine

## 2018-10-02 MED ORDER — MEMANTINE HCL 5 MG PO TABS: 5.0000 mg | ORAL_TABLET | Freq: Two times a day (BID) | ORAL | 0 refills | Status: DC

## 2018-10-02 NOTE — Telephone Encounter (Signed)
What is the name of the medication? memantine (NAMENDA) 5 MG tablet   Have you contacted your pharmacy to request a refill? yes  Which pharmacy would you like this sent to? [facility] [place]   Patient notified that their request is being sent to the clinical staff for review and that they should receive a call once it is complete. If they do not receive a call within 24 hours they can check with their pharmacy or our office.

## 2018-10-02 NOTE — Telephone Encounter (Signed)
Pt aware refill sent to pharmacy 

## 2018-10-08 ENCOUNTER — Telehealth: Payer: Self-pay | Admitting: Family Medicine

## 2018-10-08 NOTE — Chronic Care Management (AMB) (Signed)
Chronic Care Management   Note  10/08/2018 Name: Maurice Weaver MRN: [id number] DOB: 29-Sep-1945  Maurice Weaver is a 73 y.o. year old male who is a primary care patient of Rakes, Connye Burkitt, FNP. I reached out to Irine Seal by phone today in response to a referral sent by Maurice Weaver's health plan.    [name] was given information about Chronic Care Management services today including:  1. CCM service includes personalized support from designated clinical staff supervised by his physician, including individualized plan of care and coordination with other care providers 2. 24/7 contact phone numbers for assistance for urgent and routine care needs. 3. Service will only be billed when office clinical staff spend 20 minutes or more in a month to coordinate care. 4. Only one practitioner may furnish and bill the service in a calendar month. 5. The patient may stop CCM services at any time (effective at the end of the month) by phone call to the office staff. 6. The patient will be responsible for cost sharing (co-pay) of up to 20% of the service fee (after annual deductible is met).  Patient did not agree to enrollment in care management services and does not wish to consider at this time.  Follow up plan: The patient has been provided with contact information for the chronic care management team and has been advised to call with any health related questions or concerns.   Warba  [email or web address]   ??[phone]

## 2018-10-10 ENCOUNTER — Other Ambulatory Visit: Payer: Self-pay | Admitting: Family Medicine

## 2018-10-29 ENCOUNTER — Other Ambulatory Visit: Payer: Self-pay | Admitting: Family Medicine

## 2018-11-29 ENCOUNTER — Ambulatory Visit (INDEPENDENT_AMBULATORY_CARE_PROVIDER_SITE_OTHER): Payer: Medicare HMO

## 2018-11-29 ENCOUNTER — Other Ambulatory Visit: Payer: Self-pay

## 2018-11-29 DIAGNOSIS — Z23 Encounter for immunization: Secondary | ICD-10-CM

## 2018-12-12 ENCOUNTER — Other Ambulatory Visit: Payer: Self-pay | Admitting: Family Medicine

## 2018-12-12 DIAGNOSIS — R21 Rash and other nonspecific skin eruption: Secondary | ICD-10-CM

## 2018-12-28 IMAGING — CT CT TEMPORAL BONES W/O CM
1 series · 15 of 29 positions shown, 19 images · non-contrast
Comparison: CT 09/06/2013, 11/24/2009

CLINICAL DATA: Cholesteatoma left ear.  Bilateral mastoidectomy.

EXAM:
CT TEMPORAL BONES WITHOUT CONTRAST
TECHNIQUE: Axial and coronal plane CT imaging of the petrous temporal bones was
performed with thin-collimation image reconstruction. No intravenous
contrast was administered. Multiplanar CT image reconstructions were
also generated.

[Series 3: brain · axial · 0.39mm/px · z∈[-355,-303]mm · 15 of 29 slices shown, 19 images]
[im 2/29  brain]
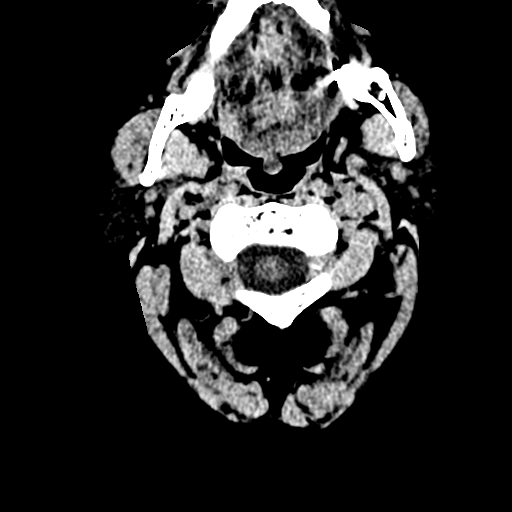
[im 2/29  bone]
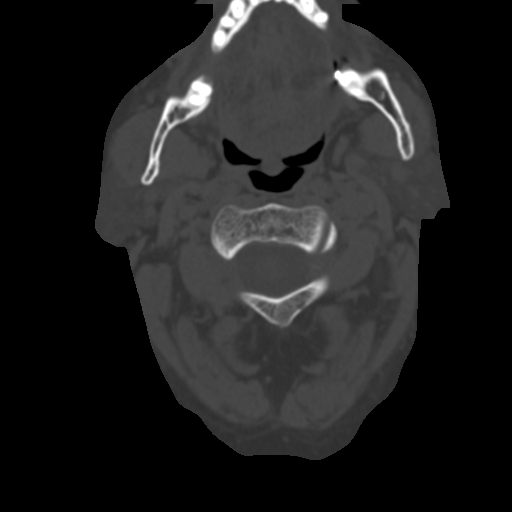
[im 4/29  bone]
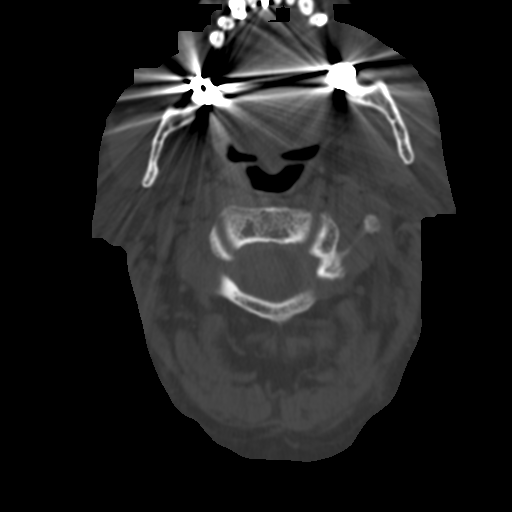
[im 6/29  bone]
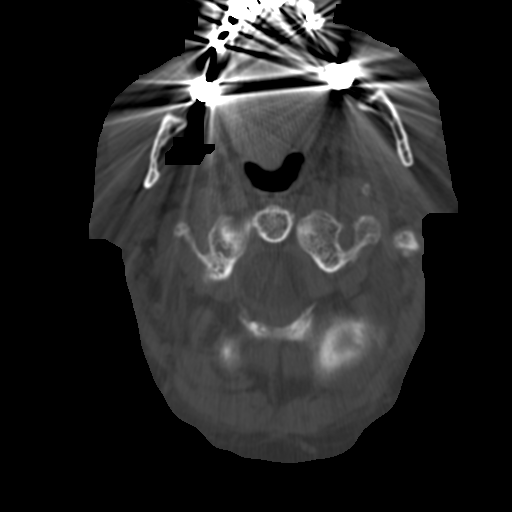
[im 8/29  bone]
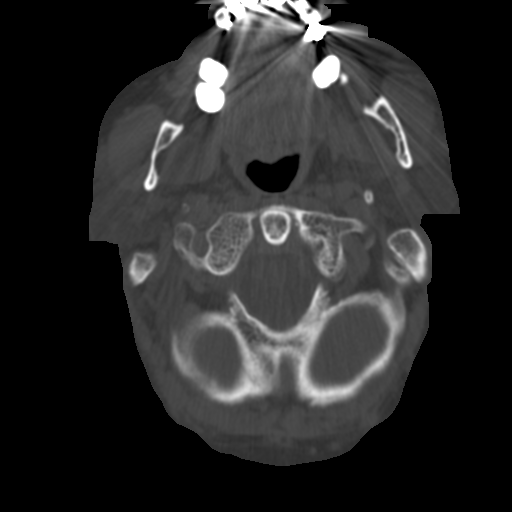
[im 10/29  brain]
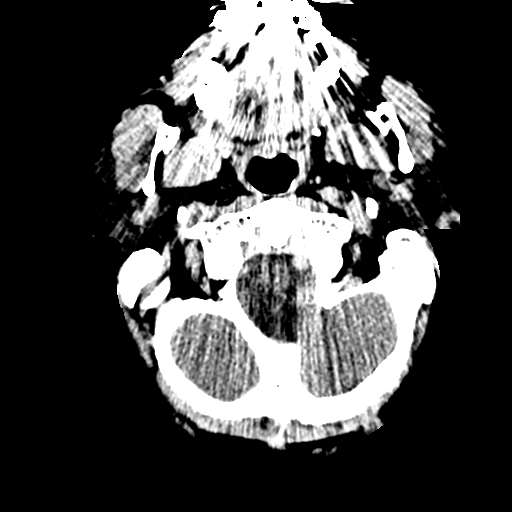
[im 10/29  bone]
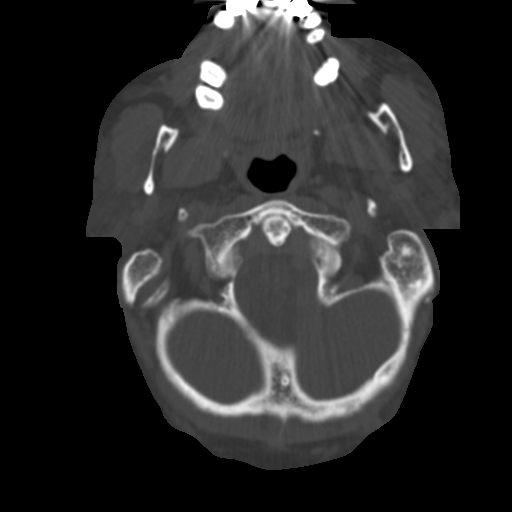
[im 11/29  bone]
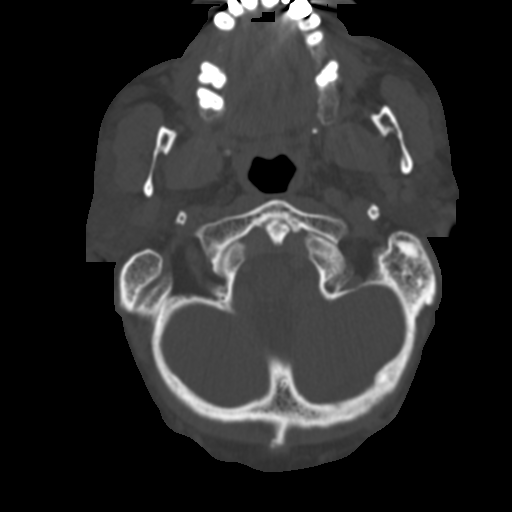
[im 13/29  bone]
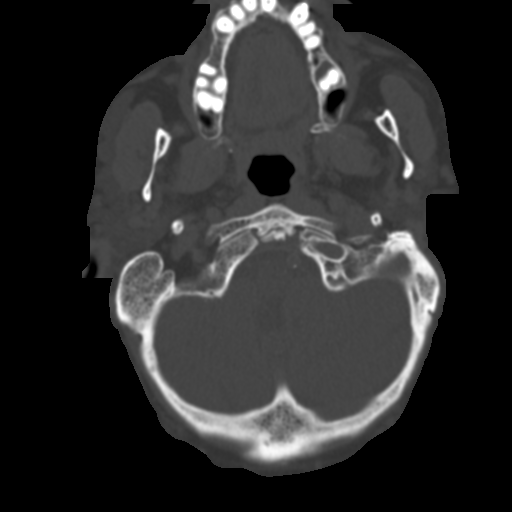
[im 15/29  bone]
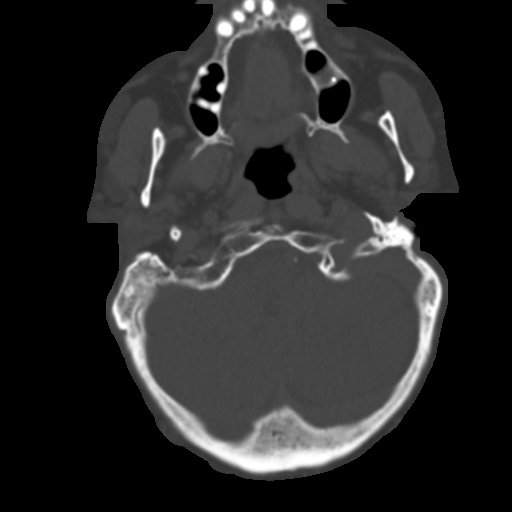
[im 17/29  brain]
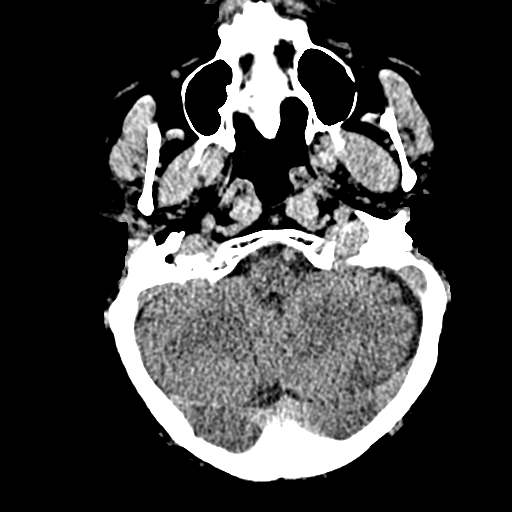
[im 17/29  bone]
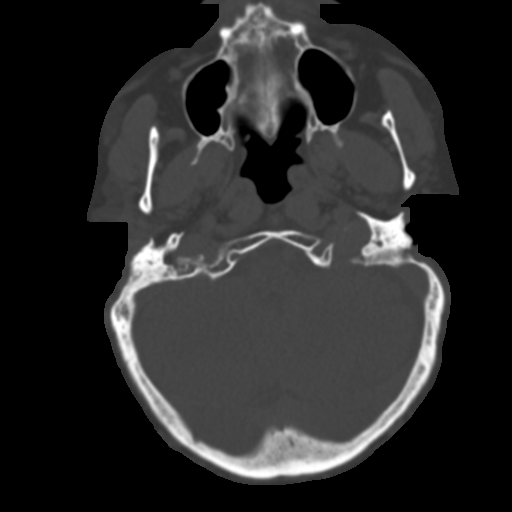
[im 19/29  bone]
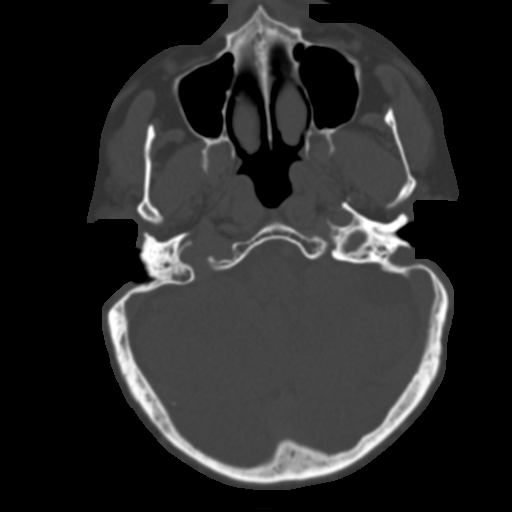
[im 20/29  bone]
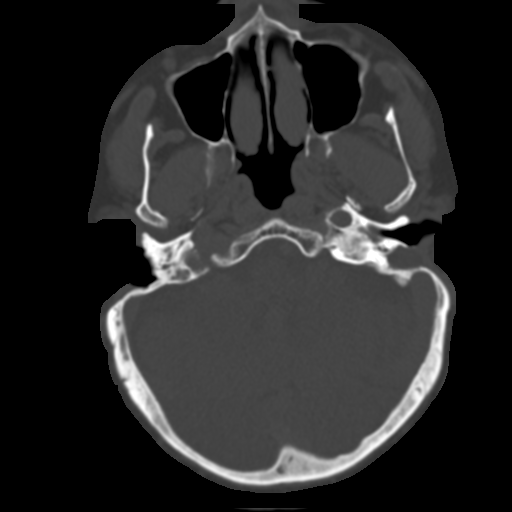
[im 22/29  bone]
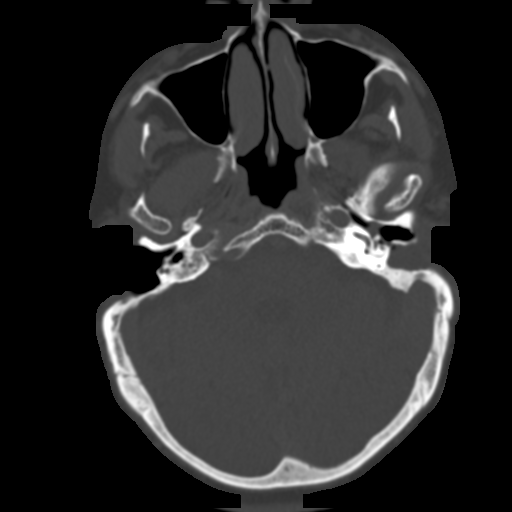
[im 24/29  brain]
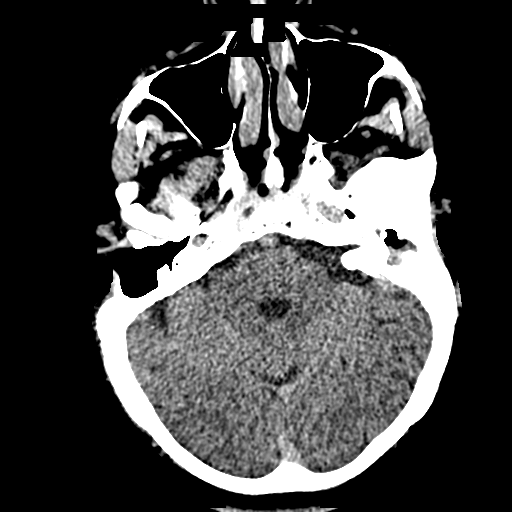
[im 24/29  bone]
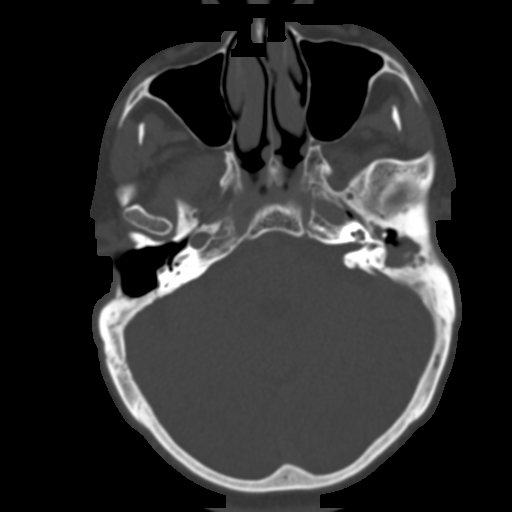
[im 26/29  bone]
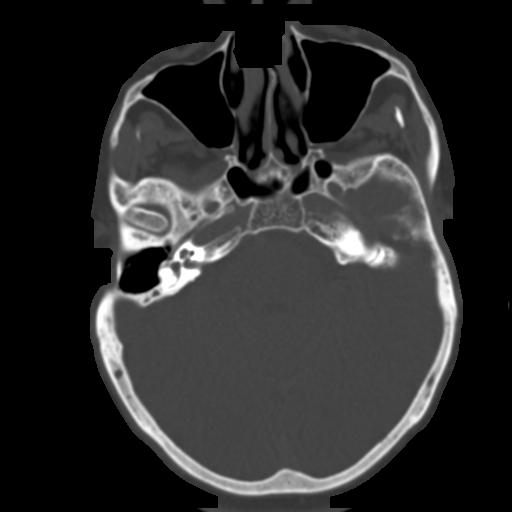
[im 28/29  bone]
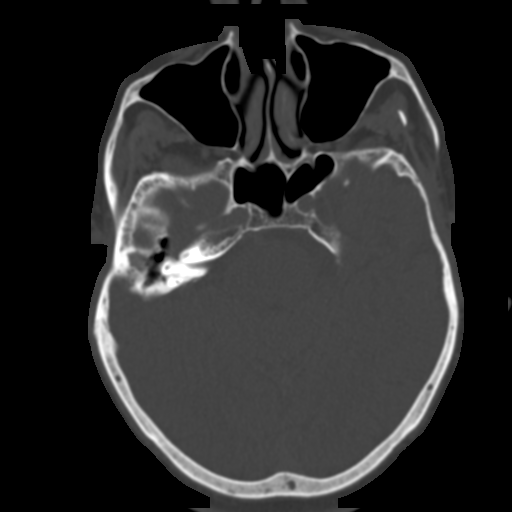

[15 of 29 positions shown; findings below may reference images not displayed]

FINDINGS: Visualized paranasal sinuses clear. Limited intracranial imaging
negative

Right temporal bone: Right mastoidectomy. Mastoidectomy cavity
remains well aerated and clear. There is soft tissue in the roof of
the mastoidectomy cavity unchanged since 5500 possibly scarring or
tegmen repair. The tegmen is irregular and deficient unchanged from
prior studies. Ossicles have been removed with implant. Tympanic
membrane is thickened. Middle ear is clear. Cochlea and semi
circular canals normal.

Left temporal bone: Left mastoidectomy cavity. Extensive soft tissue
is present in the mastoidectomy cavity posteriorly and superiorly
unchanged from 1913 but progressive since 5500. Myringotomy tube has
been removed. Apparent ossicle reconstruction is unchanged.
Progressive opacification of the middle ear. Extensive defect in the
tegmen unchanged. Question prior tegmen repair. Cochlea and semi
circular canals normal. Internal auditory canal normal.
IMPRESSION: Right mastoidectomy cavity remains clear. Tegmen defect with soft
tissue in the roof of the mastoidectomy may be related to prior
tegmen repair. Ossicle reconstruction on the right

Left mastoidectomy cavity remains largely opacified and unchanged.
Progressive middle ear fluid or soft tissue. Myringotomy tube has
been removed on the left. Presumed ossicle reconstruction. Extensive
tegmen defect on the left unchanged.

## 2019-01-07 ENCOUNTER — Other Ambulatory Visit: Payer: Self-pay | Admitting: Family Medicine

## 2019-01-07 MED ORDER — MEMANTINE HCL 5 MG PO TABS: 5.0000 mg | ORAL_TABLET | Freq: Two times a day (BID) | ORAL | 2 refills | Status: DC

## 2019-01-15 ENCOUNTER — Telehealth: Payer: Self-pay | Admitting: Family Medicine

## 2019-01-15 MED ORDER — MEMANTINE HCL 5 MG PO TABS: 5.0000 mg | ORAL_TABLET | Freq: Two times a day (BID) | ORAL | 2 refills | Status: AC

## 2019-01-15 NOTE — Telephone Encounter (Signed)
meds sent in

## 2019-01-31 ENCOUNTER — Other Ambulatory Visit: Payer: Self-pay | Admitting: Family Medicine

## 2019-02-05 DIAGNOSIS — Z8669 Personal history of other diseases of the nervous system and sense organs: Secondary | ICD-10-CM | POA: Insufficient documentation

## 2019-02-05 DIAGNOSIS — H906 Mixed conductive and sensorineural hearing loss, bilateral: Secondary | ICD-10-CM | POA: Diagnosis not present

## 2019-02-18 ENCOUNTER — Other Ambulatory Visit: Payer: Self-pay | Admitting: Family Medicine

## 2019-03-01 ENCOUNTER — Other Ambulatory Visit: Payer: Self-pay | Admitting: Family Medicine

## 2019-03-01 DIAGNOSIS — R21 Rash and other nonspecific skin eruption: Secondary | ICD-10-CM

## 2019-03-04 ENCOUNTER — Other Ambulatory Visit: Payer: Self-pay | Admitting: Family Medicine

## 2019-03-13 ENCOUNTER — Other Ambulatory Visit: Payer: Self-pay | Admitting: Family Medicine

## 2019-03-14 NOTE — Telephone Encounter (Signed)
Rakes. NTBS was to be seen in Oct. Mail order not sent

## 2019-03-14 NOTE — Telephone Encounter (Signed)
Attempted to contact patient - NA °

## 2019-03-20 DIAGNOSIS — Z23 Encounter for immunization: Secondary | ICD-10-CM | POA: Diagnosis not present

## 2019-03-28 ENCOUNTER — Telehealth: Payer: Self-pay | Admitting: *Deleted

## 2019-03-28 NOTE — Telephone Encounter (Signed)
A message was left, re: his follow up visit with Dr.Hochrein.

## 2019-04-23 ENCOUNTER — Other Ambulatory Visit: Payer: Self-pay | Admitting: Family Medicine

## 2019-04-24 ENCOUNTER — Ambulatory Visit (INDEPENDENT_AMBULATORY_CARE_PROVIDER_SITE_OTHER): Payer: Medicare HMO | Admitting: Cardiology

## 2019-04-24 ENCOUNTER — Other Ambulatory Visit: Payer: Self-pay

## 2019-04-24 ENCOUNTER — Encounter: Payer: Self-pay | Admitting: Cardiology

## 2019-04-24 VITALS — BP 120/80 | HR 74 | Ht 73.0 in | Wt 188.0 lb

## 2019-04-24 DIAGNOSIS — I2510 Atherosclerotic heart disease of native coronary artery without angina pectoris: Secondary | ICD-10-CM | POA: Diagnosis not present

## 2019-04-24 DIAGNOSIS — I10 Essential (primary) hypertension: Secondary | ICD-10-CM | POA: Diagnosis not present

## 2019-04-24 NOTE — Patient Instructions (Signed)
Medication Instructions:  The current medical regimen is effective;  continue present plan and medications.  *If you need a refill on your cardiac medications before your next appointment, please call your pharmacy*  Follow-Up: At [facility], you and your health needs are our priority.  As part of our continuing mission to provide you with exceptional heart care, we have created designated Provider Care Teams.  These Care Teams include your primary Cardiologist (physician) and Advanced Practice Providers (APPs -  Physician Assistants and Nurse Practitioners) who all work together to provide you with the care you need, when you need it.  Your next appointment:   2 year(s)  The format for your next appointment:   In Person  Provider:   Minus Breeding, MD  Thank you for choosing [facility]!!

## 2019-04-24 NOTE — Progress Notes (Signed)
Cardiology Office Note   Date:  04/24/2019   ID:  Maurice Weaver, Maurice Weaver May 17, 1945, MRN [id number]  PCP:  Baruch Gouty, FNP  Cardiologist:   Minus Breeding, MD  Referring:  Baruch Gouty, FNP  Chief Complaint  Patient presents with  . Coronary Artery Disease      History of Present Illness: Maurice Weaver is a 74 y.o. male who presents for of CAD with a PCI in the late 90s by Dr. Olevia Perches.   In 2015 he had a POET (Plain Old Exercise Treadmill) which he passed without any evidence of ischemia.  Since I last saw him he has done well.  He walks his small dog daily. The patient denies any new symptoms such as chest discomfort, neck or arm discomfort. There has been no new shortness of breath, PND or orthopnea. There have been no reported palpitations, presyncope or syncope.  The only time he gets breathless is sometimes when he is talking and he has trouble completing a sentence.   Past Medical History:  Diagnosis Date  . AF (atrial fibrillation) ([place])   . Asthma    as a child. stable  . CAD (coronary artery disease)   . Cholesteatoma   . Decreased hearing   . Diverticulitis of colon    recurrent w/abscess - resected 2011  . Diverticulosis   . ED (erectile dysfunction)   . Fundic gland polyps of stomach, benign   . GERD (gastroesophageal reflux disease)   . Hiatal hernia   . Hyperlipidemia   . Hypertension   . Melanoma ([place]) 2013   Right Cheek  . MRSA (methicillin resistant staph aureus) culture positive 07/2009  . Nephrolithiasis   . S/P angioplasty   . Schatzki's ring   . Vitamin D deficiency     Past Surgical History:  Procedure Laterality Date  . COLON SURGERY  2011   diverticulitis left resection-Toth  . COLONOSCOPY  Multiple  . CORONARY ANGIOPLASTY WITH STENT PLACEMENT  2000  . ESOPHAGOGASTRODUODENOSCOPY  Multiple  . INNER EAR SURGERY Bilateral 2015   x6  cholesteatomas  . MELANOMA EXCISION  2012  . TONSILLECTOMY AND ADENOIDECTOMY       Current  Outpatient Medications  Medication Sig Dispense Refill  . albuterol (PROVENTIL HFA;VENTOLIN HFA) 108 (90 Base) MCG/ACT inhaler INHALE 2 PUFFS INTO THE LUNGS EVERY 6 (SIX) HOURS AS NEEDED FOR WHEEZING OR SHORTNESS OF BREATH. 54 g 1  . Cholecalciferol (VITAMIN D3) 5000 units CAPS Take 1 capsule by mouth daily.    Marland Kitchen donepezil (ARICEPT) 5 MG tablet TAKE 1 TABLET AT BEDTIME 90 tablet 0  . ezetimibe (ZETIA) 10 MG tablet TAKE 1 TABLET AT BEDTIME 90 tablet 0  . fluticasone (FLONASE) 50 MCG/ACT nasal spray Place 2 sprays into both nostrils daily. 48 g 3  . hydrochlorothiazide (HYDRODIURIL) 25 MG tablet TAKE 1 TABLET EVERY DAY 90 tablet 1  . memantine (NAMENDA) 5 MG tablet Take 1 tablet (5 mg total) by mouth 2 (two) times daily. 180 tablet 2  . metoprolol succinate (TOPROL-XL) 100 MG 24 hr tablet TAKE 1 TABLET EVERY DAY 90 tablet 1  . nystatin-triamcinolone ointment (MYCOLOG) APPLY TO AFFECTED AREA(S) TWICE DAILY 30 g 2  . omeprazole (PRILOSEC) 40 MG capsule TAKE 1 CAPSULE EVERY DAY 90 capsule 1  . potassium chloride (KLOR-CON) 10 MEQ tablet TAKE 1 TABLET EVERY DAY 90 tablet 0  . ramipril (ALTACE) 10 MG capsule TAKE 1 CAPSULE EVERY DAY 90 capsule 1  .  rosuvastatin (CRESTOR) 20 MG tablet TAKE 1 TABLET AT BEDTIME 90 tablet 1  . Vitamin D, Ergocalciferol, (DRISDOL) 1.25 MG (50000 UT) CAPS capsule TAKE 1 CAPSULE EVERY 7 DAYS. 12 capsule 0  . Icosapent Ethyl 1 g CAPS Take 2 capsules (2 g total) by mouth 2 (two) times daily. (Patient not taking: Reported on 09/21/2018) 120 capsule 2  . Loperamide HCl (IMODIUM A-D PO) Take 1 tablet by mouth daily as needed.     No current facility-administered medications for this visit.    Allergies:   Ampicillin and Erythromycin    ROS:  Please see the history of present illness.   Otherwise, review of systems are positive for none.   All other systems are reviewed and negative.    PHYSICAL EXAM: VS:  BP 120/80   Pulse 74   Ht 6\' 1"  (1.854 m)   Wt 188 lb (85.3 kg)    BMI 24.80 kg/m  , BMI Body mass index is 24.8 kg/m.  GENERAL:  Well appearing NECK:  No jugular venous distention, waveform within normal limits, carotid upstroke brisk and symmetric, no bruits, no thyromegaly LUNGS:  Clear to auscultation bilaterally CHEST: Unremarkable  HEART:  PMI not displaced or sustained,S1 and S2 within normal limits, no S3, no S4, no clicks, no rubs, no murmurs ABD:  Flat, positive bowel sounds normal in frequency in pitch, no bruits, no rebound, no guarding, no midline pulsatile mass, no hepatomegaly, no splenomegaly EXT:  2 plus pulses throughout, no edema, no cyanosis no clubbing   EKG:  EKG is ordered today. The ekg ordered today demonstrates sinus rhythm, rate 74, axis within normal limits, intervals within normal limits, no acute ST-T wave changes.   Recent Labs: 09/21/2018: ALT 24; BUN 12; Creatinine, Ser 1.04; Hemoglobin 15.8; Platelets 228; Potassium 4.4; Sodium 140; TSH 1.450    Lipid Panel    Component Value Date/Time   CHOL 134 09/21/2018 1139   CHOL 120 06/13/2012 1011   TRIG 170 (H) 09/21/2018 1139   TRIG 234 (H) 06/07/2016 1025   TRIG 111 06/13/2012 1011   HDL 38 (L) 09/21/2018 1139   HDL 39 (L) 06/07/2016 1025   HDL 35 (L) 06/13/2012 1011   CHOLHDL 3.5 09/21/2018 1139   LDLCALC 62 09/21/2018 1139   LDLCALC 71 09/10/2013 0934   LDLCALC 63 06/13/2012 1011      Wt Readings from Last 3 Encounters:  04/24/19 188 lb (85.3 kg)  09/21/18 191 lb (86.6 kg)  05/01/18 191 lb (86.6 kg)      Other studies Reviewed: Additional studies/ records that were reviewed today include:  Labs Review of the above records demonstrates:  See below   ASSESSMENT AND PLAN:    CAD:  The patient has no new sypmtoms.  No further cardiovascular testing is indicated.  We will continue with aggressive risk reduction and meds as listed.  HTN:  The blood pressure is at target.  No change in therapy.  At target.  No change in therapy.   DYSLIPIDEMIA:  His LDL  was LDL was 62 with an HDL of 34.  No change in therapy.   COVID EDUCATION: He has received both vaccines.   Current medicines are reviewed at length with the patient today.  The patient does not have concerns regarding medicines.  The following changes have been made:  None  Labs/ tests ordered today include:    Orders Placed This Encounter  Procedures  . EKG 12-Lead     Disposition:  FU with me in 24 months.   Signed, Minus Breeding, MD  04/24/2019 1:43 PM    [facility]

## 2019-04-24 NOTE — Telephone Encounter (Signed)
Rakes. NTBS for 6 mos ckup. LOV 08/2018. Mail order sent

## 2019-05-02 ENCOUNTER — Ambulatory Visit (INDEPENDENT_AMBULATORY_CARE_PROVIDER_SITE_OTHER): Payer: Medicare HMO | Admitting: *Deleted

## 2019-05-02 VITALS — BP 131/83 | Wt 191.0 lb

## 2019-05-02 DIAGNOSIS — Z0000 Encounter for general adult medical examination without abnormal findings: Secondary | ICD-10-CM

## 2019-05-02 NOTE — Progress Notes (Signed)
MEDICARE ANNUAL WELLNESS VISIT  05/02/2019  Telephone Visit Disclaimer This Medicare AWV was conducted by telephone due to national recommendations for restrictions regarding the COVID-19 Pandemic (e.g. social distancing).  I verified, using two identifiers, that I am speaking with Maurice Weaver or their authorized healthcare agent. I discussed the limitations, risks, security, and privacy concerns of performing an evaluation and management service by telephone and the potential availability of an in-person appointment in the future. The patient expressed understanding and agreed to proceed.   Subjective:  Maurice Weaver is a 74 y.o. male patient of Rakes, Connye Burkitt, FNP who had a Medicare Annual Wellness Visit today via telephone. Maurice Weaver is Retired and lives alone. he has no birth children, but does have 2 step- children that help him. he reports that he is socially active and does interact with friends/family regularly. he is moderately physically active and enjoys reading, walking and playing golf.  Patient Care Team: Baruch Gouty, FNP as PCP - General (Family Medicine) Gatha Mayer, MD as Attending Physician (Gastroenterology) Melida Quitter, MD as Consulting Physician (Otolaryngology) Minus Breeding, MD as Consulting Physician (Cardiology) May, Luretha Rued, MD as Referring Physician (Otolaryngology) Melida Quitter, MD as Consulting Physician (Otolaryngology)  Advanced Directives 05/02/2019 05/01/2018 04/13/2017 09/03/2015 06/23/2015 06/23/2015 05/27/2014  Does Patient Have a Medical Advance Directive? No Yes Yes Yes - Yes Yes  Type of Advance Directive - [place];Living will Living will [place];Living will Healthcare Power of Attorney Living will Living will  Does patient want to make changes to medical advance directive? - No - Patient declined - No - Patient declined - No - Patient declined No - Patient declined  Copy of [place] in Chart? - No - copy requested - No - copy requested - No - copy requested Yes  Would patient like information on creating a medical advance directive? No - Patient declined - - - - - [facility] Utilization Over the Past 12 Months: # of hospitalizations or ER visits: 0 # of surgeries: 0  Review of Systems    Patient reports that his overall health is better compared to last year.  General ROS: negative  Patient Reported Readings (BP, Pulse, CBG, Weight, etc) BP 131/83   Wt 191 lb (86.6 kg)   BMI 25.20 kg/m    Pain Assessment       Current Medications & Allergies (verified) Allergies as of 05/02/2019      Reactions   Ampicillin Nausea Only   Erythromycin Other (See Comments)   indigestion      Medication List       Accurate as of May 02, 2019 10:43 AM. If you have any questions, ask your nurse or doctor.        STOP taking these medications   icosapent Ethyl 1 g capsule Commonly known as: VASCEPA   Vitamin D (Ergocalciferol) 1.25 MG (50000 UNIT) Caps capsule Commonly known as: DRISDOL   Vitamin D3 125 MCG (5000 UT) Caps     TAKE these medications   albuterol 108 (90 Base) MCG/ACT inhaler Commonly known as: VENTOLIN HFA INHALE 2 PUFFS INTO THE LUNGS EVERY 6 (SIX) HOURS AS NEEDED FOR WHEEZING OR SHORTNESS OF BREATH.   donepezil 5 MG tablet Commonly known as: ARICEPT TAKE 1 TABLET AT BEDTIME   ezetimibe 10 MG tablet Commonly known as: ZETIA TAKE 1 TABLET AT BEDTIME   fluticasone 50 MCG/ACT nasal spray  Commonly known as: FLONASE Place 2 sprays into both nostrils daily.   hydrochlorothiazide 25 MG tablet Commonly known as: HYDRODIURIL TAKE 1 TABLET EVERY DAY   IMODIUM A-D PO Take 1 tablet by mouth daily as needed.   memantine 5 MG tablet Commonly known as: Namenda Take 1 tablet (5 mg total) by mouth 2 (two) times daily.   metoprolol succinate 100 MG 24 hr tablet Commonly known as: TOPROL-XL TAKE 1 TABLET EVERY DAY     nystatin-triamcinolone ointment Commonly known as: MYCOLOG APPLY TO AFFECTED AREA(S) TWICE DAILY   omeprazole 40 MG capsule Commonly known as: PRILOSEC TAKE 1 CAPSULE EVERY DAY   potassium chloride 10 MEQ tablet Commonly known as: KLOR-CON TAKE 1 TABLET EVERY DAY   ramipril 10 MG capsule Commonly known as: ALTACE TAKE 1 CAPSULE EVERY DAY   rosuvastatin 20 MG tablet Commonly known as: CRESTOR TAKE 1 TABLET AT BEDTIME       History (reviewed): Past Medical History:  Diagnosis Date  . AF (atrial fibrillation) ([place])   . Asthma    as a child. stable  . CAD (coronary artery disease)   . Cholesteatoma   . Decreased hearing   . Diverticulitis of colon    recurrent w/abscess - resected 2011  . Diverticulosis   . ED (erectile dysfunction)   . Fundic gland polyps of stomach, benign   . GERD (gastroesophageal reflux disease)   . Hiatal hernia   . Hyperlipidemia   . Hypertension   . Melanoma ([place]) 2013   Right Cheek  . MRSA (methicillin resistant staph aureus) culture positive 07/2009  . Nephrolithiasis   . S/P angioplasty   . Schatzki's ring   . Vitamin D deficiency    Past Surgical History:  Procedure Laterality Date  . COLON SURGERY  2011   diverticulitis left resection-Toth  . COLONOSCOPY  Multiple  . CORONARY ANGIOPLASTY WITH STENT PLACEMENT  2000  . ESOPHAGOGASTRODUODENOSCOPY  Multiple  . INNER EAR SURGERY Bilateral 2015   x6  cholesteatomas  . MELANOMA EXCISION  2012  . TONSILLECTOMY AND ADENOIDECTOMY     Family History  Problem Relation Age of Onset  . Pancreatic cancer Mother   . Hypertension Mother   . Heart disease Father        Died of MI age 7  . Heart attack Father   . Urolithiasis Brother   . Arthritis Brother   . Dementia Brother   . Alzheimer's disease Sister   . Colon cancer Neg Hx   . Esophageal cancer Neg Hx   . Rectal cancer Neg Hx   . Stomach cancer Neg Hx   . Prostate cancer Neg Hx    Social History   Socioeconomic History   . Marital status: Divorced    Spouse name: Not on file  . Number of children: 2  . Years of education: Not on file  . Highest education level: Some college, no degree  Occupational History  . Occupation: Arts development officer: [employer]  Tobacco Use  . Smoking status: Former Smoker    Packs/day: 0.50    Years: 30.00    Pack years: 15.00    Types: Cigarettes    Start date: 02/29/1960    Quit date: 04/29/1998    Years since quitting: 21.0  . Smokeless tobacco: Never Used  . Tobacco comment: Patient quit "cold [place]" in 2000  Substance and Sexual Activity  . Alcohol use: No    Alcohol/week: 0.0 standard drinks  .  Drug use: No  . Sexual activity: Not Currently  Other Topics Concern  . Not on file  Social History Narrative   Divorced and lives alone   Does have a step daughter in the area   Retired from [employer]   1 caffeine drinks daily   05/27/2015            Social Determinants of Health   Financial Resource Strain:   . Difficulty of Paying Living Expenses: Not on file  Food Insecurity:   . Worried About Charity fundraiser in the Last Year: Not on file  . Ran Out of Food in the Last Year: Not on file  Transportation Needs:   . Lack of Transportation (Medical): Not on file  . Lack of Transportation (Non-Medical): Not on file  Physical Activity:   . Days of Exercise per Week: Not on file  . Minutes of Exercise per Session: Not on file  Stress:   . Feeling of Stress : Not on file  Social Connections:   . Frequency of Communication with Friends and Family: Not on file  . Frequency of Social Gatherings with Friends and Family: Not on file  . Attends Religious Services: Not on file  . Active Member of Clubs or Organizations: Not on file  . Attends Archivist Meetings: Not on file  . Marital Status: Not on file    Activities of Daily Living In your present state of health, do you have any difficulty performing the following activities:  05/02/2019  Hearing? 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 Y  Comment wears glasses for reading  Difficulty concentrating or making decisions? N  Walking or climbing stairs? N  Dressing or bathing? N  Doing errands, shopping? N  Preparing Food and eating ? N  Using the Toilet? N  In the past six months, have you accidently leaked urine? N  Do you have problems with loss of bowel control? N  Managing your Medications? N  Managing your Finances? N  Housekeeping or managing your Housekeeping? N  Some recent data might be hidden    Patient Education/ Literacy    Exercise Current Exercise Habits: Home exercise routine, Type of exercise: walking, Time (Minutes): 30, Frequency (Times/Week): 7, Weekly Exercise (Minutes/Week): 210, Intensity: Mild  Diet Patient reports consuming 2 meals a day and 1 snack(s) a day Patient reports that his primary diet is: Regular Patient reports that she does have regular access to food.   Depression Screen PHQ 2/9 Scores 05/02/2019 09/21/2018 05/01/2018 04/24/2018 12/14/2017 08/09/2017 04/13/2017  PHQ - 2 Score 0 0 0 0 1 1 0  PHQ- 9 Score - 0 - - - - -     Fall Risk Fall Risk  05/02/2019 09/21/2018 05/01/2018 04/24/2018 12/14/2017  Falls in the past year? 0 0 0 0 No  Number falls in past yr: - - - - -  Injury with Fall? - - - - -     Objective:  Maurice Weaver seemed alert and oriented and he participated appropriately during our telephone visit.  Blood Pressure Weight BMI  BP Readings from Last 3 Encounters:  04/24/19 120/80  09/21/18 130/83  05/01/18 118/72   Wt Readings from Last 3 Encounters:  04/24/19 188 lb (85.3 kg)  09/21/18 191 lb (86.6 kg)  05/01/18 191 lb (86.6 kg)   BMI Readings from Last 1 Encounters:  04/24/19 24.80 kg/m    *Unable to obtain current  vital signs, weight, and BMI due to telephone visit type  Hearing/Vision  . Hykeem did not seem to have difficulty with hearing/understanding during the  telephone conversation . Reports that he has had a formal eye exam by an eye care professional within the past year . Reports that he has not had a formal hearing evaluation within the past year *Unable to fully assess hearing and vision during telephone visit type  Cognitive Function: 6CIT Screen 05/02/2019  What Year? 4 points  What month? 3 points  What time? 0 points  Count back from 20 0 points  Months in reverse 2 points  Repeat phrase 6 points  Total Score 15   (Normal:0-7, Significant for Dysfunction: >8)  Normal Cognitive Function Screening: Yes   Immunization & Health Maintenance Record Immunization History  Administered Date(s) Administered  . Fluad Quad(high Dose 65+) 11/29/2018  . Influenza, High Dose Seasonal PF 12/29/2014, 01/27/2016, 11/22/2016, 12/04/2017  . Influenza,inj,quad, With Preservative 01/02/2014  . Influenza-Unspecified 01/23/2013  . Pneumococcal Conjugate-13 01/23/2013  . Pneumococcal Polysaccharide-23 08/29/2010  . Tdap 06/28/2008  . Zoster 11/28/2005    Health Maintenance  Topic Date Due  . TETANUS/TDAP  05/01/2020 (Originally 06/29/2018)  . Hepatitis C Screening  06/07/2021 (Originally 1945/07/31)  . COLONOSCOPY  07/01/2025  . INFLUENZA VACCINE  Completed  . PNA vac Low Risk Adult  Completed       Assessment  This is a routine wellness examination for [employer].  Health Maintenance: Due or Overdue There are no preventive care reminders to display for this patient.  Maurice Weaver does not need a referral for Community Assistance: Care Management:   no Social Work:    no Prescription Assistance:  no Nutrition/Diabetes Education:  no   Plan:  Personalized Goals Goals Addressed            This Visit's Progress   . Exercise 150 min/wk Moderate Activity   On track    Walking and playing golf are great options.     . Increase physical activity   On track    Keep walking      Personalized Health Maintenance &  Screening Recommendations  Td vaccine  Lung Cancer Screening Recommended: no (Low Dose CT Chest recommended if Age 91-80 years, 30 pack-year currently smoking OR have quit w/in past 15 years) Hepatitis C Screening recommended: no HIV Screening recommended: no  Advanced Directives: Written information was not prepared per patient's request.  Referrals & Orders No orders of the defined types were placed in this encounter.   Follow-up Plan . Follow-up with Baruch Gouty, FNP as planned    I have personally reviewed and noted the following in the patient's chart:   . Medical and social history . Use of alcohol, tobacco or illicit drugs  . Current medications and supplements . Functional ability and status . Nutritional status . Physical activity . Advanced directives . List of other physicians . Hospitalizations, surgeries, and ER visits in previous 12 months . Vitals . Screenings to include cognitive, depression, and falls . Referrals and appointments  In addition, I have reviewed and discussed with Maurice Weaver certain preventive protocols, quality metrics, and best practice recommendations. A written personalized care plan for preventive services as well as general preventive health recommendations is available and can be mailed to the patient at his request.      [place] Dec  05/02/2019

## 2019-05-10 ENCOUNTER — Telehealth: Payer: Self-pay | Admitting: Family Medicine

## 2019-05-10 NOTE — Chronic Care Management (AMB) (Signed)
  Chronic Care Management   Outreach Note  05/10/2019 Name: JEHAN HAGEDORN MRN: [id number] DOB: [birth year]  JAAMAL SANDRA is a 74 y.o. year old male who is a primary care patient of Rakes, Connye Burkitt, FNP. I reached out to Irine Seal by phone today in response to a referral sent by Mr. Marcell Hocevar Bubb's health plan.     An unsuccessful telephone outreach was attempted today. The patient was referred to the case management team for assistance with care management and care coordination.   Follow Up Plan: A HIPPA compliant phone message was left for the patient providing contact information and requesting a return call. The care management team will reach out to the patient again over the next 7 days. If patient returns call to provider office, please advise to call Selz at [phone].  [place], [place] [address] Direct Dial: [phone] [email or web address]: [email or web address]

## 2019-05-13 NOTE — Chronic Care Management (AMB) (Signed)
  Chronic Care Management   Note  05/13/2019 Name: Maurice Weaver MRN: [id number] DOB: 22-Mar-1945  Maurice Weaver is a 74 y.o. year old male who is a primary care patient of Rakes, Connye Burkitt, FNP. I reached out to Irine Seal by phone today in response to a referral sent by Mr. Maurice Weaver's health plan.     Mr. Hessel was given information about Chronic Care Management services today including:  1. CCM service includes personalized support from designated clinical staff supervised by his physician, including individualized plan of care and coordination with other care providers 2. 24/7 contact phone numbers for assistance for urgent and routine care needs. 3. Service will only be billed when office clinical staff spend 20 minutes or more in a month to coordinate care. 4. Only one practitioner may furnish and bill the service in a calendar month. 5. The patient may stop CCM services at any time (effective at the end of the month) by phone call to the office staff. 6. The patient will be responsible for cost sharing (co-pay) of up to 20% of the service fee (after annual deductible is met).  Patient agreed to services and verbal consent obtained.   Follow up plan: Telephone appointment with care management team member scheduled for:08/26/2019.  [place], [place] [address] Direct Dial: [phone] [email or web address] Website: [email or web address]

## 2019-05-21 ENCOUNTER — Other Ambulatory Visit: Payer: Self-pay | Admitting: *Deleted

## 2019-05-21 NOTE — Telephone Encounter (Signed)
NTBS lipid & LOV 09/21/18 will need to est with another provider Rakes PCP mail order not sent

## 2019-06-07 ENCOUNTER — Other Ambulatory Visit: Payer: Self-pay | Admitting: Family Medicine

## 2019-06-07 DIAGNOSIS — R21 Rash and other nonspecific skin eruption: Secondary | ICD-10-CM

## 2019-06-12 DIAGNOSIS — F0390 Unspecified dementia without behavioral disturbance: Secondary | ICD-10-CM | POA: Diagnosis not present

## 2019-06-12 DIAGNOSIS — I10 Essential (primary) hypertension: Secondary | ICD-10-CM | POA: Diagnosis not present

## 2019-06-12 DIAGNOSIS — J45909 Unspecified asthma, uncomplicated: Secondary | ICD-10-CM | POA: Diagnosis not present

## 2019-06-12 DIAGNOSIS — K219 Gastro-esophageal reflux disease without esophagitis: Secondary | ICD-10-CM | POA: Diagnosis not present

## 2019-07-01 ENCOUNTER — Other Ambulatory Visit: Payer: Self-pay | Admitting: *Deleted

## 2019-07-01 NOTE — Telephone Encounter (Signed)
Baxter Flattery called = LM for her to call and set up appt for patient

## 2019-07-01 NOTE — Telephone Encounter (Signed)
Former Advertising account planner. NTBS LOV 09/21/18 mail order not sent

## 2019-07-01 NOTE — Addendum Note (Signed)
Addended by: Antonietta Barcelona D on: 07/01/2019 11:08 AM   Modules accepted: Orders

## 2019-08-22 ENCOUNTER — Other Ambulatory Visit: Payer: Self-pay | Admitting: *Deleted

## 2019-08-22 DIAGNOSIS — R21 Rash and other nonspecific skin eruption: Secondary | ICD-10-CM

## 2019-08-26 ENCOUNTER — Ambulatory Visit: Payer: Medicare HMO | Admitting: *Deleted

## 2019-08-26 DIAGNOSIS — I10 Essential (primary) hypertension: Secondary | ICD-10-CM

## 2019-08-26 DIAGNOSIS — E782 Mixed hyperlipidemia: Secondary | ICD-10-CM

## 2019-08-26 NOTE — Chronic Care Management (AMB) (Signed)
  Chronic Care Management   Initial Visit Note  08/26/2019 Name: Maurice Weaver MRN: [id number] DOB: [birth year]  Referred by: No primary care provider on file. Reason for referral : Chronic Care Management (initial visit)   [name] is a 74 y.o. year old male who is a primary care patient of No primary care provider on file.. The CCM team was consulted for assistance with chronic disease management and care coordination needs related to Afib, CAD, HTN, Asthma, HLD.  Review of patient status, including review of consultants reports, relevant laboratory and other test results, and collaboration with appropriate care team members and the patient's provider was performed as part of comprehensive patient evaluation and provision of chronic care management services.     I spoke with [name] by telephone today for an Initial CCM Visit. He was enrolled, but is no longer interested in participating with the CCM program.   Plan: CCM enrollment status changed to "previously enrolled" as per patient request on .td to discontinue enrollment. Case closed to case management services in primary care home.   Chong Sicilian, BSN, RN-BC Embedded Chronic Care Manager [facility] Direct Dial: [phone]

## 2019-09-30 ENCOUNTER — Other Ambulatory Visit: Payer: Self-pay | Admitting: *Deleted

## 2019-09-30 NOTE — Telephone Encounter (Signed)
Former Advertising account planner. NTBS LOV 09/21/18

## 2019-10-17 DIAGNOSIS — M79675 Pain in left toe(s): Secondary | ICD-10-CM | POA: Diagnosis not present

## 2019-12-05 ENCOUNTER — Other Ambulatory Visit: Payer: Self-pay | Admitting: *Deleted

## 2020-02-06 DIAGNOSIS — Z0100 Encounter for examination of eyes and vision without abnormal findings: Secondary | ICD-10-CM | POA: Diagnosis not present

## 2020-02-06 DIAGNOSIS — H2513 Age-related nuclear cataract, bilateral: Secondary | ICD-10-CM | POA: Diagnosis not present

## 2020-02-06 DIAGNOSIS — H5200 Hypermetropia, unspecified eye: Secondary | ICD-10-CM | POA: Diagnosis not present

## 2020-02-06 DIAGNOSIS — H43811 Vitreous degeneration, right eye: Secondary | ICD-10-CM | POA: Diagnosis not present

## 2020-04-30 DIAGNOSIS — J45909 Unspecified asthma, uncomplicated: Secondary | ICD-10-CM | POA: Diagnosis not present

## 2020-04-30 DIAGNOSIS — F0390 Unspecified dementia without behavioral disturbance: Secondary | ICD-10-CM | POA: Diagnosis not present

## 2020-04-30 DIAGNOSIS — E785 Hyperlipidemia, unspecified: Secondary | ICD-10-CM | POA: Diagnosis not present

## 2020-04-30 DIAGNOSIS — I10 Essential (primary) hypertension: Secondary | ICD-10-CM | POA: Diagnosis not present

## 2020-04-30 DIAGNOSIS — K219 Gastro-esophageal reflux disease without esophagitis: Secondary | ICD-10-CM | POA: Diagnosis not present

## 2020-06-03 ENCOUNTER — Ambulatory Visit: Payer: Medicare HMO | Admitting: Physician Assistant

## 2020-06-03 ENCOUNTER — Encounter: Payer: Self-pay | Admitting: Physician Assistant

## 2020-06-03 ENCOUNTER — Other Ambulatory Visit: Payer: Medicare HMO

## 2020-06-03 VITALS — BP 118/70 | HR 65 | Ht 71.0 in | Wt 181.0 lb

## 2020-06-03 DIAGNOSIS — R197 Diarrhea, unspecified: Secondary | ICD-10-CM | POA: Diagnosis not present

## 2020-06-03 NOTE — Progress Notes (Signed)
Chief Complaint: Diarrhea  HPI:    Mr. Bosher is a 75 year old Caucasian male with a past medical history of A. fib, CAD, GERD and multiple others listed below, known to [name], who presents to clinic today for complaint of diarrhea.      05/27/2015 patient seen in clinic by [name] for diarrhea which patient described mostly postprandial and occurring even with the meals, typically loose watery stools.  CT abdomen pelvis was ordered first.    05/29/2015 CT with bilateral renal cysts, bilateral nonobstructive nephrolithiasis, increased size of moderate to large hiatal hernia, colonic diverticulosis, cholelithiasis and stable mildly enlarged prostate.  Celiac testing was ordered.    06/23/2015 celiac testing negative    07/02/2015 colonoscopy done for significant diarrhea of unexplained origin which showed normal examined portion of ileum, entirely normal colon and internal hemorrhoids.  Biopsies normal.  Was recommend the patient use a daily Imodium or a half Imodium or 2 Imodium a day to check the diarrhea.  It was not recommended he have further testing.    Today, the patient presents to clinic accompanied by his grandson who does assist with history.  He explains that he has really continued chronically off and on with diarrhea even since being seen here last, but over the past month or so it seems to be pretty consistent describing at least 3-5 stools per day.  The first one "has a little bit of form but is not normal" and then after that every stool is watery.  Tells me he can sometimes go 2 hours before he has to have another 1 and sometimes will go 5-6 hours between.  No changes in diet, sick contacts or changes in medication recently.  Patient tells me he has no accompanying abdominal pain or blood in his stool and has not been losing weight.  Overall this "does not really bother me much".  Does describe that he wakes up in the night and when he does go the bathroom then he will sometimes  have a loose stool but he does not think that this is what is waking him up.    Denies fever, chills, weight loss, blood in stool or symptoms that awaken him from sleep.  Past Medical History:  Diagnosis Date  . AF (atrial fibrillation) ([place])   . Asthma    as a child. stable  . CAD (coronary artery disease)   . Cholesteatoma   . Decreased hearing   . Diverticulitis of colon    recurrent w/abscess - resected 2011  . Diverticulosis   . ED (erectile dysfunction)   . Fundic gland polyps of stomach, benign   . GERD (gastroesophageal reflux disease)   . Hiatal hernia   . Hyperlipidemia   . Hypertension   . Melanoma ([place]) 2013   Right Cheek  . MRSA (methicillin resistant staph aureus) culture positive 07/2009  . Nephrolithiasis   . S/P angioplasty   . Schatzki's ring   . Vitamin D deficiency     Past Surgical History:  Procedure Laterality Date  . COLON SURGERY  2011   diverticulitis left resection-Toth  . COLONOSCOPY  Multiple  . CORONARY ANGIOPLASTY WITH STENT PLACEMENT  2000  . ESOPHAGOGASTRODUODENOSCOPY  Multiple  . INNER EAR SURGERY Bilateral 2015   x6  cholesteatomas  . MELANOMA EXCISION  2012  . TONSILLECTOMY AND ADENOIDECTOMY      Current Outpatient Medications  Medication Sig Dispense Refill  . albuterol (PROVENTIL HFA;VENTOLIN HFA) 108 (90 Base) MCG/ACT inhaler  INHALE 2 PUFFS INTO THE LUNGS EVERY 6 (SIX) HOURS AS NEEDED FOR WHEEZING OR SHORTNESS OF BREATH. 54 g 1  . donepezil (ARICEPT) 5 MG tablet TAKE 1 TABLET AT BEDTIME 90 tablet 0  . ezetimibe (ZETIA) 10 MG tablet TAKE 1 TABLET AT BEDTIME 90 tablet 0  . fluticasone (FLONASE) 50 MCG/ACT nasal spray Place 2 sprays into both nostrils daily. 48 g 3  . hydrochlorothiazide (HYDRODIURIL) 25 MG tablet TAKE 1 TABLET EVERY DAY 90 tablet 1  . Loperamide HCl (IMODIUM A-D PO) Take 1 tablet by mouth daily as needed.    . memantine (NAMENDA) 5 MG tablet Take 1 tablet (5 mg total) by mouth 2 (two) times daily. 180 tablet 2  .  metoprolol succinate (TOPROL-XL) 100 MG 24 hr tablet TAKE 1 TABLET EVERY DAY 90 tablet 1  . nystatin-triamcinolone ointment (MYCOLOG) APPLY TO AFFECTED AREA(S) TWICE DAILY 30 g 2  . omeprazole (PRILOSEC) 40 MG capsule TAKE 1 CAPSULE EVERY DAY 90 capsule 1  . potassium chloride (KLOR-CON) 10 MEQ tablet TAKE 1 TABLET EVERY DAY 90 tablet 0  . ramipril (ALTACE) 10 MG capsule TAKE 1 CAPSULE EVERY DAY 90 capsule 1  . rosuvastatin (CRESTOR) 20 MG tablet TAKE 1 TABLET AT BEDTIME 90 tablet 1   No current facility-administered medications for this visit.    Allergies as of 06/03/2020 - Review Complete 05/02/2019  Allergen Reaction Noted  . Ampicillin Nausea Only 11/07/2011  . Erythromycin Other (See Comments) 06/13/2012    Family History  Problem Relation Age of Onset  . Pancreatic cancer Mother   . Hypertension Mother   . Heart disease Father        Died of MI age 36  . Heart attack Father   . Urolithiasis Brother   . Arthritis Brother   . Dementia Brother   . Alzheimer's disease Sister   . Colon cancer Neg Hx   . Esophageal cancer Neg Hx   . Rectal cancer Neg Hx   . Stomach cancer Neg Hx   . Prostate cancer Neg Hx     Social History   Socioeconomic History  . Marital status: Divorced    Spouse name: Not on file  . Number of children: 2  . Years of education: Not on file  . Highest education level: Some college, no degree  Occupational History  . Occupation: Arts development officer: [employer]  Tobacco Use  . Smoking status: Former Smoker    Packs/day: 0.50    Years: 30.00    Pack years: 15.00    Types: Cigarettes    Start date: 02/29/1960    Quit date: 04/29/1998    Years since quitting: 22.1  . Smokeless tobacco: Never Used  . Tobacco comment: Patient quit "cold [place]" in 2000  Vaping Use  . Vaping Use: Never used  Substance and Sexual Activity  . Alcohol use: No    Alcohol/week: 0.0 standard drinks  . Drug use: No  . Sexual activity: Not Currently  Other  Topics Concern  . Not on file  Social History Narrative   Divorced and lives alone   Does have a step daughter in the area   Retired from [employer]   1 caffeine drinks daily   05/27/2015            Social Determinants of Health   Financial Resource Strain: Not on file  Food Insecurity: Not on file  Transportation Needs: Not on file  Physical Activity: Not on  file  Stress: Not on file  Social Connections: Not on file  Intimate Partner Violence: Not on file    Review of Systems:    Constitutional: No weight loss, fever or chills Skin: No rash  Cardiovascular: No chest pain  Respiratory: No SOB  Gastrointestinal: See HPI and otherwise negative Genitourinary: No dysuria  Neurological: No headache, dizziness or syncope Musculoskeletal: No new muscle or joint pain Hematologic: No bleeding  Psychiatric: No history of depression or anxiety   Physical Exam:  Vital signs: BP 118/70   Pulse 65   Ht 5\' 11"  (1.803 m)   Wt 181 lb (82.1 kg)   BMI 25.24 kg/m   Constitutional:   Pleasant elderly Caucasian male appears to be in NAD, Well developed, Well nourished, alert and cooperative Head:  Normocephalic and atraumatic. Eyes:   PEERL, EOMI. No icterus. Conjunctiva pink. Ears:  Normal auditory acuity. Neck:  Supple Throat: Oral cavity and pharynx without inflammation, swelling or lesion.  Respiratory: Respirations even and unlabored. Lungs clear to auscultation bilaterally.   No wheezes, crackles, or rhonchi.  Cardiovascular: Normal S1, S2. No MRG. Regular rate and rhythm. No peripheral edema, cyanosis or pallor.  Gastrointestinal:  Soft, nondistended, nontender. No rebound or guarding.  Increased bowel sounds all 4 quadrants. No appreciable masses or hepatomegaly. Rectal:  Not performed.  Msk:  Symmetrical without gross deformities. Without edema, no deformity or joint abnormality.  Neurologic:  Alert and  oriented x4;  grossly normal neurologically.  Skin:   Dry and  intact without significant lesions or rashes. Psychiatric: Demonstrates good judgement and reason without abnormal affect or behaviors.  No recent labs or imaging.  Assessment: 1.  Acute on chronic diarrhea: Worse over the past month, seems chronic though for the patient continuing even since 2017 when he had a full work-up with celiac test, colonoscopy and a CT of the abdomen and pelvis all with no etiology; consider new infectious cause versus IBS versus other  Plan: 1.  Ordered stool studies for the patient including a GI pathogen panel, O&P and fecal pancreatic elastase. 2.  Pending results from above we will give further recommendations.  There are no alarm symptoms though, no blood in patient's stool or weight loss. 3.  Patient to follow in clinic per recommendations after stool studies above.  Ellouise Newer, PA-C [facility] 06/03/2020, 2:21 PM

## 2020-06-03 NOTE — Patient Instructions (Signed)
If you are age 76 or older, your body mass index should be between 23-30. Your Body mass index is 25.24 kg/m. If this is out of the aforementioned range listed, please consider follow up with your Primary Care Provider.  If you are age 8 or younger, your body mass index should be between 19-25. Your Body mass index is 25.24 kg/m. If this is out of the aformentioned range listed, please consider follow up with your Primary Care Provider.   Your provider has requested that you go to the basement level for lab work before leaving today. Press "B" on the elevator. The lab is located at the first door on the left as you exit the elevator.  You may use Imodium as needed for diarrhea.  Thank you for choosing me and [facility].  Ellouise Newer, PA-C

## 2020-06-05 DIAGNOSIS — R197 Diarrhea, unspecified: Secondary | ICD-10-CM | POA: Diagnosis not present

## 2020-06-09 LAB — GI PROFILE, STOOL, PCR

## 2020-06-12 LAB — OVA AND PARASITE EXAMINATION
CONCENTRATE RESULT:: NONE SEEN
MICRO NUMBER:: 11748343
SPECIMEN QUALITY:: ADEQUATE
TRICHROME RESULT:: NONE SEEN

## 2020-06-12 LAB — PANCREATIC ELASTASE, FECAL: Pancreatic Elastase-1, Stool: >500 mcg/g

## 2020-07-07 ENCOUNTER — Ambulatory Visit: Payer: Medicare HMO | Admitting: Physician Assistant

## 2020-07-29 ENCOUNTER — Encounter: Payer: Self-pay | Admitting: Physician Assistant

## 2020-07-29 ENCOUNTER — Ambulatory Visit: Payer: Medicare HMO | Admitting: Physician Assistant

## 2020-07-29 VITALS — BP 120/80 | HR 77 | Ht 71.0 in | Wt 181.2 lb

## 2020-07-29 DIAGNOSIS — K625 Hemorrhage of anus and rectum: Secondary | ICD-10-CM

## 2020-07-29 DIAGNOSIS — R197 Diarrhea, unspecified: Secondary | ICD-10-CM | POA: Diagnosis not present

## 2020-07-29 DIAGNOSIS — K648 Other hemorrhoids: Secondary | ICD-10-CM | POA: Diagnosis not present

## 2020-07-29 NOTE — Patient Instructions (Signed)
If you are age 76 or older, your body mass index should be between 23-30. Your Body mass index is 25.27 kg/m. If this is out of the aforementioned range listed, please consider follow up with your Primary Care Provider.  If you are age 3 or younger, your body mass index should be between 19-25. Your Body mass index is 25.27 kg/m. If this is out of the aformentioned range listed, please consider follow up with your Primary Care Provider.   Use over the counter Preparation H suppositories with hydrocortisone cream ointment for 1-2 weeks   If bleeding get worse let us know.  The [facility] providers would like to encourage you to use [facility] to communicate with providers for non-urgent requests or questions.  Due to long hold times on the telephone, sending your provider a message by [facility] may be a faster and more efficient way to get a response.  Please allow 48 business hours for a response.  Please remember that this is for non-urgent requests.  It was a pleasure to see you today!  Thank you for trusting me with your gastrointestinal care!    Ellouise Newer, PA-C

## 2020-07-29 NOTE — Progress Notes (Signed)
Chief Complaint: Follow-up diarrhea  HPI:    Maurice Weaver is a 75 year old Caucasian male with a past medical history of A. fib, CAD, GERD and multiple others listed below, known to [name], who returns to clinic today for follow-up of his diarrhea.    05/27/2015 patient seen in clinic by [name] for diarrhea which patient described mostly postprandial and occurring even with the meals, typically loose watery stools.  CT abdomen pelvis was ordered first.    05/29/2015 CT with bilateral renal cysts, bilateral nonobstructive nephrolithiasis, increased size of moderate to large hiatal hernia, colonic diverticulosis, cholelithiasis and stable mildly enlarged prostate.  Celiac testing was ordered.    06/23/2015 celiac testing negative    07/02/2015 colonoscopy done for significant diarrhea of unexplained origin which showed normal examined portion of ileum, entirely normal colon and internal hemorrhoids.  Biopsies normal.  Was recommend the patient use a daily Imodium or a half Imodium or 2 Imodium a day to check the diarrhea.  It was not recommended he have further testing.    06/03/2020 patient seen in clinic accompanied by his grandson and explained that he had chronically continues with off-and-on diarrhea even since being here in 2017.  Over the past month it has been consistent with a least 3-5 stools per day.  At that time ordered stool studies including a path panel, O&P and fecal pancreatic elastase.    06/05/2020 all stool studies returned normal.  Recommend that he add in a fiber supplement once daily for now with a possible increase to 2.    Today, the patient returns to clinic accompanied by his stepson and they explain that patient is no longer having any abdominal discomfort, he continues with 3-5 stools a day which start out solid and get a little bit looser.  This has not really changed with the fiber supplement but again does not bother him.  He does describe today he has been seeing a  little bit of bright red blood after wiping from a bowel movement on the toilet paper.  He thinks this is from hemorrhoids because he can feel some little "marbles" back there.  Again does not really seem bothered by any of the symptoms.    Denies fever, chills or weight loss.  Past Medical History:  Diagnosis Date  . AF (atrial fibrillation) ([place])   . Asthma    as a child. stable  . CAD (coronary artery disease)   . Cholesteatoma   . Decreased hearing   . Diverticulitis of colon    recurrent w/abscess - resected 2011  . Diverticulosis   . ED (erectile dysfunction)   . Fundic gland polyps of stomach, benign   . GERD (gastroesophageal reflux disease)   . Hiatal hernia   . Hyperlipidemia   . Hypertension   . Melanoma ([place]) 2013   Right Cheek  . MRSA (methicillin resistant staph aureus) culture positive 07/2009  . Nephrolithiasis   . S/P angioplasty   . Schatzki's ring   . Vitamin D deficiency     Past Surgical History:  Procedure Laterality Date  . COLON SURGERY  2011   diverticulitis left resection-Toth  . COLONOSCOPY  Multiple  . CORONARY ANGIOPLASTY WITH STENT PLACEMENT  2000  . ESOPHAGOGASTRODUODENOSCOPY  Multiple  . INNER EAR SURGERY Bilateral 2015   x6  cholesteatomas  . MELANOMA EXCISION  2012  . TONSILLECTOMY AND ADENOIDECTOMY      Current Outpatient Medications  Medication Sig Dispense Refill  . albuterol (  PROVENTIL HFA;VENTOLIN HFA) 108 (90 Base) MCG/ACT inhaler INHALE 2 PUFFS INTO THE LUNGS EVERY 6 (SIX) HOURS AS NEEDED FOR WHEEZING OR SHORTNESS OF BREATH. 54 g 1  . donepezil (ARICEPT) 5 MG tablet TAKE 1 TABLET AT BEDTIME 90 tablet 0  . ezetimibe (ZETIA) 10 MG tablet TAKE 1 TABLET AT BEDTIME 90 tablet 0  . fluticasone (FLONASE) 50 MCG/ACT nasal spray Place 2 sprays into both nostrils daily. 48 g 3  . hydrochlorothiazide (HYDRODIURIL) 25 MG tablet TAKE 1 TABLET EVERY DAY 90 tablet 1  . Loperamide HCl (IMODIUM A-D PO) Take 1 tablet by mouth daily as needed.     . memantine (NAMENDA) 5 MG tablet Take 1 tablet (5 mg total) by mouth 2 (two) times daily. 180 tablet 2  . metoprolol succinate (TOPROL-XL) 100 MG 24 hr tablet TAKE 1 TABLET EVERY DAY 90 tablet 1  . nystatin-triamcinolone ointment (MYCOLOG) APPLY TO AFFECTED AREA(S) TWICE DAILY 30 g 2  . omeprazole (PRILOSEC) 40 MG capsule TAKE 1 CAPSULE EVERY DAY 90 capsule 1  . potassium chloride (KLOR-CON) 10 MEQ tablet TAKE 1 TABLET EVERY DAY 90 tablet 0  . ramipril (ALTACE) 10 MG capsule TAKE 1 CAPSULE EVERY DAY 90 capsule 1  . rosuvastatin (CRESTOR) 20 MG tablet TAKE 1 TABLET AT BEDTIME 90 tablet 1   No current facility-administered medications for this visit.    Allergies as of 07/29/2020 - Review Complete 06/03/2020  Allergen Reaction Noted  . Ampicillin Nausea Only 11/07/2011  . Erythromycin Other (See Comments) 06/13/2012    Family History  Problem Relation Age of Onset  . Pancreatic cancer Mother   . Hypertension Mother   . Heart disease Father        Died of MI age 50  . Heart attack Father   . Urolithiasis Brother   . Arthritis Brother   . Dementia Brother   . Alzheimer's disease Sister   . Colon cancer Neg Hx   . Esophageal cancer Neg Hx   . Rectal cancer Neg Hx   . Stomach cancer Neg Hx   . Prostate cancer Neg Hx     Social History   Socioeconomic History  . Marital status: Divorced    Spouse name: Not on file  . Number of children: 2  . Years of education: Not on file  . Highest education level: Some college, no degree  Occupational History  . Occupation: Electronics-retired    Employer: [employer]  Tobacco Use  . Smoking status: Former Smoker    Packs/day: 0.50    Years: 30.00    Pack years: 15.00    Types: Cigarettes    Start date: 02/29/1960    Quit date: 04/29/1998    Years since quitting: 22.2  . Smokeless tobacco: Never Used  . Tobacco comment: Patient quit "cold [place]" in 2000  Vaping Use  . Vaping Use: Never used  Substance and Sexual Activity   . Alcohol use: No    Alcohol/week: 0.0 standard drinks  . Drug use: No  . Sexual activity: Not Currently  Other Topics Concern  . Not on file  Social History Narrative   Divorced and lives alone   Does have a step daughter in the area   Retired from [employer]   1 caffeine drinks daily   05/27/2015            Social Determinants of Health   Financial Resource Strain: Not on file  Food Insecurity: Not on file  Transportation Needs:  Not on file  Physical Activity: Not on file  Stress: Not on file  Social Connections: Not on file  Intimate Partner Violence: Not on file    Review of Systems:    Constitutional: No weight loss, fever or chills Cardiovascular: No chest pain  Respiratory: No SOB Gastrointestinal: See HPI and otherwise negative   Physical Exam:  Vital signs: BP 120/80   Pulse 77   Ht 5\' 11"  (1.803 m)   Wt 181 lb 3.2 oz (82.2 kg)   SpO2 98%   BMI 25.27 kg/m   Constitutional:   Pleasant Caucasian male appears to be in NAD, Well developed, Well nourished, alert and cooperative Respiratory: Respirations even and unlabored. Lungs clear to auscultation bilaterally.   No wheezes, crackles, or rhonchi.  Cardiovascular: Normal S1, S2. No MRG. Regular rate and rhythm. No peripheral edema, cyanosis or pallor.  Gastrointestinal:  Soft, nondistended, nontender. No rebound or guarding. Normal bowel sounds. No appreciable masses or hepatomegaly. Rectal: External: Slight dermatitis around the rectum; internal: Bulging hemorrhoids, decreased sphincter tone Psychiatric:  Demonstrates good judgement and reason without abnormal affect or behaviors.  See HPI for recent labs.  Assessment: 1.  Diarrhea: Continues for the patient, but this is chronic, does not bother him, recent stool studies normal/negative, previous work-up for this in 2017 was negative including colonoscopy; most likely dietary versus IBS versus other 2.  Rectal bleeding: Likely from internal  hemorrhoids  Plan: 1.  Prescribed Hydrocortisone ointment to be placed on Preparation H suppositories twice daily for 1 to 2 weeks for hemorrhoids.  Discussed that if the patient has any increase in bleeding or this does not take care of the problem then he needs to call and let us know and we will need to be arranged for a colonoscopy with [name]. 2.  Patient to continue fiber supplement daily 3.  Patient to follow in clinic with [place] as needed in the future.  Ellouise Newer, PA-C [facility] 07/29/2020, 9:09 AM  Cc: Adaline Sill, NP

## 2020-08-11 ENCOUNTER — Telehealth: Payer: Self-pay | Admitting: Physician Assistant

## 2020-08-11 NOTE — Telephone Encounter (Signed)
Lab [employer] calling requesting ICD 10 code for 009.3. Ref# [id number]/// Tele# [phone].Marland Kitchen Plz advise ..thanks

## 2020-08-11 NOTE — Telephone Encounter (Signed)
Spoke with Maurice Weaver at [employer] [facility], the incorrect ICD 10 code was provided for the stool studies from 06/05/20. I have provided her with the correct ICD 10 code of R19.7.

## 2021-04-07 ENCOUNTER — Ambulatory Visit: Payer: Medicare HMO | Admitting: Cardiology

## 2022-10-01 ENCOUNTER — Encounter (HOSPITAL_BASED_OUTPATIENT_CLINIC_OR_DEPARTMENT_OTHER): Payer: Self-pay

## 2022-10-01 ENCOUNTER — Emergency Department (HOSPITAL_BASED_OUTPATIENT_CLINIC_OR_DEPARTMENT_OTHER): Payer: Medicare HMO | Admitting: Radiology

## 2022-10-01 ENCOUNTER — Other Ambulatory Visit: Payer: Self-pay

## 2022-10-01 ENCOUNTER — Emergency Department (HOSPITAL_BASED_OUTPATIENT_CLINIC_OR_DEPARTMENT_OTHER)
Admission: EM | Admit: 2022-10-01 | Discharge: 2022-10-01 | Disposition: A | Discharge status: Home / Self Care | Payer: Medicare HMO | Attending: Emergency Medicine | Admitting: Emergency Medicine

## 2022-10-01 DIAGNOSIS — E876 Hypokalemia: Secondary | ICD-10-CM | POA: Insufficient documentation

## 2022-10-01 DIAGNOSIS — R079 Chest pain, unspecified: Secondary | ICD-10-CM

## 2022-10-01 DIAGNOSIS — Z87891 Personal history of nicotine dependence: Secondary | ICD-10-CM | POA: Diagnosis not present

## 2022-10-01 DIAGNOSIS — R42 Dizziness and giddiness: Secondary | ICD-10-CM | POA: Diagnosis not present

## 2022-10-01 DIAGNOSIS — R002 Palpitations: Secondary | ICD-10-CM | POA: Diagnosis not present

## 2022-10-01 DIAGNOSIS — I447 Left bundle-branch block, unspecified: Secondary | ICD-10-CM | POA: Insufficient documentation

## 2022-10-01 DIAGNOSIS — K449 Diaphragmatic hernia without obstruction or gangrene: Secondary | ICD-10-CM

## 2022-10-01 DIAGNOSIS — I700 Atherosclerosis of aorta: Secondary | ICD-10-CM | POA: Insufficient documentation

## 2022-10-01 DIAGNOSIS — Z20822 Contact with and (suspected) exposure to covid-19: Secondary | ICD-10-CM | POA: Insufficient documentation

## 2022-10-01 DIAGNOSIS — Z79899 Other long term (current) drug therapy: Secondary | ICD-10-CM | POA: Insufficient documentation

## 2022-10-01 DIAGNOSIS — R0789 Other chest pain: Secondary | ICD-10-CM | POA: Diagnosis not present

## 2022-10-01 DIAGNOSIS — Z1152 Encounter for screening for COVID-19: Secondary | ICD-10-CM | POA: Insufficient documentation

## 2022-10-01 DIAGNOSIS — F0390 Unspecified dementia without behavioral disturbance: Secondary | ICD-10-CM | POA: Diagnosis not present

## 2022-10-01 DIAGNOSIS — I10 Essential (primary) hypertension: Secondary | ICD-10-CM | POA: Diagnosis not present

## 2022-10-01 LAB — TROPONIN I (HIGH SENSITIVITY)
Troponin I (High Sensitivity): 4 ng/L (ref <18)
Troponin I (High Sensitivity): 4 ng/L (ref <18)

## 2022-10-01 LAB — BASIC METABOLIC PANEL
Anion gap: 10 (ref 5–15)
BUN: 16 mg/dL (ref 8–23)
CO2: 29 mmol/L (ref 22–32)
Calcium: 10.5 mg/dL — ABNORMAL HIGH (ref 8.9–10.3)
Chloride: 99 mmol/L (ref 98–111)
Creatinine, Ser: 1.21 mg/dL (ref 0.61–1.24)
GFR, Estimated: >60 mL/min (ref >60)
Glucose, Bld: 132 mg/dL — ABNORMAL HIGH (ref 70–99)
Potassium: 2.9 mmol/L — ABNORMAL LOW (ref 3.5–5.1)
Sodium: 138 mmol/L (ref 135–145)

## 2022-10-01 LAB — CBC
HCT: 44.1 % (ref 39.0–52.0)
Hemoglobin: 15.3 g/dL (ref 13.0–17.0)
MCH: 31.6 pg (ref 26.0–34.0)
MCHC: 34.7 g/dL (ref 30.0–36.0)
MCV: 91.1 fL (ref 80.0–100.0)
Platelets: 234 10*3/uL (ref 150–400)
RBC: 4.84 MIL/uL (ref 4.22–5.81)
RDW: 12.5 % (ref 11.5–15.5)
WBC: 8.1 10*3/uL (ref 4.0–10.5)
nRBC: 0 % (ref 0.0–0.2)

## 2022-10-01 LAB — RESP PANEL BY RT-PCR (RSV, FLU A&B, COVID)  RVPGX2
Influenza A by PCR: NEGATIVE
Influenza B by PCR: NEGATIVE
Resp Syncytial Virus by PCR: NEGATIVE
SARS Coronavirus 2 by RT PCR: NEGATIVE

## 2022-10-01 MED ORDER — POTASSIUM CHLORIDE CRYS ER 20 MEQ PO TBCR
40.0000 meq | EXTENDED_RELEASE_TABLET | Freq: Once | ORAL | Status: AC
  Administered 2022-10-01: 40 meq via ORAL
  Filled 2022-10-01: qty 2

## 2022-10-01 MED ORDER — SODIUM CHLORIDE 0.9 % IV SOLN: INTRAVENOUS | Status: DC | PRN

## 2022-10-01 MED ORDER — POTASSIUM CHLORIDE 10 MEQ/100ML IV SOLN
10.0000 meq | Freq: Once | INTRAVENOUS | Status: AC
  Administered 2022-10-01: 10 meq via INTRAVENOUS
  Filled 2022-10-01: qty 100

## 2022-10-01 MED ORDER — MAGNESIUM OXIDE 400 MG PO CAPS: 400.0000 mg | ORAL_CAPSULE | Freq: Every day | ORAL | 0 refills | Status: AC

## 2022-10-01 MED ORDER — POTASSIUM CHLORIDE ER 10 MEQ PO TBCR
10.0000 meq | EXTENDED_RELEASE_TABLET | Freq: Every day | ORAL | 0 refills | Status: AC
Start: 2022-10-01 — End: 2022-10-31

## 2022-10-01 NOTE — ED Triage Notes (Signed)
Patient arrives with accompanied by his stepdaughter who states that he has been having increased chest discomfort, dizziness, and palpitations.   Per family member, patient has a hx of dementia and has difficulty verbalizing his complaints at times.  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

## 2022-10-01 NOTE — ED Provider Notes (Signed)
[place] EMERGENCY DEPARTMENT AT [facility] Provider Note   CSN: [id number] Arrival date & time: 10/01/22  6010     History  Chief Complaint  Patient presents with   Chest Pain   Dizziness    Maurice Weaver is a 77 y.o. male presented to ED with complaint of chest discomfort, dizziness and palpitations.  History is provided by his stepdaughter at bedside who is his power of attorney, as the patient is a level 5 caveat for dementia.  Her stepdaughter reports that the patient was complaining of chest discomfort earlier today which is unusual for him.  He currently has no such complaint.  She reports that one of the neighbors that check on the patient and was concerned that he was having "palpitations" but she noted his blood pressure was otherwise normal when she checked it, and his heart rate too.  She says the patient complains chronically of lightheadedness, which can occur at random, and has been going on for years.  He was seen by cardiologist many years ago, but does not actively follow with a cardiologist.  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  He has high blood pressure, borderline diabetes, and high cholesterol.  He quit smoking many years ago.  HPI     Home Medications Prior to Admission medications   Medication Sig Start Date End Date Taking? Authorizing Provider  Magnesium Oxide 400 MG CAPS Take 1 capsule (400 mg total) by mouth daily for 14 days. 10/01/22 10/15/22 Yes , Kermit Balo, MD  metFORMIN (GLUCOPHAGE) 500 MG tablet Take 500 mg by mouth daily with breakfast.   Yes [provider]  potassium chloride (KLOR-CON) 10 MEQ tablet Take 1 tablet (10 mEq total) by mouth daily for 30 doses. 10/01/22 10/31/22 Yes , Kermit Balo, MD  albuterol (PROVENTIL HFA;VENTOLIN HFA) 108 (90 Base) MCG/ACT inhaler INHALE 2 PUFFS INTO THE LUNGS EVERY 6 (SIX) HOURS AS NEEDED FOR WHEEZING OR SHORTNESS OF BREATH. 07/18/17   Ernestina Penna, MD  donepezil  (ARICEPT) 5 MG tablet TAKE 1 TABLET AT BEDTIME 04/24/19   Sonny Masters, FNP  ezetimibe (ZETIA) 10 MG tablet TAKE 1 TABLET AT BEDTIME 04/24/19   Sonny Masters, FNP  fluticasone (FLONASE) 50 MCG/ACT nasal spray Place 2 sprays into both nostrils daily. 11/09/16   Ernestina Penna, MD  hydrochlorothiazide (HYDRODIURIL) 25 MG tablet TAKE 1 TABLET EVERY DAY 09/24/18   Sonny Masters, FNP  Loperamide HCl (IMODIUM A-D PO) Take 1 tablet by mouth daily as needed.    [provider]  memantine (NAMENDA) 5 MG tablet Take 1 tablet (5 mg total) by mouth 2 (two) times daily. 01/15/19   Sonny Masters, FNP  metoprolol succinate (TOPROL-XL) 100 MG 24 hr tablet TAKE 1 TABLET EVERY DAY 09/24/18   Rakes, Doralee Albino, FNP  nystatin-triamcinolone ointment (MYCOLOG) APPLY TO AFFECTED AREA(S) TWICE DAILY 06/07/19   Sonny Masters, FNP  omeprazole (PRILOSEC) 40 MG capsule TAKE 1 CAPSULE EVERY DAY 03/04/19   Sonny Masters, FNP  potassium chloride (KLOR-CON) 10 MEQ tablet TAKE 1 TABLET EVERY DAY 04/24/19   Sonny Masters, FNP  ramipril (ALTACE) 10 MG capsule TAKE 1 CAPSULE EVERY DAY 09/27/18   Sonny Masters, FNP  rosuvastatin (CRESTOR) 20 MG tablet TAKE 1 TABLET AT BEDTIME 09/24/18   Sonny Masters, FNP      Allergies    Ampicillin and Erythromycin    Review of Systems  Review of Systems  Physical Exam Updated Vital Signs BP (!) 142/72 (BP Location: Left Arm)   Pulse 83   Temp 97.6 F (36.4 C) (Temporal)   Resp 20   Ht 5\' 11"  (1.803 m)   Wt 82.2 kg   SpO2 100%   BMI 25.27 kg/m  Physical Exam Constitutional:      General: He is not in acute distress. HENT:     Head: Normocephalic and atraumatic.  Eyes:     Conjunctiva/sclera: Conjunctivae normal.     Pupils: Pupils are equal, round, and reactive to light.  Cardiovascular:     Rate and Rhythm: Normal rate and regular rhythm.  Pulmonary:     Effort: Pulmonary effort is normal. No respiratory distress.  Abdominal:     General: There is no distension.      Tenderness: There is no abdominal tenderness.  Skin:    General: Skin is warm and dry.  Neurological:     General: No focal deficit present.     Mental Status: He is alert. Mental status is at baseline.  Psychiatric:        Mood and Affect: Mood normal.        Behavior: Behavior normal.     ED Results / Procedures / Treatments   Labs (all labs ordered are listed, but only abnormal results are displayed) Labs Reviewed  BASIC METABOLIC PANEL - Abnormal; Notable for the following components:      Result Value   Potassium 2.9 (*)    Glucose, Bld 132 (*)    Calcium 10.5 (*)    All other components within normal limits  RESP PANEL BY RT-PCR (RSV, FLU A&B, COVID)  [id number]  CBC  TROPONIN I (HIGH SENSITIVITY)  TROPONIN I (HIGH SENSITIVITY)    EKG EKG Interpretation Date/Time:  Saturday October 01 2022 10:10:24 EDT Ventricular Rate:  77 PR Interval:  210 QRS Duration:  160 QT Interval:  432 QTC Calculation: 488 R Axis:   -5  Text Interpretation: Sinus rhythm with 1st degree A-V block Left bundle branch block Abnormal ECG When compared with ECG of 29-May-2009 21:01, PR interval has increased Left bundle branch block is now Present Confirmed by Alvester Chou [id number]) on 10/01/2022 10:18:04 AM  Radiology DG Chest 2 View  Result Date: 10/01/2022 CLINICAL DATA:  Chest pain EXAM: CHEST - 2 VIEW COMPARISON:  11/09/2016 FINDINGS: Heart size is normal. Aortic atherosclerosis as seen previously. Enlargement of a hiatal hernia, twice is large as was seen in 2018. There is pulmonary venous hypertension but no frank edema. No effusion. No acute bone finding. IMPRESSION: Large hiatal hernia, considerably larger than was seen in 2018. Pulmonary venous hypertension but no frank edema. Aortic atherosclerosis. No acute finding otherwise. Electronically Signed   By: Paulina Fusi M.D.   On: 10/01/2022 10:53    Procedures Procedures    Medications Ordered in ED Medications  0.9 %  sodium chloride  infusion (has no administration in time range)  potassium chloride 10 mEq in 100 mL IVPB (10 mEq Intravenous New Bag/Given 10/01/22 1216)  potassium chloride SA (KLOR-CON M) CR tablet 40 mEq (40 mEq Oral Given 10/01/22 1203)    ED Course/ Medical Decision Making/ A&P Clinical Course as of 10/01/22 1318  Sat Oct 01, 2022  1317 Patient reassessed and remains pain-free.  He is completing his potassium infusion.  His stepdaughter reports he is already on 1 tablet of potassium daily.  We will add a second tablet for 30 days  as well as a short prescription of magnesium to help replenish electrolytes.  I strongly encouraged him to follow-up with a cardiologist as he has not been seen in 3 years, and has a new left bundle branch block of unclear chronicity.  She verbalized understanding.  But at this time with a negative troponins, no active chest pain, I do not see an indication for hospitalization and I believe he is reasonably stable for discharge [MT]    Clinical Course User Index [MT] , Kermit Balo, MD                                 Medical Decision Making Amount and/or Complexity of Data Reviewed Labs: ordered. Radiology: ordered.  Risk OTC drugs. Prescription drug management.   This patient presents to the Emergency Department with complaint of chest pain. This involves an extensive number of treatment options, and is a complaint that carries with it a high risk of complications and morbidity, given the patient's comorbidity, including hypertension, hyperlipidemia.The differential diagnosis includes ACS vs Pneumothorax vs Reflux/Gastritis vs MSK pain vs Pneumonia vs other.  I felt PE was less likely given that patient has no tachycardia, no hypoxia, no acute PE risk factors  I ordered, reviewed, and interpreted labs.  Pertinent results include hypokalemia, no other emergent findings The patient was not requiring any medications for chest pain as he was pain-free on arrival I ordered  imaging studies which included x-ray of the chest I independently visualized and interpreted imaging which showed no emergent finding, incidental large hiatal hernia noted, and the monitor tracing which showed some this rhythm. I agree with the radiologist interpretation  Additional history was obtained from stepdaughter at bedside [facility])  External records obtained and reviewed showing last cardiology eval with Dr Antoine Poche on 04/24/19 - noting hx of CAD w/ PCI in 90's; no acute concerns otherwise.  I personally reviewed the patients ECG which shows left bundle branch block, which is new from his prior tracing in 2011, but otherwise no acute STEMI, does not meet Sgar[place]ossa criteria.  After the interventions stated above, I reevaluated the patient and found that they were stable and asymptomatic  Based on the patient's clinical exam, vital signs, risk factors, and ED testing, I felt that the patient's overall risk of life-threatening emergency such as ACS, PE, sepsis, or infection was low.  At this time, I felt the patient's presentation was most clinically consistent with nonspecific chest pain, but explained to the patient that this evaluation was not a definitive diagnostic workup.  I discussed outpatient follow up with primary care provider, and provided specialist office number on the patient's discharge paper if a referral was deemed necessary.  Return precautions were discussed with the patient.  I felt the patient was clinically stable for discharge.         Final Clinical Impression(s) / ED Diagnoses Final diagnoses:  Hypokalemia  Chest pain, unspecified type    Rx / DC Orders ED Discharge Orders          Ordered    potassium chloride (KLOR-CON) 10 MEQ tablet  Daily        10/01/22 1317    Magnesium Oxide 400 MG CAPS  Daily        10/01/22 1317              Terald Sleeper, MD 10/01/22 1319

## 2023-08-29 DEATH — deceased
